# Patient Record
Sex: Female | Born: 1950 | Race: White | Hispanic: No | Smoking: Never smoker
Health system: Southern US, Community
[De-identification: ages and names within clinical notes are randomized; demographics above are authoritative.]

## PROBLEM LIST (undated history)

## (undated) DIAGNOSIS — M797 Fibromyalgia: Secondary | ICD-10-CM

## (undated) DIAGNOSIS — Z22322 Carrier or suspected carrier of Methicillin resistant Staphylococcus aureus: Secondary | ICD-10-CM

## (undated) DIAGNOSIS — R112 Nausea with vomiting, unspecified: Secondary | ICD-10-CM

## (undated) DIAGNOSIS — K529 Noninfective gastroenteritis and colitis, unspecified: Secondary | ICD-10-CM

## (undated) DIAGNOSIS — M1990 Unspecified osteoarthritis, unspecified site: Secondary | ICD-10-CM

## (undated) DIAGNOSIS — K219 Gastro-esophageal reflux disease without esophagitis: Secondary | ICD-10-CM

## (undated) DIAGNOSIS — Z9889 Other specified postprocedural states: Secondary | ICD-10-CM

## (undated) HISTORY — PX: BILATERAL OOPHORECTOMY: SHX1221

## (undated) HISTORY — PX: CARPAL TUNNEL RELEASE: SHX101

## (undated) HISTORY — PX: TONSILLECTOMY: SUR1361

## (undated) HISTORY — PX: APPENDECTOMY: SHX54

## (undated) HISTORY — DX: Unspecified osteoarthritis, unspecified site: M19.90

## (undated) HISTORY — DX: Carrier or suspected carrier of methicillin resistant Staphylococcus aureus: Z22.322

## (undated) HISTORY — PX: BACK SURGERY: SHX140

## (undated) HISTORY — PX: ABDOMINAL HYSTERECTOMY: SHX81

## (undated) HISTORY — PX: HERNIA REPAIR: SHX51

## (undated) HISTORY — PX: OTHER SURGICAL HISTORY: SHX169

## (undated) HISTORY — PX: ROTATOR CUFF REPAIR: SHX139

## (undated) HISTORY — PX: CERVICAL FUSION: SHX112

---

## 1998-12-08 ENCOUNTER — Ambulatory Visit (HOSPITAL_COMMUNITY): Admission: RE | Admit: 1998-12-08 | Discharge: 1998-12-08 | Payer: Self-pay | Admitting: Specialist

## 1998-12-08 ENCOUNTER — Encounter: Payer: Self-pay | Admitting: Specialist

## 2001-03-05 ENCOUNTER — Encounter: Payer: Self-pay | Admitting: Family Medicine

## 2001-03-05 ENCOUNTER — Ambulatory Visit (HOSPITAL_COMMUNITY): Admission: RE | Admit: 2001-03-05 | Discharge: 2001-03-05 | Payer: Self-pay | Admitting: Family Medicine

## 2001-05-20 ENCOUNTER — Emergency Department (HOSPITAL_COMMUNITY): Admission: EM | Admit: 2001-05-20 | Discharge: 2001-05-20 | Payer: Self-pay | Admitting: Internal Medicine

## 2001-08-19 ENCOUNTER — Encounter: Payer: Self-pay | Admitting: Family Medicine

## 2001-08-19 ENCOUNTER — Ambulatory Visit (HOSPITAL_COMMUNITY): Admission: RE | Admit: 2001-08-19 | Discharge: 2001-08-19 | Payer: Self-pay | Admitting: Family Medicine

## 2001-08-21 ENCOUNTER — Ambulatory Visit (HOSPITAL_COMMUNITY): Admission: RE | Admit: 2001-08-21 | Discharge: 2001-08-21 | Payer: Self-pay | Admitting: Family Medicine

## 2001-08-24 ENCOUNTER — Encounter: Payer: Self-pay | Admitting: Family Medicine

## 2002-12-24 ENCOUNTER — Ambulatory Visit (HOSPITAL_COMMUNITY): Admission: RE | Admit: 2002-12-24 | Discharge: 2002-12-24 | Payer: Self-pay | Admitting: Family Medicine

## 2002-12-24 ENCOUNTER — Encounter: Payer: Self-pay | Admitting: Family Medicine

## 2003-05-12 ENCOUNTER — Inpatient Hospital Stay (HOSPITAL_COMMUNITY): Admission: RE | Admit: 2003-05-12 | Discharge: 2003-05-16 | Payer: Self-pay | Admitting: Specialist

## 2003-07-18 ENCOUNTER — Encounter: Admission: RE | Admit: 2003-07-18 | Discharge: 2003-07-18 | Payer: Self-pay | Admitting: Specialist

## 2003-07-26 ENCOUNTER — Inpatient Hospital Stay (HOSPITAL_COMMUNITY): Admission: RE | Admit: 2003-07-26 | Discharge: 2003-07-27 | Payer: Self-pay | Admitting: Specialist

## 2003-08-29 ENCOUNTER — Inpatient Hospital Stay (HOSPITAL_COMMUNITY): Admission: AD | Admit: 2003-08-29 | Discharge: 2003-09-06 | Payer: Self-pay | Admitting: Specialist

## 2003-09-27 ENCOUNTER — Encounter: Admission: RE | Admit: 2003-09-27 | Discharge: 2003-09-27 | Payer: Self-pay | Admitting: Internal Medicine

## 2003-09-28 ENCOUNTER — Ambulatory Visit (HOSPITAL_COMMUNITY): Admission: RE | Admit: 2003-09-28 | Discharge: 2003-09-28 | Payer: Self-pay | Admitting: Internal Medicine

## 2003-10-12 ENCOUNTER — Ambulatory Visit (HOSPITAL_COMMUNITY): Admission: RE | Admit: 2003-10-12 | Discharge: 2003-10-12 | Payer: Self-pay | Admitting: Specialist

## 2003-11-08 ENCOUNTER — Ambulatory Visit: Payer: Self-pay | Admitting: Internal Medicine

## 2003-12-28 ENCOUNTER — Ambulatory Visit (HOSPITAL_COMMUNITY): Admission: RE | Admit: 2003-12-28 | Discharge: 2003-12-28 | Payer: Self-pay | Admitting: Family Medicine

## 2004-01-17 ENCOUNTER — Ambulatory Visit: Payer: Self-pay | Admitting: Internal Medicine

## 2004-05-22 ENCOUNTER — Encounter: Admission: RE | Admit: 2004-05-22 | Discharge: 2004-05-22 | Payer: Self-pay | Admitting: Neurosurgery

## 2004-06-20 ENCOUNTER — Encounter: Admission: RE | Admit: 2004-06-20 | Discharge: 2004-06-20 | Payer: Self-pay | Admitting: Specialist

## 2004-07-11 ENCOUNTER — Ambulatory Visit: Payer: Self-pay | Admitting: Infectious Diseases

## 2004-07-11 ENCOUNTER — Inpatient Hospital Stay (HOSPITAL_COMMUNITY): Admission: EM | Admit: 2004-07-11 | Discharge: 2004-07-16 | Payer: Self-pay | Admitting: Specialist

## 2004-07-11 ENCOUNTER — Ambulatory Visit (HOSPITAL_COMMUNITY): Admission: RE | Admit: 2004-07-11 | Discharge: 2004-07-11 | Payer: Self-pay | Admitting: Specialist

## 2004-09-06 ENCOUNTER — Ambulatory Visit: Payer: Self-pay | Admitting: Internal Medicine

## 2004-09-25 ENCOUNTER — Ambulatory Visit: Payer: Self-pay | Admitting: Internal Medicine

## 2005-03-10 IMAGING — CT CT GUIDANCE NEEDLE PLACEMENT
2 of 6 series · 6 of 14 positions shown, 7 images · non-contrast
Comparison: Prior MRI of the lumbar spine performed at [REDACTED] dated 09/26/03.

CLINICAL DATA: The patient is status post lumbar fusion surgery complicated by Staph infection and has had subsequent incision and drainage and long-term antibiotic therapy.  Follow-up MRI has demonstrated persistent small fluid collection immediately posterior to the right iliac bone at the site of bone graft harvesting.  She now presents for aspiration of this region. 
 CT-GUIDED NEEDLE ASPIRATION OF SOFT TISSUE ADJACENT TO RIGHT ILIAC BONE 10/12/03

[Series 3: — · axial · 0.90mm/px · z∈[-452,-362]mm · 3 of 38 slices shown, 4 images (1 of 2)]
[im 10/38  soft-tissue]
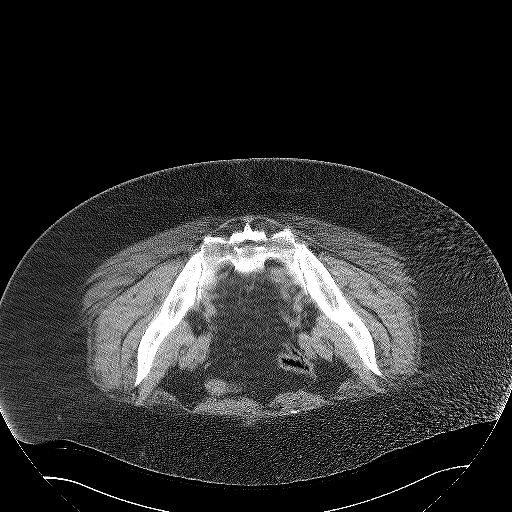
[im 10/38  bone]
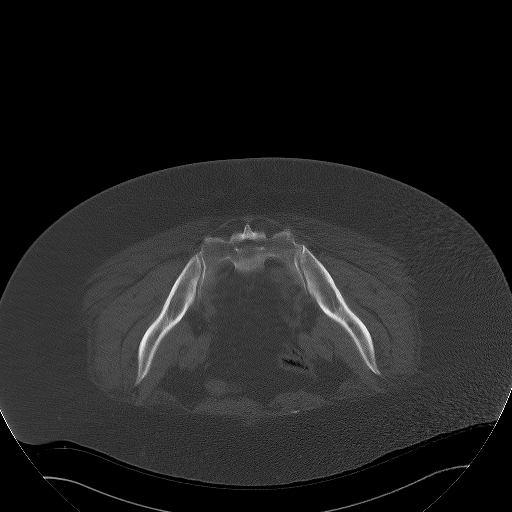
[im 19/38  bone]
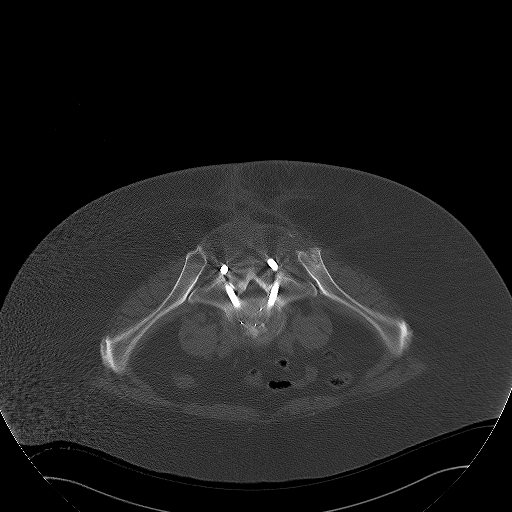
[im 28/38  bone]
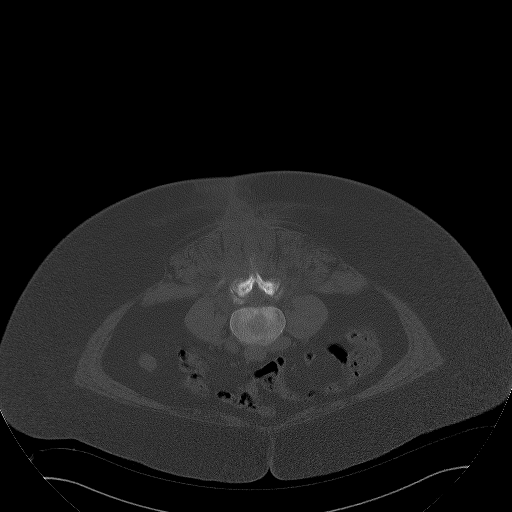

[Series 4: — · axial · 0.90mm/px · z∈[-452,-362]mm · 3 of 37 slices shown (2 of 2)]
[im 10/37  bone]
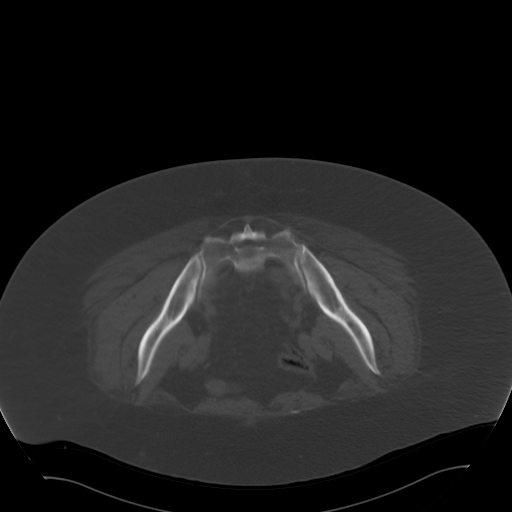
[im 19/37  bone]
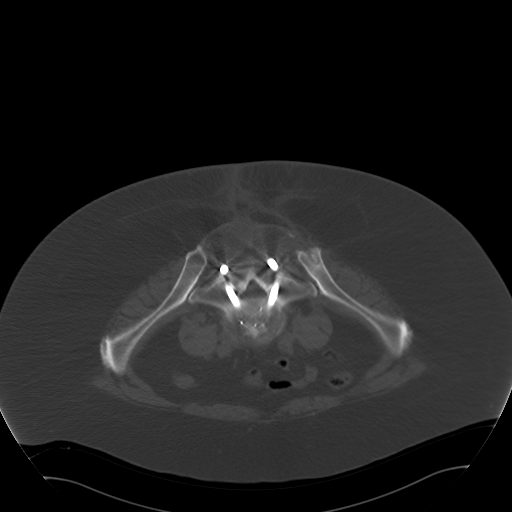
[im 28/37  bone]
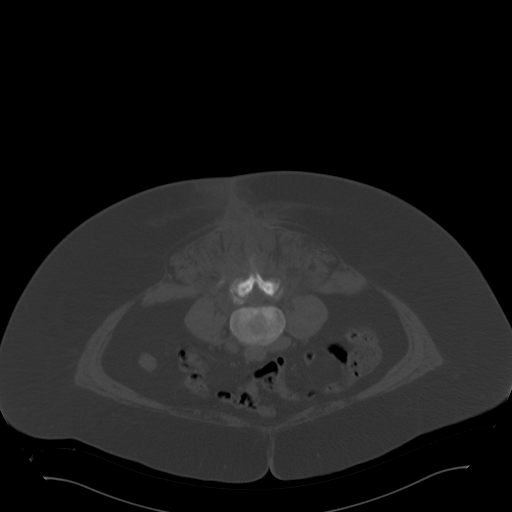

[6 of 14 positions shown; findings below may reference images not displayed]

The patient was placed in a prone position and preliminary unenhanced CT performed from the L3 level to the lower pelvis.  After choosing a site for the procedure, skin overlying the right posterior paraspinous region was sterilely prepped and draped.  Local anesthesia was provided with 1% lidocaine.  
 An 18 gauge trocar needle was advanced under CT guidance to the level of soft tissue abnormality just posterior to the right iliac bone.  After confirming needle tip position, aspiration was performed, and the needle was moved several times in this region during aspiration.  Saline lavage was then performed with 4 cc of sterile saline.  This yielded lavage of approximately 2 cc which was sent for culture and gram stain. 
 Complications:  None.
FINDINGS: Initial imaging shows a large amount of soft tissue scarring in the midline posterior to previous lumbar fusion at L5-S1 secondary to multiple surgeries.  Within this area of scarring, there is suggestion of a potentially tiny amount of residual fluid most likely representing postoperative seroma.  The largest dimensions of any visible potential fluid are only approximately 11 mm by CT and does appear smaller in size when compared to the MRI of 09/26/03. 
 At the level of the right iliac bone harvesting site, there is some soft tissue thickening and scarring present posteriorly.  On initial CT, there did not appear to be any significant fluid in this region, but given some residual asymmetric soft tissue thickening as well as prior MRI findings, aspiration was pursued.  With advancement of an 18 gauge needle into this area, there was no return of free fluid with aspiration.  Saline lavage was therefore performed. 
 IMPRESSION
 18 gauge needle aspiration at the level of asymmetric residual soft tissue inflammation posterior to the right iliac bone harvesting site was performed yielding no free fluid.  Saline lavage was performed and sample sent for culture.  The midline subcutaneous postoperative fluid has decreased in size since the 09/26/03 MRI with only tiny areas of residual fluid remaining measuring just over 1 cm in maximum diameter.

## 2008-06-20 ENCOUNTER — Ambulatory Visit (HOSPITAL_COMMUNITY): Admission: RE | Admit: 2008-06-20 | Discharge: 2008-06-20 | Payer: Self-pay | Admitting: Surgery

## 2008-06-30 ENCOUNTER — Ambulatory Visit (HOSPITAL_COMMUNITY): Admission: RE | Admit: 2008-06-30 | Discharge: 2008-06-30 | Payer: Self-pay | Admitting: Surgery

## 2008-07-13 ENCOUNTER — Encounter: Admission: RE | Admit: 2008-07-13 | Discharge: 2008-07-13 | Payer: Self-pay | Admitting: Surgery

## 2008-09-26 ENCOUNTER — Encounter: Admission: RE | Admit: 2008-09-26 | Discharge: 2008-12-25 | Payer: Self-pay | Admitting: Surgery

## 2008-12-12 ENCOUNTER — Ambulatory Visit (HOSPITAL_COMMUNITY): Admission: RE | Admit: 2008-12-12 | Discharge: 2008-12-12 | Payer: Self-pay | Admitting: Surgery

## 2008-12-12 HISTORY — PX: LAPAROSCOPIC GASTRIC BANDING: SHX1100

## 2008-12-26 ENCOUNTER — Encounter: Admission: RE | Admit: 2008-12-26 | Discharge: 2009-03-01 | Payer: Self-pay | Admitting: Surgery

## 2009-02-14 ENCOUNTER — Ambulatory Visit (HOSPITAL_COMMUNITY): Admission: RE | Admit: 2009-02-14 | Discharge: 2009-02-14 | Payer: Self-pay | Admitting: Surgery

## 2009-11-21 ENCOUNTER — Inpatient Hospital Stay (HOSPITAL_COMMUNITY): Admission: EM | Admit: 2009-11-21 | Discharge: 2009-11-22 | Payer: Self-pay | Admitting: Emergency Medicine

## 2009-11-27 ENCOUNTER — Emergency Department (HOSPITAL_COMMUNITY): Admission: EM | Admit: 2009-11-27 | Discharge: 2009-11-27 | Payer: Self-pay | Admitting: Emergency Medicine

## 2009-12-04 ENCOUNTER — Encounter: Admission: RE | Admit: 2009-12-04 | Discharge: 2009-12-04 | Payer: Self-pay | Admitting: Surgery

## 2009-12-19 ENCOUNTER — Ambulatory Visit (HOSPITAL_COMMUNITY): Admission: RE | Admit: 2009-12-19 | Discharge: 2009-12-19 | Payer: Self-pay | Admitting: Surgery

## 2010-01-02 ENCOUNTER — Inpatient Hospital Stay (HOSPITAL_COMMUNITY): Admission: AD | Admit: 2010-01-02 | Discharge: 2010-01-06 | Payer: Self-pay | Admitting: Surgery

## 2010-01-02 HISTORY — PX: OTHER SURGICAL HISTORY: SHX169

## 2010-01-04 ENCOUNTER — Encounter: Admission: RE | Admit: 2010-01-04 | Payer: Self-pay | Admitting: Surgery

## 2010-03-24 ENCOUNTER — Encounter: Payer: Self-pay | Admitting: Surgery

## 2010-05-15 LAB — COMPREHENSIVE METABOLIC PANEL
AST: 22 U/L (ref 0–37)
Albumin: 3.6 g/dL (ref 3.5–5.2)
Alkaline Phosphatase: 69 U/L (ref 39–117)
Chloride: 108 mEq/L (ref 96–112)
GFR calc Af Amer: >60 mL/min (ref >60)
Potassium: 3.9 mEq/L (ref 3.5–5.1)
Total Bilirubin: 1.1 mg/dL (ref 0.3–1.2)

## 2010-05-15 LAB — CBC
HCT: 39.5 % (ref 36.0–46.0)
Hemoglobin: 13 g/dL (ref 12.0–15.0)
MCH: 30.7 pg (ref 26.0–34.0)
MCH: 30.8 pg (ref 26.0–34.0)
MCHC: 33.9 g/dL (ref 30.0–36.0)
MCHC: 34.3 g/dL (ref 30.0–36.0)
Platelets: 176 10*3/uL (ref 150–400)
Platelets: 216 10*3/uL (ref 150–400)
RBC: 4.48 MIL/uL (ref 3.87–5.11)
RDW: 13.7 % (ref 11.5–15.5)
RDW: 13.7 % (ref 11.5–15.5)
WBC: 8.1 10*3/uL (ref 4.0–10.5)

## 2010-05-15 LAB — DIFFERENTIAL
Basophils Absolute: 0 10*3/uL (ref 0.0–0.1)
Basophils Absolute: 0 10*3/uL (ref 0.0–0.1)
Basophils Absolute: 0 10*3/uL (ref 0.0–0.1)
Basophils Relative: 0 % (ref 0–1)
Basophils Relative: 0 % (ref 0–1)
Basophils Relative: 0 % (ref 0–1)
Eosinophils Absolute: 0 10*3/uL (ref 0.0–0.7)
Eosinophils Absolute: 0.2 10*3/uL (ref 0.0–0.7)
Eosinophils Relative: 1 % (ref 0–5)
Lymphocytes Relative: 34 % (ref 12–46)
Monocytes Absolute: 0.5 10*3/uL (ref 0.1–1.0)
Monocytes Absolute: 0.5 10*3/uL (ref 0.1–1.0)
Monocytes Relative: 6 % (ref 3–12)
Monocytes Relative: 7 % (ref 3–12)
Neutro Abs: 5.6 10*3/uL (ref 1.7–7.7)
Neutro Abs: 9.2 10*3/uL — ABNORMAL HIGH (ref 1.7–7.7)
Neutrophils Relative %: 68 % (ref 43–77)

## 2010-05-16 LAB — SURGICAL PCR SCREEN
MRSA, PCR: NEGATIVE
Staphylococcus aureus: POSITIVE — AB

## 2010-05-17 LAB — DIFFERENTIAL
Basophils Absolute: 0 10*3/uL (ref 0.0–0.1)
Basophils Absolute: 0 10*3/uL (ref 0.0–0.1)
Basophils Absolute: 0 10*3/uL (ref 0.0–0.1)
Basophils Relative: 0 % (ref 0–1)
Basophils Relative: 0 % (ref 0–1)
Eosinophils Relative: 7 % — ABNORMAL HIGH (ref 0–5)
Lymphocytes Relative: 13 % (ref 12–46)
Lymphocytes Relative: 22 % (ref 12–46)
Lymphs Abs: 1.6 10*3/uL (ref 0.7–4.0)
Monocytes Absolute: 0.8 10*3/uL (ref 0.1–1.0)
Monocytes Relative: 6 % (ref 3–12)
Neutro Abs: 4.4 10*3/uL (ref 1.7–7.7)
Neutro Abs: 5.4 10*3/uL (ref 1.7–7.7)
Neutro Abs: 8.9 10*3/uL — ABNORMAL HIGH (ref 1.7–7.7)
Neutrophils Relative %: 48 % (ref 43–77)
Neutrophils Relative %: 55 % (ref 43–77)

## 2010-05-17 LAB — URINALYSIS, ROUTINE W REFLEX MICROSCOPIC
Bilirubin Urine: NEGATIVE
Bilirubin Urine: NEGATIVE
Ketones, ur: 15 mg/dL — AB
Nitrite: NEGATIVE
Nitrite: NEGATIVE
Protein, ur: NEGATIVE mg/dL
Protein, ur: NEGATIVE mg/dL
Urobilinogen, UA: 0.2 mg/dL (ref 0.0–1.0)

## 2010-05-17 LAB — CBC
HCT: 33.1 % — ABNORMAL LOW (ref 36.0–46.0)
HCT: 34.6 % — ABNORMAL LOW (ref 36.0–46.0)
HCT: 40 % (ref 36.0–46.0)
Hemoglobin: 11.4 g/dL — ABNORMAL LOW (ref 12.0–15.0)
MCHC: 33.8 g/dL (ref 30.0–36.0)
MCHC: 34.5 g/dL (ref 30.0–36.0)
MCV: 89 fL (ref 78.0–100.0)
Platelets: 200 10*3/uL (ref 150–400)
Platelets: 222 10*3/uL (ref 150–400)
RBC: 4.5 MIL/uL (ref 3.87–5.11)
RDW: 13.3 % (ref 11.5–15.5)
RDW: 13.4 % (ref 11.5–15.5)
WBC: 11.9 10*3/uL — ABNORMAL HIGH (ref 4.0–10.5)
WBC: 8.1 10*3/uL (ref 4.0–10.5)

## 2010-05-17 LAB — BASIC METABOLIC PANEL
BUN: 17 mg/dL (ref 6–23)
BUN: 7 mg/dL (ref 6–23)
CO2: 25 mEq/L (ref 19–32)
Calcium: 8.5 mg/dL (ref 8.4–10.5)
Calcium: 8.5 mg/dL (ref 8.4–10.5)
Chloride: 104 mEq/L (ref 96–112)
Chloride: 112 mEq/L (ref 96–112)
Creatinine, Ser: 0.71 mg/dL (ref 0.4–1.2)
Creatinine, Ser: 0.95 mg/dL (ref 0.4–1.2)
GFR calc Af Amer: >60 mL/min (ref >60)
GFR calc Af Amer: >60 mL/min (ref >60)
GFR calc non Af Amer: >60 mL/min (ref >60)
GFR calc non Af Amer: >60 mL/min (ref >60)
Glucose, Bld: 117 mg/dL — ABNORMAL HIGH (ref 70–99)
Glucose, Bld: 94 mg/dL (ref 70–99)
Potassium: 3.8 mEq/L (ref 3.5–5.1)
Potassium: 3.8 mEq/L (ref 3.5–5.1)
Sodium: 142 mEq/L (ref 135–145)

## 2010-05-17 LAB — PHOSPHORUS: Phosphorus: 3 mg/dL (ref 2.3–4.6)

## 2010-05-17 LAB — LIPID PANEL
Cholesterol: 180 mg/dL (ref 0–200)
LDL Cholesterol: 114 mg/dL — ABNORMAL HIGH (ref 0–99)
Triglycerides: 72 mg/dL (ref <150)

## 2010-05-17 LAB — STOOL CULTURE

## 2010-05-17 LAB — CLOSTRIDIUM DIFFICILE EIA

## 2010-05-17 LAB — OVA AND PARASITE EXAMINATION

## 2010-05-17 LAB — MAGNESIUM
Magnesium: 1.6 mg/dL (ref 1.5–2.5)
Magnesium: 1.8 mg/dL (ref 1.5–2.5)

## 2010-05-17 LAB — HEPATIC FUNCTION PANEL
ALT: 14 U/L (ref 0–35)
AST: 19 U/L (ref 0–37)
Alkaline Phosphatase: 102 U/L (ref 39–117)
Total Protein: 6.6 g/dL (ref 6.0–8.3)

## 2010-05-17 LAB — URINE CULTURE

## 2010-05-17 LAB — TSH: TSH: 1.546 u[IU]/mL (ref 0.350–4.500)

## 2010-06-05 LAB — ANAEROBIC CULTURE

## 2010-06-05 LAB — WOUND CULTURE

## 2010-06-07 LAB — COMPREHENSIVE METABOLIC PANEL
ALT: 29 U/L (ref 0–35)
Albumin: 4.2 g/dL (ref 3.5–5.2)
Alkaline Phosphatase: 62 U/L (ref 39–117)
Potassium: 3.9 mEq/L (ref 3.5–5.1)
Sodium: 140 mEq/L (ref 135–145)
Total Protein: 7.4 g/dL (ref 6.0–8.3)

## 2010-06-07 LAB — DIFFERENTIAL
Basophils Relative: 1 % (ref 0–1)
Eosinophils Absolute: 0 10*3/uL (ref 0.0–0.7)
Monocytes Absolute: 0.4 10*3/uL (ref 0.1–1.0)
Monocytes Relative: 5 % (ref 3–12)

## 2010-06-07 LAB — CBC
Hemoglobin: 14.4 g/dL (ref 12.0–15.0)
Platelets: 216 10*3/uL (ref 150–400)
RDW: 13.9 % (ref 11.5–15.5)

## 2010-07-20 NOTE — Discharge Summary (Signed)
NAMETAGEN, BRETHAUER              ACCOUNT NO.:  [id number]   MEDICAL RECORD NO.:  [id number]          PATIENT TYPE:  INP   LOCATION:  1513                         FACILITY:  [facility]   PHYSICIAN:  Kerrin Champagne, M.D.   DATE OF BIRTH:  [birth year]   DATE OF ADMISSION:  07/11/2004  DATE OF DISCHARGE:  07/16/2004                                 DISCHARGE SUMMARY   ADMISSION DIAGNOSES:  1.  Abscess left L5-S1 level, possible osteomyelitis, right L4-5 was sent  2.  Pyarthrosis, left shoulder..  3.  Diet-controlled diabetes mellitus.  4.  Remote history anterior cervical diskectomy and fusion C7-T1.  5.  Bilateral epidural condyle release of bilateral elbows.  6.  Right rotator cuff repair in 2002.  7.  Left shoulder arthroscopy in 2006.   DISCHARGE DIAGNOSES:  1.  Methicillin-resistant Staphylococcus aureus infection at the L5-S1      level.  2.  Pyarthrosis, left shoulder, with methicillin-resistant Staphylococcus      aureus.  3.  Diet-controlled diabetes mellitus.  4.  Remote history of anterior cervical diskectomy and fusion C7-T1.  5.  Bilateral epidural condyle release of bilateral elbows.  6.  Right rotator cuff repair in 2002.  7.  Left shoulder arthroscopy in 2006.   PROCEDURES:  1.  On Jul 12, 2004, the patient underwent incision irrigation and      excisional debridement of L5-S1 abscess with removal of loosened      hardware pedicle screws and rods at the L5-S1 level.  This was performed      by Dr. Otelia Sergeant, assisted by Maud Deed, PA-C under general anesthesia.  2.  On Jul 12, 2004, by Dr. Rennis Chris, assisted by Shelbie Proctor, PA-C,      includes left shoulder glenohumeral joint diagnostic arthroscopy as well      as arthroscopic lavage and synovectomy of left shoulder glenohumeral      joint.  Arthroscopic subacromial bursectomy with debridement of rotator      cuff tear under general anesthesia as well.   HISTORY OF PRESENT ILLNESS:  Ms. 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  Her postoperative course was complicated with  wound dehiscence and fat necrosis treated with initial frequent dressing  changes and eventually excision and closure.  The patient developed an  epidural abscess which was drained and did grow methicillin-resistant  Staphylococcus aureus.  The patient was treated again with incision,  irrigation and debridement with closure of her drains and use of IV  antibiotics.  Unfortunately, she developed a reaction to IV vancomycin and  was unable to use this antibiotic.  She was treated with doxycycline and  rifampin.  Abscess was felt to be eradicated.  In the interval, she also  underwent a left shoulder procedure for debridement of distal clavicle and  rotator cuff repair arthroscopically by Dr. Rennis Chris in February 2006.  Over  the past 6 weeks, the patient has developed increasing low back pain as well  as elevated sedimentation rate rising as high  as 90.  MRI studies have  demonstrated facet changes at the right L4-L5 level possibly consistent with  osteomyelitis and myelitis were facet pyarthrosis. Attempts at aspiration  were unsuccessful and the cultures were negative.  Over the past 7-10 days,  the patient has had increasing left shoulder pain.  In the office, on the  day of admission, she was noted to have increasing pain of the left shoulder  with flexion once. She underwent aspiration of the left shoulder and  purulent material was aspirated.  She was admitted immediately to the  hospital for a IV antibiotic treatment and irrigation and debridement of the  shoulder as well as the lumbar spine.   HOSPITAL COURSE:  Upon admission, the patient was taken to the operating  room that was the procedure as stated above.  The patient did have MRI scan  of the left shoulder prior to her surgical intervention.  Both procedures  left drains  sewn into the affected areas.  On postop day #1, the patient was  noted to have a decreased hemoglobin to 8.6 and did require 2 units of  packed red blood cells.  She responded nicely to the transfusion with no  complications.  Chemistry studies were intact postoperatively.  Infectious  disease consult was obtained and the patient was seen by Dr. Roxan Hockey.  Doxycycline with rifampin x1 year was recommended.  Through the next couple  of days, her drain was monitored closely.  The drain from the left shoulder  was discontinued on May 13, as there was no further drainage.  The drain of  the back remained intact until May 15, at which time it was removed as well.  Dressing changes were done daily both of the shoulder and of the lumbar  spine incision.  The patient's antibiotics were monitored by the infectious  disease team and the patient tolerated the combination of doxycycline and  rifampin without complications during the hospital stay.  On Jul 16, 2004,  the patient was comfortable.  She was ambulating in hallway.  The patient  did have low-grade fevers during the hospital stay, but these were felt to  be stable.  Her antibiotics were changed to oral doxycycline 1 mg b.i.d. and  rifampin 300 mg b.i.d.  She was felt to be stable for discharge to her home  on Jul 16, 2004.   LABORATORY DATA AND X-RAY FINDINGS:  Admission CBC with hemoglobin 9.4,  hematocrit 28.1.  Admission white blood count 12.5.  On May 15, at  discharge, WBC 11.4, hemoglobin 9.8, hematocrit 29.2.  Sedimentation rate on  May 10, was 120.  Coagulation study on admission normal with exception of  mildly elevated PTT of 39.  Chemistry studies throughout the hospital stay  were found to be normal with the exception of admission, albumin of 2.4.  Blood sugars ranged from 105-163 during the hospital stay.  Urinalysis on  admission showed moderate leukocyte esterase, few epithelial cells, 7-10 wbc's and few bacteria.  Yeast also  noted in the urinalysis.  Cultures from  the lumbar spine grew methicillin-resistant Staphylococcus aureus.  This  particular culture came from the pedicle at L5.  Remaining cultures showed  no growth.  No anaerobes were isolated.  The patient received a total of 2  units of packed red blood cells during the hospital stay.   Admission EKG was within normal limits.  MRI scan of left shoulder with  recurrent full-thickness tear of the supraspinatus tendon with no signs of  osteomyelitis.  Infected fluid in the left glenohumeral joint freely  communicating with fluid in the subacromial subdeltoid bursa noted.  Fluid  was noted to extend and through the defect in the distal clavicle and small  soft tissue abscess superior to the [facility] joint.  MRI of lumbar spine with end-  plate edema and irregularity at the L5-S1 have progressed from the patient's  prior MRI of the August 29, 2003, but did not appear obviously changed from  the CT scan of 6 weeks prior to this study.  This was felt to be related to  ongoing infection.  Chest x-ray on admission showed mild cardiomegaly, small  left pleural effusion.  X-ray of lumbar spine on the day of discharge showed  ill definition of the vertebral end-plates of W1-X9 level, associated mild  anterolisthesis L5-S1.   DISPOSITION:  Arrangements were made for the patient to be discharged to  home today.   DISCHARGE MEDICATIONS:  1.  Percocet 5/325 one to two every 4-6 hours as needed for pain.  2.  Robaxin 500 mg every 8 hours as needed for spasm.  3.  Cymbalta 40 mg 1 tablet q.h.s.  4.  Doxycycline 100 mg one p.o. q.12h.  5.  Rifampin 300 mg one p.o. q.12h.   WOUND CARE:  The patient will change her dressing of the lumbar spine in  approximately 2-3 days and will be allowed to shower when there is no  drainage.   DIET:  She will remain on low carbohydrate diet.   FOLLOW UP:  The patient will follow up in Dr. Barbaraann Faster office in 4-5 days and  has been  advised to call to make the appointment.  She will also follow up  with Dr. supple approximately 10-14 days for recheck.   CONDITION ON DISCHARGE:  Stable.       SMV/MEDQ  D:  09/10/2004  T:  09/10/2004  Job:  [id number]

## 2010-07-20 NOTE — Op Note (Signed)
NAME:  Natalie Dodson, Natalie Dodson                        ACCOUNT NO.:  [id number]   MEDICAL RECORD NO.:  [id number]                   PATIENT TYPE:  INP   LOCATION:  3027                                 FACILITY:  [facility]   PHYSICIAN:  Kerrin Champagne, M.D.                DATE OF BIRTH:  Jul 08, 1950   DATE OF PROCEDURE:  DATE OF DISCHARGE:                                 OPERATIVE REPORT   DATE OF OPERATION:  September 02, 2003.   PREOPERATIVE DIAGNOSIS:  Epidural abscess, L5-S1, status post incision,  irrigation, and debridement x2.   POSTOPERATIVE DIAGNOSIS:  Epidural abscess, L5-S1, status post incision,  irrigation, and debridement x2.   PROCEDURE:  Repeat irrigation and debridement of L5-S1 epidural abscess with  delayed primary closure over 15-French Hemovac drain.   SURGEON:  Vira Browns, MD   ASSISTANT:  Maud Deed, PA-C.   ANESTHESIA:  GOT, Dr. Diamantina Monks.   ESTIMATED BLOOD LOSS:  100 mL.   DRAINS:  Two 15-French drains to Hemovac reservoir.   HISTORY OF PRESENT ILLNESS:  The patient is a 60 year old female, who  developed progressive worsening night fever and chills over the past 2  weeks.  She presented to our office on Monday, 5 days ago, with fever of  103, complaining of back pain with radiation to the buttocks and thighs.  She is status post L5-S1 decompression and fusion in March of this year.  She had wound dehiscence postoperatively, was treated with dressing changes  locally, and returned to the operating room at some five weeks post-dressing  changes and had a primary wound excision and closure.  Following closure,  she went on to heal the incision and returned to work 1 week prior to her  presentation with increased fevers.  Was seen in the office, evaluated, and  felt to most likely have an infection at the site of her surgery.  She was  admitted and underwent MRI scan, which demonstrated an epidural abscess at  the L5-S1 level and some reactive changes extending all  the way up to L2.  The patient underwent immediate irrigation and debridement that evening.  Her sedimentation rate was 80.  Postoperatively, she defervesced and has  shown diminished thigh and leg discomfort.  She was returned to the  operating room 2 days later and underwent a repeat irrigation and  debridement and was found to have another small area of purulence, so the  wound was left open and packed, and returns today for a second repeat  irrigation, debridement, and possible irrigation and then closure over  drains.   INTRAOPERATIVE FINDINGS:  No significant areas of purulence.  There is a  small area potentially over the right side L5-S1, but nothing as compared to  her previous incision's irrigation and debridement.  She therefore underwent  irrigation and debridement locally and then closure over drains.   DESCRIPTION OF PROCEDURE:  After adequate general  anesthesia, the patient  rolled into a prone position on chest rolls, all pressure points well  padded.  Standard prep with Betadine scrub and prep solution and draped in  the usual manner.  Iodine Vi-Drape was used.  The patient is on Vancomycin  for methicillin-resistance staph aureus abscess.  She had opening of the  incision widely.  A McCullough retractor was inserted.  Under loop  magnification then, the areas surrounding the thecal sac at the L5-S1 level  were carefully opened and exposed using a D'Errico retractor, retracting the  nerve root, thecal sac, and examining the L5-S1 level where a small amount  of purulence noted on the right side, perhaps a cubic centimeter at the most  noted.  This was quickly irrigated.  The left side thecal sac evaluated and  showed no sign of purulence, and this was exposed all the way down to the L5-  S1 disk space on the left.  Irrigation and debridement was carried out, with  small areas of loose debris removed from the incision, which amounted to  just a small amount of debris.   Some taken from the region of the right  sacral pedicle just inferior to it where there was some small amount of  discolored, soft tissue, which was debrided using a bare rongeur.  Irrigation was performed.  Overall, the soft tissue showed granulation that  was quite nice and almost 99 or 97% granulated throughout the wound.  Irrigation was performed to the tune of 3 liters of lactated Ringer's  solution.  Additionally, some 250 mL of double antibiotic solution was used  to irrigate the incision.  #10-French Hemovac drains were then placed over  the right iliac crest entering into the lumbar area, and these were passed  on the right and left side of the spinous process of L3-L4 and over the  laminotomy defect at L5-S1.  The paralumbar muscles were then approximated  loosely over the L5-S1 level using interrupted #1 Vicryl sutures.  The  lumbodorsal fascia approximated loosely over the midline from L3 to S1.  Then the deep fatty layers approximated loosely with interrupted #1 Vicryl  sutures,  the more superficial layers with interrupted #0 Vicryl sutures,  and then the skin was closed with interrupted vertical mattress sutures of 2-  0 Prolene.  Adaptic, 4x4s, ABD pads affixed to the skin with paper tape.  The 2 drains on the right side were then sewn in place using 2-0 nylon  suture.  The patient was then returned to a supine position, reactivated,  extubated, and returned to the recovery room in satisfactory condition.  All  instrument and sponge counts were correct.                                               Kerrin Champagne, M.D.    Myra Rude  D:  09/02/2003  T:  09/04/2003  Job:  [id number]

## 2010-07-20 NOTE — Op Note (Signed)
NAME:  Natalie Dodson, Natalie Dodson                        ACCOUNT NO.:  [id number]   MEDICAL RECORD NO.:  [id number]                   PATIENT TYPE:  INP   LOCATION:  5004                                 FACILITY:  [facility]   PHYSICIAN:  Kerrin Champagne, M.D.                DATE OF BIRTH:  [birth year]   DATE OF PROCEDURE:  05/12/2003  DATE OF DISCHARGE:                                 OPERATIVE REPORT   PREOPERATIVE DIAGNOSES:  1. Painful spondylolisthesis at L5-S1.  2. 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  3. Right carpal tunnel syndrome.   POSTOPERATIVE DIAGNOSES:  1. 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  2. Bilateral lateral recess stenosis, moderately severe, at the L4-5 level     affecting the L5 nerve roots.  3. Right carpal tunnel syndrome with median nerve entrapment secondary to     hypertrophic changes involving the patient's transverse carpal ligament.   SURGEON:  Kerrin Champagne, M.D.   ANESTHESIA:  General anesthesia.   HISTORY OF PRESENT ILLNESS:  The patient is a 60 year old female who has had  previous cervical spine surgery for a herniated nucleus pulposus at the C5-  C6 level.  She has had increasing pain into her hands.  She underwent  evaluation in early fall with studies indicating multilevel foraminal  narrowing involving the spinal canal most prominent at the C7-T1 level.  Her  pain distribution, however, is significant at the median nerve distribution  or C6 level.  The level of previous C5-6 fusion, however, demonstrated some  very mild left sided uncovertebral findings.  Her pain was primarily right  sided.  The patient underwent EMG's and nerve conduction studies which  returned demonstrating mild carpal tunnel syndrome on the right side.  This  was treated conservatively with splinting and anti-inflammatory  agents with  persistent night pain typical to her recurring carpal tunnel complaints in  the right Bink.  While being treated for her neck and her carpal tunnel, the  patient developed severe back pain, claudication and difficulty with  standing and walking any distance.  She underwent evaluation including an  MRI study which demonstrated a grade 1 spondylolisthesis at L5-S1 and  degenerative disk change above this segment involving the L2-3, L1-2,  L3-4  and L4-5 levels. She was found to have significant lateral recess stenosis  at L4-5 and was also found to have L5 nerve root entrapment secondary to an  isthmic spondylolisthesis and entrapment within the foramen here.  Because  of persistent pain, the inability to relieve her leg pain and the  persistence of night pain, the necessity of narcotics to help sleep and  failure of conservative management including selective nerve root block to  relieve her pain for any significant  length of time, she is brought to the  operating room to undergo surgical treatment for the isthmic  spondylolisthesis at L5-S1 with decompression of the lateral recess stenosis  at the L4-5 level.  The patient also is to undergo carpal tunnel release as  she is undergoing a general anesthetic and prefers to undergo this surgery  at this time.   INTRAOPERATIVE FINDINGS:  The patient is found to have significant  hypertrophic changes involving the transverse carpal ligament of the right  wrist affecting the right median nerve causing an hourglass deformity and  decompression of the nerve at the level of the carpal canal.  The patient  within the lumbar spine was found to have significant grade 1  spondylolisthesis at L5-S1 with bilateral L5 nerve root entrapment, right  side greater than left, secondary to hypertrophic changes occurring at the  level of the pars defect and bilateral lateral recess stenosis affecting the  L5 nerve root at the L4 level, right side  greater than left, as well.   DESCRIPTION OF PROCEDURE:  After adequate general anesthesia the patient in  a prone position, chest rolls on the [place] table, all pressure points well  padded, the legs wrapped and monitors placed for intraoperative monitoring  of the EMG's as well as for tissue resistance monitoring with insertion of  pedicle screws.  The patient was found not to have her MRI study available  so that surgery was first proceeded upon with the right upper extremity  while attempts were made to locate the patient's MRI studies which were at  [facility].  These were eventually found, located and brought to  the operating room shortly after performance of carpal tunnel release.  The  right upper extremity was prepped with Duraprep solution and the right Kosik  and fingertips to the right elbow, a tourniquet about the right upper arm  and draped in the usual manner.  Duraprep was used for prep here.  Standard  preoperative antibiotics with Ancef. An incision approximately an inch and a  half in length, 2 mm ulnar to the thenar imminence crease, crossing at the  distal wrist creases obliquely.  Through the skin and subcutaneous layer it  was carried down to the level of the transverse carpal ligament distally and  the distal portion fo the forearm fascia.  The distal foreman fascia was  incised using the sharp scissors and the median nerve identified using  retraction of the skin, double ended retractors and Ragnell retractors.  A  Freer elevator was placed beneath the transverse carpal ligament and the  distal volar forearm fascia was then incised and carried to the level fo the  transverse carpal ligament palpating the carpal canal.  The extent of the  canal was identified and an incision made directly across the transverse  carpal ligament overlying the Therapist, nutritional.  This was done releasing the transverse carpal ligament and noting the impression on the underlying   median nerve.  Scissors were then used to continue the dissection incising  the transverse carpal ligament out to the level of the palmar fascia and  this was carried out to the level of the superficial arch of the Mcmichael.  Elevating the skin proximally, then the distal portion of the forearm fascia  was incised underneath the skin edges upwards to an area where no further  compression was noted on the median nerve.  The tourniquet was then  released.  Bleeders were controlled using bipolar electrocautery.  The  patient was noted to have a small amount of oozing from soft tissue present  so that a 7 [place] TLS drain was placed.  The skin edges were then closed  with interrupted horizontal mattress sutures of 4-0 nylon.  Adaptic and 4 x  4's were fixed to the skin with sterile Webril and the ace wrap was then  applied.  The TLS drain charged and the patient placed into a forearm  splint.  Carefully, the elbow and the patient's shoulder were evaluated  following the surgery as the surgery was performed with the patient in full  internal rotation of the right arm as she was in a prone position to allow  for carpal tunnel release.  Examination of the shoulder and elbow post  carpal tunnel release demonstrated no abnormality.  Note that a tourniquet  was used during the procedure.  The arm was elevated and exsanguinated with  an Esmarch bandage and the tourniquet inflated to 250 mmHg.  This remained  so for 17 minutes during the case.  The tourniquet was removed in its  entirety at the end of the case.   Attention was then turned to the patient's lumbar spine.  Duraprep was used  to prep from the lower dorsal spine to the S3 level, draped in the usual  manner with an iodine Vi-Drape used.  The MRI scans were received from [facility] and these were transmitted digitally and studies were used to  evaluate the pedicles appropriately intraoperatively.  An incision was made  extending from  about L3 to S2 in the midline through the deep and  subcutaneous layers, a deep incision, quite a bit of adipose present down to  the lumbodorsal fascia.  This was then incised on both sides of the spinous  process of S1, L5, L4 and L3.  Two Cobbs were used to elevate the paralumbar  muscle bilaterally off the posterior aspect of the spinous process and  lamina of L5, S1, S2, L4 and partly at L3.  The incision was carried out  with exposure out over the facets at the L5-S1 level performing  capsulotomies at both sides.  Using electrocautery this was carried out to  the sacral ala on both sides.  It was then carried over the lateral aspects  of the facet at the L4-5 level preserving the facet capsule of this segment  bilaterally exposing the transverse process of L5 bilaterally.  Carefully  the soft tissue was released bilaterally to allow for enough lateral exposure to allow for convergence of screws with screw placement for pedicle  screws at L5 and S1 bilaterally.  McCullough retractors were used initially.  Following the application fo the McCullough retractors then the Viper  retractors were placed. Bone graft was harvested from the right iliac crest  through a subcutaneous approach to the right iliac crest. A separate fascial  incision was made over the crest and bone graft then harvested from the  right posterior superior iliac spine using curved and straight osteotomes  and gouges.  When enough bone graft had been harvested that was felt to be  enough, Gelfoam was placed over the iliac crest bone graft site and the  central incision reopened.  Viper retractors were replaced.  A Leksell  rongeur was used to divide the interspinous ligaments between L5 and L4  above and L5 and S1 below.  A large curette was used to help loosen the  attachments to the ligamentum flavum to  the undersurface of the L5 lamina  inferiorly.  With this then the entire neural arch of L5 was able to be  lifted  off free as it was quite loose secondary to the bilateral isthmic  defects.  At this point attention was turned to placement of the pedicle  screws as bone graft was available for bone grafting of the posterolateral  regions with entry of screws.  First on the left side the pedicle screws  were placed at S1 using a Leksell rongeur to remove a portion of the  posterior aspect of the superior articular process of S1 and an awl was then  used to perform an opening into the cortex just lateral to the superior  articular process of S1 over its inferior extent.  A gear shift pedicle  finder was then used and the appropriate degree of convergence oriented  parallel to the end plate of S1 observed on C-arm fluoroscopy to be in the  correct position and alignment.  Note the C-arm fluoroscopy was also used at  the beginning of the case for localization fo the L5 level with a marker in  the L5-S1 facet.  The pedicle screw at the S1 level on the left side was  placed first following pedicle finding verifying on radiographs and then  palpating the pedicle hole with a ball tipped probe, tapping with a 6.25 tap  and then placing a 40 mm, 7.0 screw on the left side at S1.  This was done  without difficulty.  Radiographs and fluoroscopy with the C-arm demonstrated  the screw to be in good position and alignment.  Attention was then turned  to the left L5 level and the junction of the transverse process, pedicle and  superior articular process of L5 all were able to be easily evaluated and  were quite well visualized. An awl was used to perform entry at the  intersection at the base of the transverse process with the superior  articular process of L5.  Then a Falor held pedicle finder was then used to  carefully probe the pedicle chamber.  This was observed on C-arm fluoroscopy  to be in good position and alignment.  A ball tipped probe was used to probe the channel on this left side demonstrating patency of  the pedicle walls.  The depth measuring 45 mm tap then performed.  Note, the decortication was  carried out over the ala and the transverse process of L5 and bone graft  applied to these areas taken from the right iliac crest.  Following this  then a 45 mm, 7.0 screw was then placed into the L5 pedicle on the left  obtaining excellent purchase.  The screw heads were then broken using the  breaker provided.  A small 55 mm rod in place extending from L5 to S1 and  initial caps then placed at this level. Attention was then turned to the  right side where similarly pedicle screws were placed in the S1 level on the  right and then on the right side at L5.  Continuing on the right side then,  osteotomes were then used to perform osteotomy of the medial aspect of the  S1 superior articular process and ligamentum flavum was resected at the L5-  S1 level off the superior aspect of the S1 lamina performing a foraminotomy  over the bilateral S1 nerve roots and performing lateral recess  decompression.  The lateral recess decompression was carried up and a  central portion  of ithe lamina was resected at the L5 level and bilateral  lateral recess was decompressed.  This allowed identification of the L5  nerve roots where continued decompression was carried into the lateral  recess and out the neural foramen on both sides decompressing the L5 nerve  roots until they were quite free.  Severe compression was noted on the right  side, not as severe on the left.  A hockey stick neural probe could easily  be passed out the neural foramen at both the L5 and S1 nerve roots following  this decompression.  The thecal sac and right L5 nerve root were then  carefully retracted and epidural veins cauterized.  An incision was made in  the disk with a #15 blade scalpel and a pituitary used to excise the disk  material.  Note that following placement of the screws on the right side at  L5 and S1 similar to the left  side, intraoperative C-arm fluoroscopy was  used to ascertain the correct position and alignment of the screws.  Note  that all screws were tested using the pedicle and soft tissue resistance  screw measurement.  The left L5 measuring greater than 40 and the left S1  measuring approximately 25.  The right S1 measured 16 and the right L5  measured 35.   It was indicated that there was adequate placement of the screws and under C-  arm fluoroscopy position and alignment of the screws appeared to be well  within the pedicles.  With this then, the disk space at the L5-S1 level was  prepared for a TLIF.  Decortication was carried down to the transverse  process of L5 and the sacral ala on the right side.  Bone graft was placed  out over these areas prior to insertion of the pedicle screws as on the left  side.  With this completed then the disk space was prepared using pituitary rongeurs and curettes and then ring curettes, curetting the cartilaginous  end plates as well as disk material from the intervertebral disk space.  The  disk space was dilated from an 8 to a 9 and then to 11 mm.  A 10 mm cage was  chosen and a 10 mm trial performed which showed excellent elevation fo the  L5-S1 disk space.  Because of the amount of lordosis here, a lordotic cage  was chosen.  A 10 mm cage was impacted with the cancellous bone graft.  After preparing the disk space the laminas were carefully spread using a  laminar spreader to allow for easier insertion of the TLIF from the right  side.  The thecal sac and the L5 nerve root were retracted.  The 10 mm cage  was then impacted into place and observed on C-arm fluoroscopy to be in good  position and alignment.  It was further kicked across the midline and  rotated centrally.  The graft was then in good position and alignment.  Irrigation was performed.  Bleeders were controlled using bipolar  electrocautery.  Gelfoam was placed.  Attention was turned to the  patient's  pedicle screw, rod and appliances.  Care was taken to ensure the appropriate  amount of rod extended superior to the L5 fastener.  The fastener on the  left was then carefully tightened to 80 foot pounds using the tightener at  the L5 level along with the counter torque device.  Compression was obtained  on the left side first and then the lower fastener  attached to the rod and  similarly torqued to 100 foot pounds using a torque device as well.  The  right side was similarly done torquing first to the L5 fastener of the rod  and then the S1 level.  Note that compression was obtained on the right side  as well.  Following this then irrigation was performed.  A Hemovac drain was  placed in the depth of the incision.  The soft tissues were carefully  debrided of any devitalized tissue.  Additional morselized Symphony bone  graft was then placed over the posterolateral region extending from L5 to  S1.  This was local bone graft as well as right iliac crest bone graft.  The  entire wound was then sprayed with the platelet rich plasma solution.  The  right iliac crest bone graft harvest site was irrigated and closed with  interrupted figure-of-eight sutures of #1 Vicryl. The subcuticular layer  closed over the dead space using a #1 Vicryl stitch.  The midline incision  was then closed with interrupted figure-of-eight simple sutures of #1  Vicryl.  The deep subcutaneous layer was approximated with interrupted #1  Vicryl sutures.  Three #1 Prolene stay sutures were placed and the  subcutaneous layer was approximated with interrupted 2-0 Vicryl sutures and  the skin closed with a running subcutaneous stitch.  Steri-Strips were  applied.  Adaptic and 4 x 4's were fixed to the skin with Hypofix tape along  with an ABD.  The patient was then returned to a supine position.  A volar  plaster splint was applied to the right wrist, a cock-up in dorsiflexion of about 20 to 30 degrees.  The  patient was then reactivated, extubated and  returned to the recovery room in satisfactory condition.  All instrument and  sponge counts were correct.                                               Kerrin Champagne, M.D.    Myra Rude  D:  05/12/2003  T:  05/13/2003  Job:  [id number]

## 2010-07-20 NOTE — Op Note (Signed)
NAME:  Natalie Dodson, Natalie Dodson                        ACCOUNT NO.:  [id number]   MEDICAL RECORD NO.:  [id number]                   PATIENT TYPE:  INP   LOCATION:  3027                                 FACILITY:  [facility]   PHYSICIAN:  Kerrin Champagne, M.D.                DATE OF BIRTH:  [birth year]   DATE OF PROCEDURE:  08/31/2003  DATE OF DISCHARGE:                                 OPERATIVE REPORT   PREOPERATIVE DIAGNOSIS:  Epidural abscess, L5-S1, status post irrigation and  debridement on August 29, 2003.   POSTOPERATIVE DIAGNOSIS:  Epidural abscess, L5-S1, status post irrigation  and debridement on August 29, 2003.   PROCEDURE:  Repeat irrigation and debridement of L5-S1 epidural abscess.   SURGEON:  Kerrin Champagne, M.D.   ASSISTANT:  Wende Neighbors, P.A.   ANESTHESIA:  GOT, __________ [facility].   ESTIMATED BLOOD LOSS:  100 cc.   DRAINS:  None.   BRIEF CLINICAL HISTORY:  The patient is a 60 year old female, three months  out from a decompression and fusion of the L5-S1 level for grade 2  spondylolisthesis.  She had stay sutures placed because of her size and  abundance of adipose and the stays eroded the skin within the first week and  half requiring removal.  The patient then developed wound dehiscence, was  placed on dressing changes.  After a period of five weeks, decision made to  go ahead and excise the incision as it was clean and granulating, and she  was then taken to the OR for excision of the wound and closure of her  drains.  This did well.  She apparently returned to work last week, began  having problems with severe fevers, increasing back pain with radiation to  her legs.  Her return to the office demonstrated temperature of 103 two days  ago, and she was admitted.  An MRI scan demonstrating an epidural abscess at  the L5-S1 level.  She was taken to the OR, underwent irrigation and  debridement, found to have an area of purulence over the right  posterolateral fusion site and also over the left L5-S1 level.  She was  returned to the OR today after two days of open wound to evaluate the  incision and consider closure over drains.   INTRAOPERATIVE FINDINGS:  The patient was found to have a pocket of abscess  still present over the right side of the L5-S1 level, approximately 20-30 cc  of purulent drainage obtained.   DESCRIPTION OF PROCEDURE:  After adequate general anesthesia, the patient in  a prone position, chest rolls, all pressure points well padded, knee-high  TED hose.  Standard prep with Betadine scrub and prep solution.  Iodine  ViDrape was used.  The patient's wound opened widely.  Irrigation performed  using pulsatile irrigation system and normal saline.  This completed, a  McCullough retractor was inserted allowing for exposure of the  L5-S1 level  and laminotomy defect.  Microcurette then used to enter along the medial  aspect of the S1 facet on the right side, and an area of purulent material  entered and drained of some 20-30 cc of purulent material.  This area then  widely opened using a D'Errico as well as hockey stick neuroprobe.  Small  amount of devitalized fat tissue excised from this area, irrigation  performed using copious pulsatile irrigant solution. Because of the presence  of purulence at the time of the repeat incision and drainage, it was felt  that closure was not possible.  The patient had packing of the wound open  using Kerlix soaked in saline, 4 x 4's and ABD pad fixed to the skin with a  sterile ViDrape.  The patient was then reactivated following return to the  supine position, extubated, and returned to the recovery room in  satisfactory condition.  All instrument and sponge counts were correct.                                               Kerrin Champagne, M.D.    Myra Rude  D:  08/31/2003  T:  08/31/2003  Job:  [id number]

## 2010-07-20 NOTE — Op Note (Signed)
NAME:  Natalie Dodson, Natalie Dodson                        ACCOUNT NO.:  [id number]   MEDICAL RECORD NO.:  [id number]                   PATIENT TYPE:  OIB   LOCATION:  5027                                 FACILITY:  [facility]   PHYSICIAN:  Kerrin Champagne, M.D.                DATE OF BIRTH:  October 13, 1950   DATE OF PROCEDURE:  07/25/2003  DATE OF DISCHARGE:                                 OPERATIVE REPORT   PREOPERATIVE DIAGNOSIS:  Lumbar incision dehiscence now almost 2 1/2 months  following lumbar fusion and decompression.  The patient, postoperatively  without necrosis and went onto open wound almost two weeks following initial  surgery.   POSTOPERATIVE DIAGNOSIS:  Lumbar incision dehiscence now almost 2 1/2 months  following lumbar fusion and decompression.  The patient, postoperatively  without necrosis and went onto open wound almost two weeks following initial  surgery.   PROCEDURE:  Excision of lumbar open wound and primary closure over a drain.   SURGEON:  Kerrin Champagne, M.D.   ASSISTANT:  Wende Neighbors, P.A.   ANESTHESIA:  GOT, Dr. Krista Blue.   ESTIMATED BLOOD LOSS:  150 mL.   DRAINS:  10 mm flat Jackson-Pratt drain x 1.   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  Postoperatively, she was  seen back in the office.  She had removal of retaining sutures at  approximately two weeks postop as the patient was having quite a bit of  erythema and skin irritation.  The patient, at postop day 12, was seen to  have eschar over the entire length of the incision.  The patient then  developed drainage from her incision approximately three weeks postop.  The  area of wound was opened, some sutures removed, and the patient begun on  daily dressing changes.  The patient,  despite serial dressings, has kept the  incision rather large and open for the last 2 1/2 months.  She has done well  following her lumbar decompression and fusion and returned to most of her  activities.  Indeed, she is ready to return to work but the incisions open  status prevents her from returning to work.  She has undergone preoperative  studies including sedimentation rate which is normal and white cell count  that is normal.  Her temperature has remained afebrile.  She underwent a  preoperative sonogram which demonstrated the open area of the wound over her  lumbar area in the midline tracking from about L3 downwards to the subcu  area over the L5-S1 level but does not appear to go deep to the fascia.  The  patient, because of persistent difficulties with dressing changes and  difficulty with healing, she  is brought back to the operating room to  undergo a primary wound excision with primary closure over a drain.   SPECIMENS:  Cultures were obtained of the sinus tract prior to its fixation.   DESCRIPTION OF PROCEDURE:  After adequate general anesthesia, the patient in  a prone position and chest rolls placed, all pressures points were well  padded.  Preoperative antibiotics were withheld until cultures were  obtained.  Standard prep with Betadine scrub and prep solution over the  lumbar area extending from the lower lumbodorsal junction to the S3 level.  She had initial towel drapes placed.  Cultures were then obtained using  anaerobic and aerobic culture median.  She then underwent further draping  and Ioban Vi-Drape was placed.  An incision ellipsing the old incision and  the old incision opening at the L3-L4 level was made.  The incision was  approximately from L1 to S2 in the midline, through the skin and  subcutaneous layers, care taken to try and prevent injury into the sinus  tract, excising all remnants of the sinus down to the lumbodorsal fascia and  excising beneath the  open  wound.  This was done bilaterally.  Bleeders were  controlled using electrocautery.  Some amount of sinus was noted to extend  towards the right side and right iliac crest area of previous iliac crest  bone graft harvest and this was further excised.  The lumbodorsal fascia was  carefully freed up bilaterally at the L4 level and raised along with the  adipost tissue superficial to this area bilaterally.  This layer then was  closed with interrupted #1 Vicryl sutures.  A flat JP drain was placed deep  to the fascial layer exiting over a stab wound over the right iliac crest.  This was sewn in place with a 4-0 Prolene stitch.  The deep subcu layers  were then carefully approximated with interrupted #1 Vicryl sutures.  Four  #2 Prolene stay sutures were placed into the most superficial layers  extending deep about 3-4 cm.  These stay sutures pulled the subcu areas  together quite nicely.  Additional more superficial subcu sutures were  placed using 2-0 Vicryl.  The skin was closed with stainless steel staples.  Dressing of Adaptic, 4 by 4s, ABD pad was then affixed to the skin with  paper tape.  The drain was charged using the Jackson-Pratt drain.  The  patient was then reactivated, extubated, and returned to the recovery room  in satisfactory condition.  All instrument and sponge counts were correct.                                               Kerrin Champagne, M.D.    Myra Rude  D:  07/25/2003  T:  07/25/2003  Job:  [id number]

## 2010-07-20 NOTE — Discharge Summary (Signed)
Natalie Dodson, SEBRING              ACCOUNT NO.:  [id number]   MEDICAL RECORD NO.:  [id number]           PATIENT TYPE:   LOCATION:                                 FACILITY:   PHYSICIAN:  Kerrin Champagne, M.D.   DATE OF BIRTH:  January 15, 1951   DATE OF ADMISSION:  08/29/2003  DATE OF DISCHARGE:  09/06/2003                                 DISCHARGE SUMMARY   ADMISSION DIAGNOSIS:  1.  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  2.  Three months status post lumbar decompression and fusion at L5-S1 with      translaminar interbody fusion for spondylolisthesis with bilateral      foraminal entrapment and spinal stenosis.  3.  Postoperative wound dehiscence.  4.  Status post cervical fusion C7-T1.   DISCHARGE DIAGNOSIS:  1.  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  2.  Three months status post lumbar decompression and fusion at L5-S1 with      translaminar interbody fusion for spondylolisthesis with bilateral      foraminal entrapment and spinal stenosis.  3.  Postoperative wound dehiscence.  4.  Status post cervical fusion C7-T1.  5.  Normocytic anemia.  6.  Hyperglycemia.  7.  Adrenal crisis treated with hydrocortisone.  Outpatient endocrine      consult pending.   PROCEDURES.:  1.  On August 29, 2003 patient underwent incision, irrigation, and debridement      of epidural abscess L5-S1 with the wound packed open.  This was      performed by Dr. Otelia Sergeant under general anesthesia.  2.  On August 31, 2003 patient underwent repeat irrigation and debridement of      L5-S1 epidural abscess by Dr. Otelia Sergeant, assisted by Maud Deed P.A.-C      under general anesthesia.  3.  On September 02, 2003 patient underwent repeat irrigation and debridement of      L5-S1 epidural abscess with delayed primary closure over 15-French      Hemovac drain performed by Dr. Otelia Sergeant, assisted by Maud Deed P.A.      under general  anesthesia.   CONSULTATIONS:  [facility] and infectious disease physicians   BRIEF HISTORY:  The patient is a 60 year old female three months status post  decompression and fusion of the L5-S1 level for grade 2 spondylolisthesis.  Patient developed progressive fever and chills over the past two weeks.  She  presented to Dr. Barbaraann Faster office with a temperature of 103 and was admitted  to the hospital for an MRI scan.  MRI scan demonstrated an epidural abscess  at the L5-S1 level.  Patient was admitted to the hospital and taken directly  to the operating room to undergo irrigation and debridement.  There she was  found to have an area of purulence over the right posterolateral fusion site  as well as over the left L5-S1 level.   BRIEF HOSPITAL COURSE:  During the first procedure her wound was packed  open.  She was sent to the orthopedic floor for continued care.  Medical  consult was obtained as patient was noted to have hyponatremia,  hyperglycemia, and anemia on admission.  The patient was seen by the  hospitalists for [facility].  They helped to manage the  patient's medical issues during the hospital stay.  She was felt to have a  possible adrenal insufficiency versus crisis and was treated with IV  steroids initially and then weaned on to oral steroids.  These were  discontinued prior to her discharge.  She did have episodes of hypotension  which was secondary to her anemia.  She eventually did require a blood  transfusion as her hemoglobin dropped to 7.3 with hematocrit 22.2.  Patient  tolerated transfusion with 2 units of packed red blood cells.  She did  continue to have a decreased cortisol level which was felt to be possibly  secondary to the adrenal insufficiency.  Patient was noted to have  hyperglycemia with a normal hemoglobin A1C.  She was felt to be  hyperglycemic secondary to her infectious process.  She was stable from all  of these issues at  time of discharge with recommendations and medications  made as well as patient being referred to an endocrinologist on an  outpatient basis.   Patient was seen by the infectious disease team at [facility].  Eventually her cultures did grow methicillin-resistant Staph aureus.  She  was initially treated with Ancef, vancomycin, and Rifampin.  The Ancef was  discontinued when the final cultures were obtained.  Vancomycin was  continued 1250 mg IV q.12h. until August 11.  Rifampin was also continued at  300 mg b.i.d.  Patient received a PICC line for continued IV antibiotic  therapy for a total of six weeks.  Recommendation for transition to oral  doxycycline for an additional two to three weeks was recommended.  Rifampin  was continued throughout the course of treatment.  Patient was placed on  Lovenox for DVT prophylaxis through her hospital stay.  Following each of  her surgical interventions dressing changes were done to the wound on a  daily basis.  Patient's neurovascular motor function of the lower  extremities remained intact throughout the hospital stay.  Patient was  treated with IV narcotics and weaned to p.o. narcotics as her comfort was  better controlled.  After her last irrigation and debridement drains were  monitored closely and as the drainage decreased these were eventually  pulled.  Once the drains were removed patient was able to be discharged to  her home.   PERTINENT LABS:  EKG on admission with normal sinus rhythm.  No significant  change since last tracing confirmed by Dr. Jens Som.  MRI of lumbar spine on  admission showing epidural abscess with phlegmonous changes extending from  the L4-S1 levels.  Chest x-ray on admission showed suboptimal inspiration,  cardiomegaly, but no definite acute process.  CBC on admission with WBC 10.1  and hemoglobin and hematocrit 9.6 and 28.6, respectively.  Platelet count was elevated on admission at 418, but remained  normal throughout the  hospital stay.  Hemoglobin and hematocrit dropped to their lowest value of  7.3 and 22.2.  Patient received 2 units of packed red blood cells and prior  to discharge hemoglobin and 10.0, hematocrit 30.0.  Sedimentation rate on  admission was 80.  Patient had negative occult blood checked for her stools.  Chemistry studies on admission with sodium 131, glucose 129, albumin 128,  remaining values normal.  BMET was monitored throughout the hospital stay.  Values all remained within normal limits with exception of glucose which  fluctuated from 242-112.  Calcium was noted to be low with a value of 7.9 on  July 2.  Hemoglobin A1C on June 29 was 5.8.  Anemia studies with iron 18,  TIBC 180, percent saturation 10.  Cortisol levels initially with a.m.  reading of 1.9.  This was on August 30, 2003.  Repeat on September 01, 2003 showed  values once again within normal limits.  Urinalysis on admission was  negative for urinary tract infection.  Final cultures from her wound and  epidural abscess as stated above.   PLAN:  Patient was discharged to her home.  Arrangements made through  [facility] to receive IV vancomycin.  Patient was also  continued on Rifampin.  She was given a prescription for Mepergan fortis to  utilize for her discomfort.  Patient continued on hydrocortisone 20 mg  daily.  She was instructed to do this until she was seen by Dr. Everardo All on  an outpatient basis.  She was also given Protonix 40 mg daily, Zoloft 50 mg  daily, ferrous gluconate 300 mg twice daily.  Patient was advised to  continue on a low carbohydrate diet.  She was instructed to be at bedrest at  home with out of bed to ambulate within the home only.  Lovenox was  continued at discharge.  Patient was advised to follow up with Dr. Everardo All  on July 14.  She will be seen by Dr. Otelia Sergeant two weeks from the date of her  surgery.  Dressing changes will be done daily at home.  She will monitor  her  temperatures at home and should she continue to have elevations she will  give [place] a call.   CONDITION ON DISCHARGE:  Stable.      SMV/MEDQ  D:  05/17/2004  T:  05/17/2004  Job:  [id number]

## 2010-07-20 NOTE — Op Note (Signed)
NAME:  Natalie Dodson, Natalie Dodson                        ACCOUNT NO.:  [id number]   MEDICAL RECORD NO.:  [id number]                   PATIENT TYPE:  INP   LOCATION:  5004                                 FACILITY:  [facility]   PHYSICIAN:  Kerrin Champagne, M.D.                DATE OF BIRTH:  August 28, 1950   DATE OF PROCEDURE:  DATE OF DISCHARGE:                                 OPERATIVE REPORT   ADDENDUM   The procedure was a right open carpal tunnel release followed by central  laminectomy at the L4-5 and the L5-S1 levels with bilateral  L5 and S1 nerve  root decompression.  A TLIF via the right foraminal approach with insertion  of a 10 mm Lepird Depuy lordotic cage with right iliac crest bone graft  harvest through a separate fascial incision.  Then posterolateral fusion, L5-  S1 with Monarch pedicle screw and rod system.  TLIF was performed with the  L5-S1 level as well.  L4-5 was not fused.  The patient tolerated the  procedure well.                                               Kerrin Champagne, M.D.    Myra Rude  D:  05/14/2003  T:  05/16/2003  Job:  [id number]

## 2010-07-20 NOTE — Procedures (Signed)
   NAME:  Natalie Dodson, Natalie Dodson                        ACCOUNT NO.:  [id number]   MEDICAL RECORD NO.:  [id number]                   PATIENT TYPE:  UT   LOCATION:  RAD                                  FACILITY:  [facility]   PHYSICIAN:  Donna Bernard, M.D.             DATE OF BIRTH:  1950-04-09   DATE OF PROCEDURE:  DATE OF DISCHARGE:  08/21/2001                                    STRESS TEST   TYPE OF TEST:  Cardiolite augmentation.   INDICATIONS FOR PROCEDURE:  The patient has been experiencing atypical chest  discomfort.   Stress test was performed at standard Bruce protocol.  Resting EKG showed  normal sinus rhythm with no significant ST-T changes.  The patient tolerated  the first stage quite well.  During the second stage the patient developed  advancing heart rate as expected . Cardiolite was injected appropriately 1  minute prior to cessation of exertion.  She reached a maximum heart rate of  155.  At this point at 0.08 seconds past the J-point, there were no  significant ST segment.  There were no significant ST segment changes.   IMPRESSION:  Negative adequate stress test.   PLAN:  Await Cardiolite documentation.                                               Donna Bernard, M.D.    Karie Chimera  D:  09/30/2001  T:  10/06/2001  Job:  [id number]

## 2010-07-20 NOTE — Op Note (Signed)
NAMEDENETRA, FORMOSO              ACCOUNT NO.:  [id number]   MEDICAL RECORD NO.:  [id number]          PATIENT TYPE:  INP   LOCATION:  0471                         [facility]ACILITY:  [facility]   PHYSICIAN:  Kerrin Champagne, M.D.   DATE OF BIRTH:  1951/03/02   DATE OF PROCEDURE:  07/12/2004  DATE OF DISCHARGE:                                 OPERATIVE REPORT   PREOPERATIVE DIAGNOSIS:  1.  Pyarthrosis left shoulder treated by Dr. Francena Hanly this evening with      an arthroscopic irrigation debridement.  2.  Abscess left L5-S1 level. Possible osteomyelitis right L4-5 facette.   POSTOPERATIVE DIAGNOSIS:  1.  Pyarthrosis left shoulder.  2.  Abscess left L5-S1 hardware. No sign of osteomyelitis involving the      right L4-5 facette.   DESCRIPTION OF PROCEDURE:  This procedure followed Dr. Caryn Bee Supple's  procedure on this patient's left shoulder and arthroscopic irrigation and  debridement of a left shoulder pyarthrosis. The procedure performed by  myself was an incision irrigation and excisional debridement of a L5-S1  abscess with removal of loosened hardware pedicle screws and rods L5-S1.   SURGEON:  Dr. Vira Browns.   ASSISTANT:  Maud Deed, P.A.-C.   ANESTHESIA:  GOT, Dr. Okey Dupre.   ESTIMATED BLOOD LOSS:  250 mL.   DRAINS:  Medium Hemovac drains x1 a and Foley to straight drain. The patient  had previous drain placed in the left shoulder by Dr. Vernelle Emerald earlier in  this case.   HISTORY OF PRESENT ILLNESS:  A 60 year old female who one year ago underwent  a 360 degree fusion for spondylolisthesis at the L5-S1 level. This was  complicated by wound dehiscence and fat necrosis treated with dressing  change in the office, eventually underwent excision and closure. She went on  to develop an epidural abscess and was drained. This grew out a MRSA type  bacteria. She underwent an incision, irrigation and debridement with closure  over drains of this abscess treated with IV antibiotics and  p.o. medications  until the infection was eradicated. She in the interval has undergone a left  shoulder procedure involving a debridement of the distal clavicle as well as  rotator cuff repair, arthroscopically in February of this year. Over the  past six weeks, she has been developing increasing low back pain, sed rate  has demonstrated elevation of sed rate to 40, follow-up sed rate indicating  further elevation to 90. MRI study demonstrating facette changes in the  right L4-5 level could be consistent with an osteomyelitis changes or  facette pyarthrosis. The patient underwent attempts at aspiration that were  unsuccessful and cultures grew negative. Over the past week and a half has  developed increasing left shoulder pain and discomfort yesterday and was  aspirated in the office for purulent material. She was brought into the  hospital, underwent IV antibiotic treatment and today is brought to the  operating room following MRI of the left shoulder demonstrating pyarthrosis  and MRI of the lumbar spine demonstrating an area of fluid collection over  the right side at L5-S1.   INTRAOPERATIVE  FINDINGS:  The findings regarding this patient's  shoulder  are reviewed by Dr. Francena Hanly in his operative note today. Findings  regarding her lumbar spine indicate she had an area of purulence noted over  the left side at the S1 screw level, the screw noted to be loosened. The  screw also on the right side of the S1 was loosened as well as right L5. The  screw at L5 on the left was solid. She underwent removal of hardware.  Evaluation of the fusion appeared to show that there was no motion present  with stressing the fusion site.   DESCRIPTION OF PROCEDURE:  This patient had been placed into a  prone  position with Wilson frame, all pressure points well-padded, previous Foley  catheter. She had undergone the left shoulder procedure, arthroscopic  irrigation and  debridement with placement of  a drain by Dr. supple.  In  this position,  she underwent a standard prep with DuraPrep solution from  the lower dorsal spine to the mid sacral level, she was draped in the usual  manner, iodine vidrape was used. The incision was made ellipsing the old  incision scar in the midline. Through the skin and subcutaneous layers  directly down to the lumbodorsal fascia, this was incise at the midline from  the S2 level to the L3 level. A Cobb was used to carefully elevate the  paralumbar muscles at the L4 level bilaterally down to the level of the  lamina on both sides. Carefully soft tissue removed down to this level and  carried out laterally to the patient's hardware while exposing the hardware  on the left side at the S1 and L5 level. An area of purulent drainage was  encountered and this was sent for culture and sensitivity. Also in the subcu  areas over the left mid sacral area, there was some brownish fluid and this  was sent for culture and sensitivity. After this irrigation was performed,  careful inspection then carried out over the hardware bilaterally. The  hardware was then removed by removing the caps from the fasteners at each  segment to L5 bilaterally and S1 bilaterally and then the rods were removed  without difficulty. Each of the pedicle screws were then carefully loosened  and removed, the two screws at the S1 level were able to be removed purely  grasping with a Kocher clamp and pulling them forward. L5 on the left was  solid and required its removal using the  insertion screwdriver backing it  out appropriately without difficulty on the right side at L5. This required  the use of the tonsil clamp or a clamp underneath the screw to apply  pressure and allow for it to be removed. Each of the screw holes were then  carefully curettaged. Then irrigated with copious amounts of irrigant  solution including saline and then double antibiotic solution. Other than the pocket of area  of purulence over the left S1 level, no other site of  purulence was noted and no other further tracking drainage was noted. On the  preoperative MRI, there was some question of fluid collection along the  right side just medial to the patient's hardware, this was not found to be  present intraoperatively. Further irrigation was then carried out with  copious amounts of irrigant solution and then two medium Hemovac drains were  placed in the depth of the incision after careful excision of debris that  was felt to be present that did  not have good vascular supply. Each of the  drains were then placed over the lateral aspects of the posterior lateral  fusion site, exiting over the right iliac crest of these were sewn in place  with 4-0 nylon suture. Following this then the lumbodorsal fascia was  approximated in the midline with interrupted #1 Vicryl sutures. The subcu  layers of skin are approximated with #2 Prolene sutures with skin bridges.  Stainless steel staples were then used to close the skin. No subcu sutures  were used. A dressing of Xeroform gauze, 4x4s, ABD pads fixed to the skin  with Hypafix tape. The patient then returned to a supine position,  reactivated, extubated, and returned to the recovery room in satisfactory  condition. Cultures obtained intraoperatively, left sacral level and left S1  screw.      JEN/MEDQ  D:  07/12/2004  T:  07/12/2004  Job:  [id number]

## 2010-07-20 NOTE — Op Note (Signed)
NAME:  CAYDANCE, KUEHNLE                        ACCOUNT NO.:  [id number]   MEDICAL RECORD NO.:  [id number]                   PATIENT TYPE:  INP   LOCATION:  3027                                 FACILITY:  [facility]   PHYSICIAN:  Kerrin Champagne, M.D.                DATE OF BIRTH:  [birth year]   DATE OF PROCEDURE:  DATE OF DISCHARGE:                                 OPERATIVE REPORT   DATE OF OPERATION:  August 29, 2003.   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   POSTOPERATIVE DIAGNOSES:  Epidural abscess, L5-S1, areas of abscess over the  right posterolateral fusion area as well as over the left S1 sacral area and  lamina posteriorly.   PROCEDURE:  Incision, irrigation, and debridement of epidural abscess, L5-  S1, wound packed open.   SURGEON:  Kerrin Champagne, MD.   ANESTHESIACamie Patience, Bedelia Person, MD.   ESTIMATED BLOOD LOSS:  150 mL.   DRAINS:  None.   BRIEF CLINICAL HISTORY:  The patient is a 60 year old female, three months  out from a lumbar decompression and fusion at the L5-S1 level with T-lift  for a spondylolisthesis with bilateral foraminal entrapment and spinal  stenosis.  The surgery proceeded uneventfully.  She, however, had stay  sutures placed for an extremely large amount of subcutaneous adipose, and  postoperatively she developed erosion of her stay sutures into her skin and  localized signs of infection requiring excision of these stay sutures early  at about 2 weeks postop.  She then went on to dehisce the back incision  site.  Underwent a dressing change program daily for about 5 weeks postop.  As the wound appeared to be quite good with some granulation present and no  significant drainage, the patient was returned to the operating room and  underwent excision of a lumbar sinus and a closure over drains.  She did  well up until about a week ago.  She returned to work and began experiencing  pain in  her pain with radiation to her thighs.  She developed night chills  and fevers.  Ultimately, she presented to the office today with a  temperature of 103.  She was admitted acutely and underwent an MRI study,  which demonstrated an epidural abscess at the L5-S1 level.  She is taken to  the operating room today to undergo incision, irrigation, and debridement of  the epidural abscess.   INTRAOPERATIVE FINDINGS:  The patient was found to have an epidural abscess  extending to the right side with involvement in the right posterolateral  fusion mass as well as over the left sacral lamina at S1.   DESCRIPTION OF PROCEDURE:  After adequate general anesthesia with the  patient in a prone position, all pressure points well-padded, chest rolls  were used, knee-high TED hose, standard prep with Duraprep solution over  the  posterior aspect of the dorsal lumbar spine to the upper sacral level.  Draped in the usual manner.  Iodine Vi-Drape was used.  The incision was  made through the old previous incision scar and carried sharply down to the  lumbodorsal fascia, and this was incised in the midline.  Retained suture  was resected using hemostats.  The incision carried along the spinous  process of L4 and L3 superiorly, cobs used to elevate the paralumbar muscles  both sides of the L4 level down to the level of the facet at the L4-5 level.  The laminotomy region centrally carefully exposed.  Cobs used to elevate the  paralumbar muscles off the central laminotomy defect.  This carried down to  the level of the S1 sacrum lamina and again cobs used to elevate soft  tissues, both left and right side.  On elevating on the left side, the area  of purulence was noted to be present.  This appeared to be associated with  the areas of graft and Symphony graft material.  This was irrigated with  copious amounts of irrigant solution and then suctioned dry.  Over the right  posterolateral fusion mass, again areas  of purulence noted and this was  irrigated and debrided.  Soft tissue was debrided from the fusion mass along  the right side posterolaterally using Leksell rongeurs, and the scar tissue  over the laminotomy defect posteriorly was carefully excised, freeing it up  with the curette and then excising it with Leksell rongeurs as well as  curettes.  The interval between the thecal sac and the right S1 pedicle was  identified and a curette used to carefully perform a laminectomy along the  right side entering the spinal canal along the medial aspect of the S1  pedicle.  With this, there was only a small amount of purulence encountered.   Irrigation was performed using pulsatile irrigant solution of saline.  The  whole incision was irrigated with copious amounts of irrigant to about the  tune of 3 liters.  Following this then, additional double antibiotic  solution was applied to the incision.  Once this was completed then further  irrigation was carried out.  With further inspection, no other areas of  purulence were noted.  Please note cultures, anaerobic and aerobic, were  obtained with entry into the areas of purulence along the right side of the  posterolateral as well as the left S1 sacral area.  Both anaerobic and  aerobic cultures were obtained and sent.  Following further irrigation and  removal of irrigant, then the wound was packed open using Kerlix saline-  soaked, 4x4s, ABD pads affixed to the skin with a paper tape.  The patient  returned to a supine position, reactivated, extubated, and returned to the  recovery room in satisfactory condition.  All instrument and sponge counts  were correct.                                               Kerrin Champagne, M.D.    Myra Rude  D:  08/31/2003  T:  08/31/2003  Job:  [id number]

## 2010-07-20 NOTE — Op Note (Signed)
NAMENAKEITHA, MILLIGAN              ACCOUNT NO.:  [id number]   MEDICAL RECORD NO.:  [id number]          PATIENT TYPE:  INP   LOCATION:  0471                         FACILITY:  [facility]   PHYSICIAN:  Vania Rea. Supple, M.D.  DATE OF BIRTH:  12/08/1950   DATE OF PROCEDURE:  07/12/2004  DATE OF DISCHARGE:                                 OPERATIVE REPORT   PREOPERATIVE DIAGNOSES:  1.  Left shoulder septic arthritis.  2.  Left shoulder recurrent rotator cuff tear.   PROCEDURE:  1.  Left shoulder glenohumeral joint diagnostic arthroscopy.  2.  Arthroscopic lavage and synovectomy, left shoulder glenohumeral joint.  3.  Arthroscopic subacromial bursectomy.  4.  Debridement of rotator cuff tear.   SURGEON:  Vania Rea. Supple, M.D.   Threasa HeadsFrench Ana A. Shuford, P.A.-C.   ANESTHESIA:  General endotracheal.   ESTIMATED BLOOD LOSS:  Minimal.   DRAINS:  Hemovac x1, which is sutured in.   HISTORY:  Ms. [name] is a 60 year old female who is status post a left  shoulder arthroscopic rotator cuff repair back in February of this year,  which I had performed.  She had initially done well during her postoperative  recovery period.  She reports that last Thursday, approximately six days  prior to admission, she awoke with severe left shoulder pain.  I had seen  her in our office this past Monday, and at that time, I had suspected that  she had developed an acute bursitis; however, her symptoms progressively  deteriorated, and on evaluation in the office yesterday by Dr. Otelia Sergeant, she  was noted to have significantly increased pain and some generalized swelling  about the left shoulder.  Dr. Otelia Sergeant performed an aspiration, which yielded  grossly purulent material.  She was subsequently admitted and is now brought  to the operating room for planned left shoulder arthroscopic lavage.   Of note, Ms. 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  It is felt that the chronic lumbar infection has caused a  secondary seating of the left shoulder.  She is now planned for removal of  the retained lumbar hardware by Dr. Otelia Sergeant with debridement of the apparently  infected lumbar fusion.   Preoperatively discussed with Ms. Wilson treatment options as well as the  risks versus benefits thereof.  Possible surgical complications of bleeding,  persistent infection, loss of motion, progressive of rotator cuff tear, and  possible need for additional surgery was reviewed.  She understands,  accepts, and agrees with our planned procedure.   PROCEDURE IN DETAIL:  After undergoing routine preoperative evaluation, the  patient was brought to the operating room and placed supine on the operating  table and underwent induction of general endotracheal anesthesia.  She was  turned to the right lateral decubitus position on the bean bag and  appropriately padded and protected.  The left arm was then suspended in the  70/30 position with 10 pounds of traction.  The left shoulder upper region  was sterilely prepped and draped in a standard fashion.  A posterior portal  was established at the glenohumeral joint.  An anterior portal was  established under direct visualization.  There was abundant, proliferative  synovial tissue as well as proteinaceous exudate and some grossly purulent  material which was evacuated and extensive lavage was performed.  We  performed an extensive synovectomy.  The articular surfaces actually looked  to be in good condition with no significant fraying or fibrillation.  The  rotator cuff showed some attenuation of the fibers at the repair site.  Upon  debridement at the level of the repair site, there appeared to be some  grossly purulent material.  As this was evacuated, it became clear there was  a defect in the rotator cuff with an apparent recurrent rotator cuff tear.  It is unclear  whether there had been an intra-articular infection that then  progressed through the cuff defect or whether the infection had helped in  the bursa and moved through the cuff defect into the glenohumeral joint.  Regardless, we aggressively debrided the rotator cuff back to healthy-  appearing tissue.  Once the glenohumeral joint was copiously irrigated and  all tissue surfaces appeared clean and healthy.  Fluid and instruments were  then removed.  The arthroscope was introduced into the subacromial space  through the posterior portal and direct lateral portal established in the  subacromial space.  Abundant proliferative bursal tissue was identified as  well as apparently infected proteinaceous exudate, and this was all  extensively and completely removed, completing a subacromial bursectomy.  The rotator cuff margin was once again debrided from the bursal side.  The  sutures that had been used for the repair were removed.  The anchor was left  in place, since it is bioabsorbable.  We identified the distal clavicle, and  the acromioclavicular interval, and this area was completely opened up and  debrided, and there was no evidence for retained loculations in either the  acromioclavicular interval or anywhere else in the subacromial/subdeltoid  bursa.  At this point, final irrigation was then completed.  We introduced a  Hemovac drain through the posterior portal, and this was then sutured into  place.  The portals were closed with a 4-0 nylon simple suture.  Bulky dry  dressing was then taped over the left shoulder.  At this point, the patient  was then carefully positioned and rolled to a prone position on a bolster  and was then appropriately padded and protected in preparation for the  lumbar procedure, which was to be done under the direction of Dr. Erline Levine.      KMS/MEDQ  D:  07/12/2004  T:  07/12/2004  Job:  [id number]

## 2010-07-20 NOTE — Discharge Summary (Signed)
NAMEMarland Dodson  KAORI, JUMPER                        ACCOUNT NO.:  [id number]   MEDICAL RECORD NO.:  [id number]                   PATIENT TYPE:  INP   LOCATION:  5004                                 FACILITY:  [facility]   PHYSICIAN:  Kerrin Champagne, M.D.                DATE OF BIRTH:  1950-04-17   DATE OF ADMISSION:  05/12/2003  DATE OF DISCHARGE:  05/16/2003                                 DISCHARGE SUMMARY   ADMISSION DIAGNOSES:  1. Painful spondylolisthesis at L5-S1.  2. 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  3. Right carpal tunnel syndrome.  4. Status post anterior cervical diskectomy and effusion, C7-T1.  5. Status post right rotator cuff repair.  6. Status post total abdominal hysterectomy.   DISCHARGE DIAGNOSIS:  1. Painful spondylolisthesis at L5-S1.  2. 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  3. Right carpal tunnel syndrome.  4. Status post anterior cervical diskectomy and effusion, C7-T1.  5. Status post right rotator cuff repair.  6. Status post total abdominal hysterectomy.  7. Posthemorrhagic anemia requiring blood transfusion.   PROCEDURES:  Procedure on May 12, 2003, the patient underwent central  laminectomy, L4-5 and L5-S1, with 360 degree effusion utilizing translaminar  antibody effusion, L5-S1, with Monarch pedicle screws and rods, and right  iliac crest bone graft.  Also a right carpal tunnel release.   CONSULTATIONS:  None.   BRIEF HISTORY:  The patient is a 60 year old white female with previous  cervical effusion. She has had chronic and progressive low back pain.  Evaluation including an MRI demonstrated grade 1 spondylolisthesis at L5-S1,  and degenerative disk change above the segment involving the L2-3, L1-2, L3-  4, and L4-5 levels. Also significant lateral recessed stenosis at L4-5 was  found as well as L5 nerve root entrapment secondary to an isthmic  spondylolisthesis and entrapment within the foramen. She has had persistent  pain with  inability to relieve her leg pain with conservative measures. It  was felt that she would require surgical intervention for her spinal  problems. She was also noted to have carpal tunnel syndrome. The patient  underwent EMG and nerve conduction studies confirming right carpal tunnel  syndrome, and the patient was treated conservatively with splinting and anti-  inflammatory medication. She had persistent pain in the right Woodin. It was  felt that she would require surgical intervention for this diagnosis as  well. She was admitted to undergo the procedure as stated above.   BRIEF HOSPITAL COURSE:  The patient tolerated the procedure under general  anesthesia without complications. On the first postoperative day, the  patient was treated with IV analgesics. She was gradually weaned to p.o.  analgesics. Unfortunately, she developed symptoms of an ileus on the second  postoperative day. She required clear liquids throughout this timeframe  until her nausea and vomiting had subsided. Once the nausea and vomiting did  subside, she was able to take oral  analgesics and tolerated these well. On  the first postoperative day, Hemovac drain was discontinued from her wound  site of her back and a TLS drain was removed from the right Birman. The  patient remained in a splint to the right Herberg throughout the hospital stay.  She was not allowed weightbearing on the right wrist, and, therefore, used a  triceps support on the walker for ambulation. Dressing changes were done of  her back daily. The patient developed blistering at the medial aspect of the  incision. She did have some clear drainage from this area. It did not appear  to be cellulitic in nature and no purulence was noted. Dressing changes were  continued, and the patient was taught dressing change technique to be  utilized at home.   Physical therapy was initiated in the hospital. The patient utilized an  Aspen brace for ambulation. She was  encouraged in range of motion of the  right fingers and edema control as well. The patient was ambulating fairly  well prior to discharge from the hospital and was felt safe by the physical  therapist.   During the hospital stay, she did develop hyponatremia which was treated and  resolved at discharge. She was also noted to be anemic with a hemoglobin and  hematocrit of 8.9 and 29.9. She received two units of packed red blood cells  on the third postoperative day and was stable following the transfusion. On  her fourth postoperative day, the patient was afebrile with vital signs  stable. She was taking a regular diet. The patient was having flatus as well  as a bowel movement. The patient was voiding without difficulty and  ambulating in the hallway with her walker. She was felt stable for discharge  to her home with arrangements for home health physical therapy.   PERTINENT LABORATORY VALUES:  EKG on admission showed normal sinus rhythm.  CBC on admission was within normal limits. Hemoglobin dropped to 8.9 and the  patient received two units of packed red blood cells. Coagulation studies on  admission were normal. Routine chemistry studies showed hyponatremia  throughout the hospital stay, resolved on the day of discharge. Urinalysis  on admission was noted to have too numerous to count bacteria and positive  nitrite preoperatively. The patient received the usual intra- and  postoperative antibiotics. She did have a Foley catheter through the  hospital stay and once this was discontinued, she had no difficulties with  voiding.   PLAN:  The patient was discharged to her home. Arrangements made for home  health physical therapy evaluation. Durable medical equipment was made  available including a rolling walker with a triceps support. She was advised  to utilize this at all times when ambulating. She will also her brace at all times when ambulating. She was given restrictions for no  lifting greater  than five pounds, no twisting, bending, sitting or standing for long periods  of time and no driving. Dressing change will be done in the office on Friday  following her discharge. She is not allowed to shower until that time and  was given supplies to do a dry dressing change daily at home. The patient  was advised to elevate the right wrist as needed and to use ice packs as  needed. She will keep this dressing dry and clean at all times. The patient  was given prescriptions for [facility] for pain, Robaxin for spasm and  encouraged to use a daily stool softener.  She will use laxative of choice as  needed. The patient was advised to call the office if there are questions or  concerns prior to return office visit.      Wende Neighbors, P.A.                    Kerrin Champagne, M.D.    SMV/MEDQ  D:  06/30/2003  T:  07/01/2003  Job:  [id number]

## 2010-10-26 ENCOUNTER — Encounter (INDEPENDENT_AMBULATORY_CARE_PROVIDER_SITE_OTHER): Payer: Self-pay | Admitting: Surgery

## 2010-10-31 ENCOUNTER — Ambulatory Visit (INDEPENDENT_AMBULATORY_CARE_PROVIDER_SITE_OTHER): Payer: BC Managed Care – PPO | Admitting: Surgery

## 2010-10-31 ENCOUNTER — Encounter (INDEPENDENT_AMBULATORY_CARE_PROVIDER_SITE_OTHER): Payer: Self-pay | Admitting: Surgery

## 2010-10-31 NOTE — Progress Notes (Addendum)
Chief Complaint  Patient presents with  . Bariatric Follow Up    Discuss lap band replacement    Natalie Dodson is a 60 y.o. (DOB: Nov 19, 1950)  white female who is a patient of LUKING,W S, MD, MD and comes to me today for evaluation of replacing lap band.  She had her original Lap Band placed 12/12/2008.  She did well with weight loss.  She saw her back problems improve with the weight loss and her borderline diabetes improved.  Unfortunately, she developed a band erosion and I removed the Lap Band 01/02/2010.  The patient started with a weight of 220 pounds and BMI 39.1 before her first surgery. She successfully lost weight down to 164 pounds and a BMI of 28.8 in November of 2011  (the time of her lap band removal). Since that time she's gained 30 pounds back despite trying to diet.  She's had 6 back operations and has chronic back Dodson. Her weight loss significantly improved her back Dodson. She also had borderline diabetes mellitus improve with her weight loss.  She is also having some vague gastrointestinal issues - diarrhea and vague discomfort, sometimes in the RUQ.  She had a neg [place] of her gall bladder in 06/30/2008.  She is interested in having repeat bariatric surgery. I talked to her about the options and the 3 operations we do: Lap band, sleeve gastrectomy, and Roux-en-Y gastric bypass. I discussed with her the indications and risk of each operation. Because of her prior lap band surgery, any of the operation she chooses will carry some increased risk.  Particularly with leak or injury to the stomach in the area of the prior erosion.  She is interested in a possible repeat Lap Band as a first choice. She thinks a sleeve gastrectomy would be her second choice. I talked to her about the current state of sleeve gastrectomies in our practice.  Past Medical History  Diagnosis Date  . MRSA (methicillin resistant staph aureus) culture positive     Past Surgical History  Procedure Date  .  Laparoscopic gastric banding 12/12/08  . Appendectomy   . Hernia repair   . Tonsillectomy   . Rotator cuff repair   . Elbow surgery   . Abdominal hysterectomy   . Back surgery     X6  . Cervical fusion   . Removal of laparoscopic gastric band 01/02/10    No current outpatient prescriptions on file.    Allergies  Allergen Reactions  . Vancomycin     SOCIAL and FAMILY HISTORY:  PHYSICAL EXAM: BP 138/82  Pulse 60  Ht 5' 3.5" (1.613 m)  Wt 206 lb 4 oz (93.554 kg)  BMI 35.96 kg/m2  HEENT: Normal. Pupils equal. Normal dentition. Neck: Supple. No thyroid mass. Lymph Nodes:  No supraclavicular or cervical nodes. Lungs: Clear and symmetric. Heart:  RRR. No murmur. Abdomen: No mass. No tenderness. No hernia. Normal bowel sounds. Her incisions are all well healed. Rectal: Not done. Extremities:  Good strength in upper and lower extremities. Neurologic:  Grossly intact to motor and sensory function.  DATA REVIEWED: Scheduled UGI to evaluate gastric anatomy and [place] of GB because of her recent GI symptoms.  ASSESSMENT and PLAN: 1.  Morbid obesity (s/p band erosion)  Will write letter to insurance company.  Repeat UGI and [place] of gall bladder.   [UGI and Abd. [place] on 11/07/2010 - okay.  DN 11/08/2010]  Plan lap band repeat.  If lap band can not be done,  patient would like to consider sleeve gastrectomy as second alternative.  I talked to her about the state of sleeve gastrectomy in our office and the risk of any bariatric surgery.  [Patient called back and wants to consider sleeve resection.  I explained to her that I would consider her "complicated" and not a candidate for [place] to do early in our experience.  I would want Dr. Biagio Quint (who will start first with sleeves since he has experience) or myself to have several cases under our belt before trying to do her.  She's obviously frustrated by the delay, but I think it is best.  I also offered that she could go to a place already doing sleeves  and I would try to help her as best I could.  She wants to think about it and will call us back in a week or two.  I she chooses lap band, I would want to wait until November, 2012, anyway.  DN 10/12/10]  To see nutritionist prior to surgery (but hold off for now).  This will be important for the pre op diet and again reinforce post op diet.  2.  Some diarrhea and vague GI symptoms.  Patient wonders whether she has gall bladder disease.  Will check this preop. 3.  Chronic back issues.  Has had 6 prior back operations. 4.  History of borderline DM.

## 2010-10-31 NOTE — Patient Instructions (Signed)
Will get letter to insurance company.  Will plan to replace lap band.  Plan test to check gall bladder and stomach.  Will see nutritionist when surgery scheduled.

## 2010-11-01 ENCOUNTER — Other Ambulatory Visit (INDEPENDENT_AMBULATORY_CARE_PROVIDER_SITE_OTHER): Payer: Self-pay | Admitting: Surgery

## 2010-11-07 ENCOUNTER — Ambulatory Visit (INDEPENDENT_AMBULATORY_CARE_PROVIDER_SITE_OTHER): Payer: BC Managed Care – PPO | Admitting: Gastroenterology

## 2010-11-07 ENCOUNTER — Ambulatory Visit (HOSPITAL_COMMUNITY)
Admission: RE | Admit: 2010-11-07 | Discharge: 2010-11-07 | Disposition: A | Discharge status: Home / Self Care | Payer: BC Managed Care – PPO | Source: Ambulatory Visit | Attending: Surgery | Admitting: Surgery

## 2010-11-07 ENCOUNTER — Encounter: Payer: Self-pay | Admitting: Gastroenterology

## 2010-11-07 VITALS — BP 125/67 | HR 58 | Temp 97.2°F | Ht 63.0 in | Wt 208.8 lb

## 2010-11-07 DIAGNOSIS — R197 Diarrhea, unspecified: Secondary | ICD-10-CM

## 2010-11-07 DIAGNOSIS — Z6839 Body mass index (BMI) 39.0-39.9, adult: Secondary | ICD-10-CM | POA: Insufficient documentation

## 2010-11-07 NOTE — Patient Instructions (Signed)
Start taking a probiotic daily. Samples have been provided. You may obtain these over-the-counter.  Please complete stool studies and labs. We will be calling you with the results.  Review the low-fat and lactose-free diet. This should help with some of your symptoms.  We have set you up for a colonoscopy with Dr. Jena Gauss in the very near future.

## 2010-11-07 NOTE — Progress Notes (Signed)
Referring Provider: Ardyth Gal, MD Primary Care Physician:  Harlow Asa, MD, MD Primary Gastroenterologist:  Dr. Jena Gauss   Chief Complaint  Patient presents with  . Diarrhea    HPI:   [name] is a pleasant 60 year old female who presents today for a screening colonoscopy, as well as chronic diarrhea for the past few years. She reports weeks of diarrhea on end with multiple stools and urgency. This is unrelated to eating or drinking. She denies melena or brbpr. She also experiences some lower abdominal cramping at the time of loose stools. She states she "lives on imodium".  Denies any loss of appetite. Dairy, lettuce, chocolate worsens symptoms. Complains of bloating and gas; she only experiences belching with diarrhea. Otherwise, no upper GI symptoms. She denies any NSAIDs or aspirin powders. No fever, chills.    Past Medical History  Diagnosis Date  . MRSA (methicillin resistant staph aureus) culture positive     Past Surgical History  Procedure Date  . Laparoscopic gastric banding 12/12/08  . Appendectomy   . Hernia repair     umbilical  . Tonsillectomy   . Rotator cuff repair     bilateral  . Elbow surgery   . Abdominal hysterectomy     then ovarian removal  . Back surgery     X6  . Cervical fusion   . Removal of laparoscopic gastric band 01/02/10  . Carpal tunnel release     right [place]  . 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    Allergies as of 11/07/2010 - Review Complete 11/07/2010  Allergen Reaction Noted  . Vancomycin Hives 10/26/2010    Family History  Problem Relation Age of Onset  . Colon cancer Neg Hx     History   Social History  . Marital Status: Widowed    Spouse Name: N/A    Number of Children: N/A  . Years of Education: N/A   Occupational History  . 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 Smoking status: Never Smoker   . Smokeless tobacco: Not on file  .  Alcohol Use: Yes     once every 6 months  . Drug Use: No  . Sexually Active: Not on file    Review of Systems: Gen: Denies any fever, chills, loss of appetite, fatigue, weight loss. CV: Denies chest pain, heart palpitations, syncope, peripheral edema. Resp: Denies shortness of breath with rest, cough, wheezing GI: Denies dysphagia or odynophagia. Denies hematemesis, fecal incontinence, or jaundice.  GU : Denies urinary burning, urinary frequency, urinary incontinence.  MS: Denies joint pain, muscle weakness, cramps, limited movement Derm: Denies rash, itching, dry skin Psych: Denies depression, anxiety, confusion or memory loss  Heme: Denies bruising, bleeding, and enlarged lymph nodes.  Physical Exam: BP 125/67  Pulse 58  Temp(Src) 97.2 F (36.2 C) (Temporal)  Ht 5\' 3"  (1.6 m)  Wt 208 lb 12.8 oz (94.711 kg)  BMI 36.99 kg/m2 General:   Alert and oriented. Well-developed, well-nourished, pleasant and cooperative. Head:  Normocephalic and atraumatic. Eyes:  Conjunctiva pink, sclera clear, no icterus.   Conjunctiva pink. Ears:  Normal auditory acuity. Nose:  No deformity, discharge,  or lesions. Mouth:  No deformity or lesions, mucosa pink and moist.  Neck:  Supple, without mass or thyromegaly. Lungs:  Clear to auscultation bilaterally, without wheezing, rales, or rhonchi.  Heart:  S1, S2 present without murmurs noted.  Abdomen:  +  BS, soft, non-tender and non-distended. Without mass or HSM. No rebound or guarding. No hernias noted. Rectal:  Deferred  Msk:  Symmetrical without gross deformities. Normal posture. Pulses:  Normal pulses noted. Extremities:  Without clubbing or edema. Neurologic:  Alert and  oriented x4;  grossly normal neurologically. Skin:  Intact, warm and dry without significant lesions or rashes Cervical Nodes:  No significant cervical adenopathy. Psych:  Alert and cooperative. Normal mood and affect.

## 2010-11-08 ENCOUNTER — Ambulatory Visit (HOSPITAL_COMMUNITY)
Admission: RE | Admit: 2010-11-08 | Discharge: 2010-11-08 | Disposition: A | Discharge status: Home / Self Care | Payer: BC Managed Care – PPO | Source: Ambulatory Visit | Attending: Surgery | Admitting: Surgery

## 2010-11-08 DIAGNOSIS — Z6835 Body mass index (BMI) 35.0-35.9, adult: Secondary | ICD-10-CM | POA: Insufficient documentation

## 2010-11-08 DIAGNOSIS — Z01818 Encounter for other preprocedural examination: Secondary | ICD-10-CM | POA: Insufficient documentation

## 2010-11-08 NOTE — Progress Notes (Signed)
Quick Note:  Celiac negative. Awaiting stool studies. Is she set up for colonoscopy? I had requested at time of office visit, but I don't see this in epic. ______

## 2010-11-11 DIAGNOSIS — R197 Diarrhea, unspecified: Secondary | ICD-10-CM | POA: Insufficient documentation

## 2010-11-11 NOTE — Assessment & Plan Note (Signed)
60 year old Caucasian female with chronic diarrhea, no melena or brbpr. Reports sometimes weeks of diarrhea with multiple stools per day, with urgency. Worsened by chocolate, dairy, and lettuce. Lower abdominal cramping associated with loose stools. Will proceed with stool studies, celiac panel, and proceed with colonoscopy.  Low-fat, lactose free diet Probiotic daily, samples provided Stool culture, lactoferrin, Giardia, Cdiff PCR TTG, IgA and serum IgA Proceed with TCS with Dr. Jena Gauss in near future: the risks, benefits, and alternatives have been discussed with the patient in detail. The patient states understanding and desires to proceed.

## 2010-11-12 NOTE — Progress Notes (Signed)
Cc to PCP 

## 2010-11-16 ENCOUNTER — Ambulatory Visit (INDEPENDENT_AMBULATORY_CARE_PROVIDER_SITE_OTHER): Payer: Self-pay | Admitting: Surgery

## 2010-12-24 ENCOUNTER — Encounter: Payer: BC Managed Care – PPO | Attending: Surgery | Admitting: *Deleted

## 2010-12-24 ENCOUNTER — Ambulatory Visit: Payer: BC Managed Care – PPO | Admitting: *Deleted

## 2010-12-24 ENCOUNTER — Encounter: Payer: Self-pay | Admitting: *Deleted

## 2010-12-24 DIAGNOSIS — Z713 Dietary counseling and surveillance: Secondary | ICD-10-CM | POA: Insufficient documentation

## 2010-12-24 DIAGNOSIS — Z01818 Encounter for other preprocedural examination: Secondary | ICD-10-CM | POA: Insufficient documentation

## 2010-12-24 NOTE — Patient Instructions (Signed)
   Follow Pre-Op Nutrition Goals to prepare for LAGB Surgery.   Call the [facility] at [phone] once you have been given your surgery date to enrolled in the Pre-Op Nutrition Class. You will need to attend this nutrition class 3-4 weeks prior to your surgery. 

## 2010-12-24 NOTE — Progress Notes (Signed)
  Pre-Op Assessment Visit: Pre-Operative LAGB Surgery  Medical Nutrition Therapy:  Appt start time: 1655 end time:  1742.  Patient was seen on 12/24/2010 for Pre-Operative LAGB Nutrition Assessment. Assessment and letter of approval faxed to [facility] Bariatric Surgery Program coordinator on 12/24/2010.  Pt is a LAGB revision patient; band removed in 2011.  Handouts given during visit include:  Pre-Op Goals Handout  Patient to call for Pre-Op and Post-Op Nutrition Education at the [facility] when surgery is scheduled.

## 2011-06-25 ENCOUNTER — Telehealth: Payer: Self-pay | Admitting: Gastroenterology

## 2011-06-25 NOTE — Telephone Encounter (Signed)
Seen Sept 2012, was to be set up for TCS but wanted to hold off and call us later. Let's offer OV to see if she has reconsidered.

## 2011-07-10 ENCOUNTER — Encounter: Payer: Self-pay | Admitting: Internal Medicine

## 2011-07-10 NOTE — Telephone Encounter (Signed)
Letter mailed to patient to call office to set up OV °

## 2011-08-01 ENCOUNTER — Ambulatory Visit (INDEPENDENT_AMBULATORY_CARE_PROVIDER_SITE_OTHER): Payer: BC Managed Care – PPO | Admitting: General Surgery

## 2011-08-01 NOTE — Progress Notes (Signed)
Patient ID: Natalie Dodson, female   DOB: 12/26/1950, 61 y.o.   MRN: [id number]  Chief Complaint  Patient presents with  . Weight Loss Surgery    Gastric Sleeve initial    HPI Natalie Dodson is a 61 y.o. female.   HPI This patient is referred for evaluation of morbid obesity with a BMI of 37 with comorbidities of glucose intolerance and borderline diabetes, sleep apnea, arthritis, hyperlipidemia. She had a lab and placed by my partner in 2010 and she had excellent results and was down to a weight of 160 pounds approximately one year later she presented with problems with her bed in a port infection and she was found to have a band erosion subsequently her band was removed. Since then she suffered through the death of her husband and with all of her other issues she has regained the weight. She has arthritis which limits her physical activity but she has tried phentermine without significant improvement. She has only mild reflux symptoms when she eats too much in the evening time. She is interested in a sleeve gastrectomy. Past Medical History  Diagnosis Date  . MRSA (methicillin resistant staph aureus) culture positive     Past Surgical History  Procedure Date  . Laparoscopic gastric banding 12/12/08  . Appendectomy   . Hernia repair     umbilical  . Tonsillectomy   . Rotator cuff repair     bilateral  . Elbow surgery   . Abdominal hysterectomy     then ovarian removal  . Back surgery     X6  . Cervical fusion   . Removal of laparoscopic gastric band 01/02/10  . Carpal tunnel release     right [place]  . 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 Colon cancer Neg Hx     Social History History  Substance Use Topics  . Smoking status: Never Smoker   . Smokeless tobacco: Not on file  . Alcohol Use: Yes     once every 6 months    Allergies  Allergen Reactions  . Vancomycin Hives    Current Outpatient Prescriptions    Medication Sig Dispense Refill  . BIOGAIA PROBIOTIC (BIOGAIA PROBIOTIC) LIQD Take by mouth daily at 8 pm.          Review of Systems Review of Systems .anblros  Blood pressure 132/82, pulse 71, temperature 97 F (36.1 C), temperature source Temporal, resp. rate 16, height 5\' 3"  (1.6 m), weight 212 lb 12.8 oz (96.525 kg).  Physical Exam Physical Exam Physical Exam  Nursing note and vitals reviewed. Constitutional: She is oriented to person, place, and time. She appears well-developed and well-nourished. No distress.  HENT:  Head: Normocephalic and atraumatic.  Mouth/Throat: No oropharyngeal exudate.  Eyes: Conjunctivae and EOM are normal. Pupils are equal, round, and reactive to light. Right eye exhibits no discharge. Left eye exhibits no discharge. No scleral icterus.  Neck: Normal range of motion. Neck supple. No tracheal deviation present.  Cardiovascular: Normal rate, regular rhythm, normal heart sounds and intact distal pulses.   Pulmonary/Chest: Effort normal and breath sounds normal. No stridor. No respiratory distress. She has no wheezes.  Abdominal: Soft. Bowel sounds are normal. She exhibits no distension and no mass. There is no tenderness. There is no rebound and no guarding.  Musculoskeletal: Normal range of motion. She exhibits no edema and no tenderness.  Neurological: She is alert and oriented to  person, place, and time.  Skin: Skin is warm and dry. No rash noted. She is not diaphoretic. No erythema. No pallor.  Psychiatric: She has a normal mood and affect. Her behavior is normal. Judgment and thought content normal.    Data Reviewed UGI, endoscopy reports  Assessment    Morbid obesity with comorbidities of sleep apnea, borderline diabetes, arthritis, and hyperlipidemia. I think that she certainly would benefit from the weight loss. However her situation is more complicated given her prior surgeries. She is interested in a sleeve gastrectomy. I think that this  would be fine however I had a long discussion with her regarding the risks of the procedure and potential complications with certainly be higher in her case. She has a fundoplication which is still present as well as omental patch in the area of her previous band of and take this down laparoscopic we then performed the gastrectomy may be very difficult for her. There would be a high chance of conversion to open and significantly higher leak rate as well. She expressed understanding of this and would like to proceed with the workup and evaluation for possible sleeve gastrectomy. We discussed the other is the procedure and she is interested in pursuing with the gastrectomy. She understands that she may require conversion to Roux-en-Y gastric bypass in the future for complications such as persistent reflux or stricture. Again, and I did explain that she would be high risk for complications primarily gastric leak.    Plan    I will review her case with my partner Dr. Ezzard Standing who would likely want to be present for her procedure. We will review her labs and her images and continue with the preoperative workup.       Lodema Pilot DAVID 08/01/2011, 11:44 AM

## 2011-08-02 ENCOUNTER — Encounter (INDEPENDENT_AMBULATORY_CARE_PROVIDER_SITE_OTHER): Payer: Self-pay | Admitting: General Surgery

## 2011-08-16 ENCOUNTER — Other Ambulatory Visit (INDEPENDENT_AMBULATORY_CARE_PROVIDER_SITE_OTHER): Payer: Self-pay | Admitting: General Surgery

## 2011-09-27 ENCOUNTER — Other Ambulatory Visit (INDEPENDENT_AMBULATORY_CARE_PROVIDER_SITE_OTHER): Payer: Self-pay | Admitting: General Surgery

## 2011-10-29 ENCOUNTER — Encounter (HOSPITAL_COMMUNITY): Payer: Self-pay | Admitting: Pharmacy Technician

## 2011-11-01 ENCOUNTER — Ambulatory Visit (INDEPENDENT_AMBULATORY_CARE_PROVIDER_SITE_OTHER): Payer: BC Managed Care – PPO | Admitting: General Surgery

## 2011-11-01 ENCOUNTER — Encounter (INDEPENDENT_AMBULATORY_CARE_PROVIDER_SITE_OTHER): Payer: Self-pay | Admitting: General Surgery

## 2011-11-01 NOTE — Progress Notes (Signed)
Patient ID: Natalie Dodson, female   DOB: 11/30/1950, 61 y.o.   MRN: [id number]  Chief Complaint  Patient presents with  . Bariatric Pre-op    HPI Natalie Dodson is a 61 y.o. female.  This patient presents for preoperative visit for takedown of fundoplication and vertical sleeve gastrectomy for morbid obesity. She had a lap band placed in 2000 his she subsequently developed a band erosion which required band removal in 2011. She had done well in the weight loss that point but since her band has been removed, she has regained some weight and she is interested in surgical weight loss options. And she is still interested in a sleeve gastrectomy. HPI  Past Medical History  Diagnosis Date  . MRSA (methicillin resistant staph aureus) culture positive     Past Surgical History  Procedure Date  . Laparoscopic gastric banding 12/12/08  . Appendectomy   . Hernia repair     umbilical  . Tonsillectomy   . Rotator cuff repair     bilateral  . Elbow surgery   . Abdominal hysterectomy     then ovarian removal  . Back surgery     X6  . Cervical fusion   . Removal of laparoscopic gastric band 01/02/10  . Carpal tunnel release     right [place]  . 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 Colon cancer Neg Hx     Social History History  Substance Use Topics  . Smoking status: Never Smoker   . Smokeless tobacco: Not on file  . Alcohol Use: Yes     once every 6 months    Allergies  Allergen Reactions  . Vancomycin Hives    Can spread all over the body.    Current Outpatient Prescriptions  Medication Sig Dispense Refill  . BIOGAIA PROBIOTIC (BIOGAIA PROBIOTIC) LIQD Take by mouth daily at 8 pm.        . naproxen sodium (ANAPROX) 220 MG tablet Take 220 mg by mouth 2 (two) times daily with a meal.        Review of Systems Review of Systems All other review of systems negative except for diarrhea or noncontributory except as stated in  the HPI  Blood pressure 138/70, pulse 70, temperature 97.6 F (36.4 C), temperature source Temporal, resp. rate 16, height 5' 3.5" (1.613 m), weight 210 lb 2 oz (95.312 kg).  Physical Exam Physical Exam Physical Exam  Nursing note and vitals reviewed. Constitutional: She is oriented to person, place, and time. She appears well-developed and well-nourished. No distress.  HENT:  Head: Normocephalic and atraumatic.  Mouth/Throat: No oropharyngeal exudate.  Eyes: Conjunctivae and EOM are normal. Pupils are equal, round, and reactive to light. Right eye exhibits no discharge. Left eye exhibits no discharge. No scleral icterus.  Neck: Normal range of motion. Neck supple. No tracheal deviation present.  Cardiovascular: Normal rate, regular rhythm, normal heart sounds and intact distal pulses.   Pulmonary/Chest: Effort normal and breath sounds normal. No stridor. No respiratory distress. She has no wheezes.  Abdominal: Soft. Bowel sounds are normal. She exhibits no distension and no mass. There is no tenderness. There is no rebound and no guarding.  Musculoskeletal: Normal range of motion. She exhibits no edema and no tenderness.  Neurological: She is alert and oriented to person, place, and time.  Skin: Skin is warm and dry. No rash noted. She is not diaphoretic. No erythema. No   pallor.  Psychiatric: She has a normal mood and affect. Her behavior is normal. Judgment and thought content normal.    Data Reviewed   Assessment    morbid obesity We are planning for takedown of her fundoplication and vertical sleeve gastrectomy next week.  I again discussed with her the high risk nature of revisional surgery especially since had erosion prior.  I discussed with her the risk for stricture and leak.  The risks of infection, bleeding, pain, scarring, weight regain, too little or too much weight loss, vitamin deficiencies and need for lifelong vitamin supplementation, hair loss, need for protein  supplementation, leaks, stricture, reflux, food intolerance, need for reoperation and conversion to roux Y gastric bypass, need for open surgery, injury to spleen or surrounding structures, DVT's, PE, and death again discussed with the patient and the patient expressed understanding and desires to proceed with laparoscopic vertical sleeve gastrectomy, possible open, intraoperative endoscopy.      Plan    We will plan on vertical sleeve gastrectomy one week from Tuesday.       Yared Susan DAVID 11/01/2011, 10:11 AM    

## 2011-11-05 NOTE — Patient Instructions (Addendum)
20 Natalie Dodson  11/05/2011   Your procedure is scheduled on: 9-10  -2013  Report to [facility] at      0515  AM.  Call this number if you have problems the morning of surgery: [phone]  Or prior to : Presurgical testing [phone]   Remember:   Do not eat food:After Midnight.   Take these medicines the morning of surgery with A SIP OF WATER: none   Do not wear jewelry, make-up or nail polish.  Do not wear lotions, powders, or perfumes. You may wear deodorant.  Do not shave 48 hours prior to surgery.(face and neck okay, no shaving of legs)  Do not bring valuables to the hospital.  Contacts, dentures or bridgework may not be worn into surgery.  Leave suitcase in the car. After surgery it may be brought to your room.  For patients admitted to the hospital, checkout time is 11:00 AM the day of discharge.   Patients discharged the day of surgery will not be allowed to drive home.  Name and phone number of your driver: Makayah Pauli, boyfriend- [phone]  Special Instructions: CHG Shower Use Special Wash: 1/2 bottle night before surgery and 1/2 bottle morning of surgery.(avoid face and genitals)   Please read over the following fact sheets that you were given: MRSA Information.

## 2011-11-06 ENCOUNTER — Encounter (HOSPITAL_COMMUNITY): Payer: Self-pay

## 2011-11-06 ENCOUNTER — Encounter (HOSPITAL_COMMUNITY)
Admission: RE | Admit: 2011-11-06 | Discharge: 2011-11-06 | Disposition: A | Discharge status: Home / Self Care | Payer: BC Managed Care – PPO | Source: Ambulatory Visit | Attending: General Surgery | Admitting: General Surgery

## 2011-11-06 DIAGNOSIS — R112 Nausea with vomiting, unspecified: Secondary | ICD-10-CM

## 2011-11-06 DIAGNOSIS — Z22322 Carrier or suspected carrier of Methicillin resistant Staphylococcus aureus: Secondary | ICD-10-CM

## 2011-11-06 DIAGNOSIS — Z9889 Other specified postprocedural states: Secondary | ICD-10-CM

## 2011-11-06 HISTORY — DX: Other specified postprocedural states: Z98.890

## 2011-11-06 HISTORY — DX: Nausea with vomiting, unspecified: R11.2

## 2011-11-06 HISTORY — DX: Carrier or suspected carrier of methicillin resistant Staphylococcus aureus: Z22.322

## 2011-11-06 HISTORY — PX: ELBOW SURGERY: SHX618

## 2011-11-06 LAB — COMPREHENSIVE METABOLIC PANEL
ALT: 22 U/L (ref 0–35)
Alkaline Phosphatase: 86 U/L (ref 39–117)
CO2: 27 mEq/L (ref 19–32)
Chloride: 107 mEq/L (ref 96–112)
GFR calc Af Amer: >90 mL/min (ref >90)
GFR calc non Af Amer: 78 mL/min — ABNORMAL LOW (ref >90)
Glucose, Bld: 96 mg/dL (ref 70–99)
Potassium: 4.8 mEq/L (ref 3.5–5.1)
Sodium: 142 mEq/L (ref 135–145)

## 2011-11-06 LAB — DIFFERENTIAL
Basophils Absolute: 0 10*3/uL (ref 0.0–0.1)
Eosinophils Absolute: 0.1 10*3/uL (ref 0.0–0.7)
Eosinophils Relative: 2 % (ref 0–5)
Lymphs Abs: 2.4 10*3/uL (ref 0.7–4.0)

## 2011-11-06 LAB — CBC
Hemoglobin: 13.5 g/dL (ref 12.0–15.0)
RBC: 4.64 MIL/uL (ref 3.87–5.11)

## 2011-11-06 NOTE — Pre-Procedure Instructions (Signed)
11-06-11 1220 Pt. Notified of positive Staph aureus PCR screen result-will fill RX for Mupirocin Ointment and use as directed. W. Kennon Portela

## 2011-11-07 ENCOUNTER — Encounter: Payer: BC Managed Care – PPO | Attending: Surgery | Admitting: *Deleted

## 2011-11-07 ENCOUNTER — Encounter: Payer: Self-pay | Admitting: *Deleted

## 2011-11-07 DIAGNOSIS — Z01818 Encounter for other preprocedural examination: Secondary | ICD-10-CM | POA: Insufficient documentation

## 2011-11-07 DIAGNOSIS — Z713 Dietary counseling and surveillance: Secondary | ICD-10-CM | POA: Insufficient documentation

## 2011-11-07 NOTE — Progress Notes (Signed)
Bariatric Class:  Appt start time: 1730 end time:  1830.  Pre-Operative Nutrition Class  Patient was seen on 11/07/2011 for Pre-Operative Bariatric Surgery Education at [facility].   Surgery date: 11/12/11 Surgery type: Sleeve Start weight at [facility]: 203.4 lbs  Weight today: 207.8 lbs  Samples given per MNT protocol: Bariatric Advantage Multivitamin  Lot # [id number]  Exp: 12/13   Opurity Calcium Citrate  Lot # [id number]  Exp: 11/14   Opurity Band-Optimized MVI  Lot # [id number]  Exp: 02/14   Celebrate Vitamins Multivitamin (2)  Lot # [id number]; 2130Q6  Exp: 09/14; 01/15   Celebrate Vitamins Multivitamin-Complete  Lot # [id number]  Exp: 06/14   Celebrate Vitamins Iron + C (18mg )  Lot # [id number]  Exp: 03/15   Celebrate Vitamins Sublingual B12  Lot # [id number]  Exp: 05/15   Corliss Marcus Protein Powder  Lot # [id number]  Exp: 09/14  The following the learning objective met by the patient during this course:  Identifies Pre-Op Dietary Goals and will begin 2 weeks pre-operatively  Identifies appropriate sources of fluids and proteins   States protein recommendations and appropriate sources pre and post-operatively  Identifies Post-Operative Dietary Goals and will follow for 2 weeks post-operatively  Identifies appropriate multivitamin and calcium sources  Describes the need for physical activity post-operatively and will follow MD recommendations  States when to call healthcare provider regarding medication questions or post-operative complications  Handouts given during class include:  Pre-Op Bariatric Surgery Diet Handout  Protein Shake Handout  Post-Op Bariatric Surgery Nutrition Handout  BELT Program Information Flyer  Support Group Information Flyer  Follow-Up Plan: Patient will follow-up at [facility] 2 weeks post operatively for diet advancement per MD.

## 2011-11-07 NOTE — Patient Instructions (Signed)
Follow:   Pre-Op Diet per MD 2 weeks prior to surgery  Phase 2- Liquids (clear/full) 2 weeks after surgery  Vitamin/Mineral/Calcium guidelines for purchasing bariatric supplements  Exercise guidelines pre and post-op per MD  Follow-up at [facility] in 2 weeks post-op for diet advancement. Contact Aina Rossbach as needed with questions/concerns. 

## 2011-11-12 ENCOUNTER — Encounter (HOSPITAL_COMMUNITY): Payer: Self-pay | Admitting: *Deleted

## 2011-11-12 ENCOUNTER — Inpatient Hospital Stay (HOSPITAL_COMMUNITY)
Admission: RE | Admit: 2011-11-12 | Discharge: 2011-11-14 | DRG: 288 | Disposition: A | Discharge status: Home / Self Care | Payer: BC Managed Care – PPO | Source: Ambulatory Visit | Attending: General Surgery | Admitting: General Surgery

## 2011-11-12 ENCOUNTER — Encounter (HOSPITAL_COMMUNITY): Payer: Self-pay | Admitting: Certified Registered Nurse Anesthetist

## 2011-11-12 ENCOUNTER — Encounter (HOSPITAL_COMMUNITY)
Admission: RE | Disposition: A | Discharge status: Home / Self Care | Payer: Self-pay | Source: Ambulatory Visit | Attending: General Surgery

## 2011-11-12 ENCOUNTER — Inpatient Hospital Stay (HOSPITAL_COMMUNITY): Payer: BC Managed Care – PPO | Admitting: Certified Registered Nurse Anesthetist

## 2011-11-12 DIAGNOSIS — Z23 Encounter for immunization: Secondary | ICD-10-CM

## 2011-11-12 DIAGNOSIS — Z6836 Body mass index (BMI) 36.0-36.9, adult: Secondary | ICD-10-CM

## 2011-11-12 DIAGNOSIS — Z881 Allergy status to other antibiotic agents status: Secondary | ICD-10-CM

## 2011-11-12 DIAGNOSIS — Z9089 Acquired absence of other organs: Secondary | ICD-10-CM

## 2011-11-12 DIAGNOSIS — Z90710 Acquired absence of both cervix and uterus: Secondary | ICD-10-CM

## 2011-11-12 SURGERY — GASTRECTOMY, SLEEVE, LAPAROSCOPIC
Anesthesia: General | Wound class: Clean Contaminated

## 2011-11-12 MED ORDER — BUPIVACAINE HCL 0.25 % IJ SOLN
INTRAMUSCULAR | Status: DC | PRN
  Administered 2011-11-12: 20 mL

## 2011-11-12 MED ORDER — MEPERIDINE HCL 50 MG/ML IJ SOLN: 6.2500 mg | INTRAMUSCULAR | Status: DC | PRN

## 2011-11-12 MED ORDER — PROMETHAZINE HCL 25 MG/ML IJ SOLN: 6.2500 mg | INTRAMUSCULAR | Status: DC | PRN

## 2011-11-12 MED ORDER — ONDANSETRON HCL 4 MG/2ML IJ SOLN
4.0000 mg | INTRAMUSCULAR | Status: DC | PRN
  Administered 2011-11-12: 4 mg via INTRAVENOUS
  Filled 2011-11-12: qty 2

## 2011-11-12 MED ORDER — SCOPOLAMINE 1 MG/3DAYS TD PT72
MEDICATED_PATCH | TRANSDERMAL | Status: DC | PRN
  Administered 2011-11-12: 1 via TRANSDERMAL

## 2011-11-12 MED ORDER — HYDROMORPHONE HCL PF 1 MG/ML IJ SOLN
INTRAMUSCULAR | Status: DC | PRN
  Administered 2011-11-12: 0.5 mg via INTRAVENOUS

## 2011-11-12 MED ORDER — PROPOFOL 10 MG/ML IV BOLUS
INTRAVENOUS | Status: DC | PRN
  Administered 2011-11-12: 150 mg via INTRAVENOUS

## 2011-11-12 MED ORDER — DEXAMETHASONE SODIUM PHOSPHATE 10 MG/ML IJ SOLN
INTRAMUSCULAR | Status: DC | PRN
  Administered 2011-11-12: 10 mg via INTRAVENOUS

## 2011-11-12 MED ORDER — LACTATED RINGERS IV SOLN: INTRAVENOUS | Status: DC

## 2011-11-12 MED ORDER — GLYCOPYRROLATE 0.2 MG/ML IJ SOLN
INTRAMUSCULAR | Status: DC | PRN
  Administered 2011-11-12: .6 mg via INTRAVENOUS

## 2011-11-12 MED ORDER — TISSEEL VH 10 ML EX KIT
PACK | CUTANEOUS | Status: DC | PRN
  Administered 2011-11-12: 10 mL

## 2011-11-12 MED ORDER — 0.9 % SODIUM CHLORIDE (POUR BTL) OPTIME
TOPICAL | Status: DC | PRN
  Administered 2011-11-12: 1000 mL

## 2011-11-12 MED ORDER — SODIUM CHLORIDE 0.9 % IV SOLN
1.0000 g | INTRAVENOUS | Status: AC
  Administered 2011-11-12: 1 g via INTRAVENOUS

## 2011-11-12 MED ORDER — ONDANSETRON HCL 4 MG/2ML IJ SOLN
INTRAMUSCULAR | Status: DC | PRN
  Administered 2011-11-12: 4 mg via INTRAVENOUS

## 2011-11-12 MED ORDER — ESMOLOL HCL 10 MG/ML IV SOLN
INTRAVENOUS | Status: DC | PRN
  Administered 2011-11-12: 20 mg via INTRAVENOUS

## 2011-11-12 MED ORDER — NEOSTIGMINE METHYLSULFATE 1 MG/ML IJ SOLN
INTRAMUSCULAR | Status: DC | PRN
  Administered 2011-11-12: 5 mg via INTRAVENOUS

## 2011-11-12 MED ORDER — ROCURONIUM BROMIDE 100 MG/10ML IV SOLN
INTRAVENOUS | Status: DC | PRN
  Administered 2011-11-12 (×2): 10 mg via INTRAVENOUS
  Administered 2011-11-12: 40 mg via INTRAVENOUS
  Administered 2011-11-12 (×2): 10 mg via INTRAVENOUS

## 2011-11-12 MED ORDER — KCL IN DEXTROSE-NACL 20-5-0.45 MEQ/L-%-% IV SOLN
INTRAVENOUS | Status: DC
  Administered 2011-11-12 – 2011-11-14 (×5): via INTRAVENOUS
  Filled 2011-11-12 (×6): qty 1000

## 2011-11-12 MED ORDER — LACTATED RINGERS IV SOLN
INTRAVENOUS | Status: DC | PRN
  Administered 2011-11-12: 07:00:00 via INTRAVENOUS

## 2011-11-12 MED ORDER — ACETAMINOPHEN 10 MG/ML IV SOLN
INTRAVENOUS | Status: DC | PRN
  Administered 2011-11-12: 1000 mg via INTRAVENOUS

## 2011-11-12 MED ORDER — EPHEDRINE SULFATE 50 MG/ML IJ SOLN
INTRAMUSCULAR | Status: DC | PRN
  Administered 2011-11-12: 10 mg via INTRAVENOUS

## 2011-11-12 MED ORDER — LIDOCAINE-EPINEPHRINE 1 %-1:100000 IJ SOLN
INTRAMUSCULAR | Status: DC | PRN
  Administered 2011-11-12: 20 mL

## 2011-11-12 MED ORDER — MORPHINE SULFATE 2 MG/ML IJ SOLN
2.0000 mg | INTRAMUSCULAR | Status: DC | PRN
  Administered 2011-11-12: 2 mg via INTRAVENOUS
  Administered 2011-11-12 (×3): 4 mg via INTRAVENOUS
  Administered 2011-11-13 (×2): 2 mg via INTRAVENOUS
  Filled 2011-11-12: qty 2
  Filled 2011-11-12: qty 1
  Filled 2011-11-12 (×2): qty 2
  Filled 2011-11-12: qty 1
  Filled 2011-11-12: qty 2

## 2011-11-12 MED ORDER — OXYCODONE-ACETAMINOPHEN 5-325 MG/5ML PO SOLN
5.0000 mL | ORAL | Status: DC | PRN
  Administered 2011-11-13: 5 mL via ORAL
  Filled 2011-11-12 (×2): qty 5

## 2011-11-12 MED ORDER — SUCCINYLCHOLINE CHLORIDE 20 MG/ML IJ SOLN
INTRAMUSCULAR | Status: DC | PRN
  Administered 2011-11-12: 100 mg via INTRAVENOUS

## 2011-11-12 MED ORDER — ACETAMINOPHEN 160 MG/5ML PO SOLN: 650.0000 mg | ORAL | Status: DC | PRN

## 2011-11-12 MED ORDER — LACTATED RINGERS IR SOLN
Status: DC | PRN
  Administered 2011-11-12: 1

## 2011-11-12 MED ORDER — LIDOCAINE HCL (CARDIAC) 20 MG/ML IV SOLN
INTRAVENOUS | Status: DC | PRN
  Administered 2011-11-12: 100 mg via INTRAVENOUS

## 2011-11-12 MED ORDER — PROPOFOL 10 MG/ML IV EMUL
INTRAVENOUS | Status: DC | PRN
  Administered 2011-11-12: 150 mg via INTRAVENOUS

## 2011-11-12 MED ORDER — HYDROMORPHONE HCL PF 1 MG/ML IJ SOLN
0.2500 mg | INTRAMUSCULAR | Status: DC | PRN
  Administered 2011-11-12 (×2): 0.5 mg via INTRAVENOUS

## 2011-11-12 MED ORDER — HEPARIN SODIUM (PORCINE) 5000 UNIT/ML IJ SOLN
5000.0000 [IU] | Freq: Once | INTRAMUSCULAR | Status: AC
  Administered 2011-11-12: 5000 [IU] via SUBCUTANEOUS
  Filled 2011-11-12: qty 1

## 2011-11-12 MED ORDER — MIDAZOLAM HCL 5 MG/5ML IJ SOLN
INTRAMUSCULAR | Status: DC | PRN
  Administered 2011-11-12: 2 mg via INTRAVENOUS

## 2011-11-12 MED ORDER — FENTANYL CITRATE 0.05 MG/ML IJ SOLN
INTRAMUSCULAR | Status: DC | PRN
  Administered 2011-11-12 (×7): 50 ug via INTRAVENOUS

## 2011-11-12 MED ORDER — ENOXAPARIN SODIUM 40 MG/0.4ML ~~LOC~~ SOLN
40.0000 mg | SUBCUTANEOUS | Status: DC
  Administered 2011-11-13: 40 mg via SUBCUTANEOUS
  Filled 2011-11-12 (×3): qty 0.4

## 2011-11-12 SURGICAL SUPPLY — 61 items
ADH SKN CLS APL DERMABOND .7 (GAUZE/BANDAGES/DRESSINGS) ×2
APL SRG 32X5 SNPLK LF DISP (MISCELLANEOUS) ×1
APPLICATOR COTTON TIP 6IN STRL (MISCELLANEOUS) IMPLANT
APPLIER CLIP ROT 10 11.4 M/L (STAPLE)
APR CLP MED LRG 11.4X10 (STAPLE)
BAG SPEC RTRVL LRG 6X4 10 (ENDOMECHANICALS)
CABLE HIGH FREQUENCY MONO STRZ (ELECTRODE) ×3 IMPLANT
CANISTER SUCTION 2500CC (MISCELLANEOUS) ×3 IMPLANT
CHLORAPREP W/TINT 26ML (MISCELLANEOUS) ×6 IMPLANT
CLIP APPLIE ROT 10 11.4 M/L (STAPLE) IMPLANT
CLOTH BEACON ORANGE TIMEOUT ST (SAFETY) ×3 IMPLANT
COVER SURGICAL LIGHT HANDLE (MISCELLANEOUS) ×3 IMPLANT
DERMABOND ADVANCED (GAUZE/BANDAGES/DRESSINGS) ×1
DERMABOND ADVANCED .7 DNX12 (GAUZE/BANDAGES/DRESSINGS) ×2 IMPLANT
DEVICE SUTURE ENDOST 10MM (ENDOMECHANICALS) IMPLANT
DRAIN CHANNEL 19F RND (DRAIN) ×3 IMPLANT
DRAPE LAPAROSCOPIC ABDOMINAL (DRAPES) ×3 IMPLANT
ELECT REM PT RETURN 9FT ADLT (ELECTROSURGICAL) ×3
ELECTRODE REM PT RTRN 9FT ADLT (ELECTROSURGICAL) ×2 IMPLANT
EVACUATOR DRAINAGE 10X20 100CC (DRAIN) ×2 IMPLANT
EVACUATOR SILICONE 100CC (DRAIN) ×2
GLOVE SURG SS PI 7.5 STRL IVOR (GLOVE) ×6 IMPLANT
GOWN STRL NON-REIN LRG LVL3 (GOWN DISPOSABLE) ×3 IMPLANT
GOWN STRL REIN XL XLG (GOWN DISPOSABLE) ×9 IMPLANT
HANDLE STAPLE EGIA 4 XL (STAPLE) ×3 IMPLANT
HOVERMATT SINGLE USE (MISCELLANEOUS) ×3 IMPLANT
KIT BASIN OR (CUSTOM PROCEDURE TRAY) ×3 IMPLANT
NEEDLE SPNL 22GX3.5 QUINCKE BK (NEEDLE) IMPLANT
NS IRRIG 1000ML POUR BTL (IV SOLUTION) ×3 IMPLANT
PEN SKIN MARKING BROAD (MISCELLANEOUS) ×3 IMPLANT
PENCIL BUTTON HOLSTER BLD 10FT (ELECTRODE) ×3 IMPLANT
POUCH SPECIMEN RETRIEVAL 10MM (ENDOMECHANICALS) IMPLANT
RELOAD BLACK 60MM ECHELON (STAPLE) IMPLANT
RELOAD EGIA 60 MED/THCK PURPLE (STAPLE) ×3 IMPLANT
RELOAD GREEN (STAPLE) IMPLANT
RELOAD TRI 2.0 60 XTHK VAS SUL (STAPLE) ×15 IMPLANT
SCISSORS LAP 5X35 DISP (ENDOMECHANICALS) ×3 IMPLANT
SEALANT SURGICAL APPL DUAL CAN (MISCELLANEOUS) ×3 IMPLANT
SET IRRIG TUBING LAPAROSCOPIC (IRRIGATION / IRRIGATOR) ×3 IMPLANT
SHEARS CURVED HARMONIC AC 45CM (MISCELLANEOUS) ×3 IMPLANT
SLEEVE XCEL OPT CAN 5 100 (ENDOMECHANICALS) ×9 IMPLANT
SOLUTION ANTI FOG 6CC (MISCELLANEOUS) ×3 IMPLANT
SPONGE GAUZE 4X4 12PLY (GAUZE/BANDAGES/DRESSINGS) ×3 IMPLANT
SPONGE LAP 18X18 X RAY DECT (DISPOSABLE) IMPLANT
STAPLE ECHEON FLEX 60 POW ENDO (STAPLE) IMPLANT
STRIP PERI DRY VERITAS 60 (STAPLE) IMPLANT
SUT ETHILON 2 0 PS N (SUTURE) ×3 IMPLANT
SUT MNCRL AB 4-0 PS2 18 (SUTURE) ×3 IMPLANT
SUT VICRYL 0 UR6 27IN ABS (SUTURE) ×3 IMPLANT
SYR 50ML LL SCALE MARK (SYRINGE) IMPLANT
TAPE CLOTH SURG 4X10 WHT LF (GAUZE/BANDAGES/DRESSINGS) ×3 IMPLANT
TIP INNERVISION DETACH 40FR (MISCELLANEOUS) ×2 IMPLANT
TIPS INNERVISION DETACH 40FR (MISCELLANEOUS) ×1
TRAY FOLEY CATH 14FRSI W/METER (CATHETERS) ×3 IMPLANT
TRAY LAP CHOLE (CUSTOM PROCEDURE TRAY) ×3 IMPLANT
TROCAR BLADELESS 15MM (ENDOMECHANICALS) ×3 IMPLANT
TROCAR BLADELESS OPT 5 100 (ENDOMECHANICALS) ×3 IMPLANT
TROCAR HASSON GELL 12X100 (TROCAR) ×3 IMPLANT
TUBING CONNECTING 10 (TUBING) IMPLANT
TUBING ENDO SMARTCAP (MISCELLANEOUS) IMPLANT
TUBING FILTER THERMOFLATOR (ELECTROSURGICAL) ×3 IMPLANT

## 2011-11-12 NOTE — H&P (View-Only) (Signed)
Patient ID: Natalie Dodson, female   DOB: 1950/07/11, 61 y.o.   MRN: [id number]  Chief Complaint  Patient presents with  . Bariatric Pre-op    HPI Natalie Dodson is a 61 y.o. female.  This patient presents for preoperative visit for takedown of fundoplication and vertical sleeve gastrectomy for morbid obesity. She had a lap band placed in 2000 his she subsequently developed a band erosion which required band removal in 2011. She had done well in the weight loss that point but since her band has been removed, she has regained some weight and she is interested in surgical weight loss options. And she is still interested in a sleeve gastrectomy. HPI  Past Medical History  Diagnosis Date  . MRSA (methicillin resistant staph aureus) culture positive     Past Surgical History  Procedure Date  . Laparoscopic gastric banding 12/12/08  . Appendectomy   . Hernia repair     umbilical  . Tonsillectomy   . Rotator cuff repair     bilateral  . Elbow surgery   . Abdominal hysterectomy     then ovarian removal  . Back surgery     X6  . Cervical fusion   . Removal of laparoscopic gastric band 01/02/10  . Carpal tunnel release     right [place]  . 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 Colon cancer Neg Hx     Social History History  Substance Use Topics  . Smoking status: Never Smoker   . Smokeless tobacco: Not on file  . Alcohol Use: Yes     once every 6 months    Allergies  Allergen Reactions  . Vancomycin Hives    Can spread all over the body.    Current Outpatient Prescriptions  Medication Sig Dispense Refill  . BIOGAIA PROBIOTIC (BIOGAIA PROBIOTIC) LIQD Take by mouth daily at 8 pm.        . naproxen sodium (ANAPROX) 220 MG tablet Take 220 mg by mouth 2 (two) times daily with a meal.        Review of Systems Review of Systems All other review of systems negative except for diarrhea or noncontributory except as stated in  the HPI  Blood pressure 138/70, pulse 70, temperature 97.6 F (36.4 C), temperature source Temporal, resp. rate 16, height 5' 3.5" (1.613 m), weight 210 lb 2 oz (95.312 kg).  Physical Exam Physical Exam Physical Exam  Nursing note and vitals reviewed. Constitutional: She is oriented to person, place, and time. She appears well-developed and well-nourished. No distress.  HENT:  Head: Normocephalic and atraumatic.  Mouth/Throat: No oropharyngeal exudate.  Eyes: Conjunctivae and EOM are normal. Pupils are equal, round, and reactive to light. Right eye exhibits no discharge. Left eye exhibits no discharge. No scleral icterus.  Neck: Normal range of motion. Neck supple. No tracheal deviation present.  Cardiovascular: Normal rate, regular rhythm, normal heart sounds and intact distal pulses.   Pulmonary/Chest: Effort normal and breath sounds normal. No stridor. No respiratory distress. She has no wheezes.  Abdominal: Soft. Bowel sounds are normal. She exhibits no distension and no mass. There is no tenderness. There is no rebound and no guarding.  Musculoskeletal: Normal range of motion. She exhibits no edema and no tenderness.  Neurological: She is alert and oriented to person, place, and time.  Skin: Skin is warm and dry. No rash noted. She is not diaphoretic. No erythema. No  pallor.  Psychiatric: She has a normal mood and affect. Her behavior is normal. Judgment and thought content normal.    Data Reviewed   Assessment    morbid obesity We are planning for takedown of her fundoplication and vertical sleeve gastrectomy next week.  I again discussed with her the high risk nature of revisional surgery especially since had erosion prior.  I discussed with her the risk for stricture and leak.  The risks of infection, bleeding, pain, scarring, weight regain, too little or too much weight loss, vitamin deficiencies and need for lifelong vitamin supplementation, hair loss, need for protein  supplementation, leaks, stricture, reflux, food intolerance, need for reoperation and conversion to roux Y gastric bypass, need for open surgery, injury to spleen or surrounding structures, DVT's, PE, and death again discussed with the patient and the patient expressed understanding and desires to proceed with laparoscopic vertical sleeve gastrectomy, possible open, intraoperative endoscopy.      Plan    We will plan on vertical sleeve gastrectomy one week from Tuesday.       Lodema Pilot DAVID 11/01/2011, 10:11 AM

## 2011-11-12 NOTE — Brief Op Note (Signed)
11/12/2011  11:09 AM  PATIENT:  Natalie Dodson  61 y.o. 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   POST-OPERATIVE DIAGNOSIS:  revision of lap band to lap sleeve-non functioning lap band  PROCEDURE:  Procedure(s) (LRB) with comments: LAPAROSCOPIC GASTRIC SLEEVE RESECTION (N/A) UPPER GI ENDOSCOPY ()  SURGEON:  Surgeon(s) and Role:    * Lodema Pilot, DO - Primary    * Kandis Cocking, MD - Assisting  PHYSICIAN ASSISTANT:   ASSISTANTS: Newman   ANESTHESIA:   general  EBL:  Total I/O In: 2600 [I.V.:2600] Out: 275 [Urine:250; Blood:25]  BLOOD ADMINISTERED:none  DRAINS: (43F) Jackson-Pratt drain(s) with closed bulb suction in the along the gastric staple line   LOCAL MEDICATIONS USED:  MARCAINE    and LIDOCAINE   SPECIMEN:  Source of Specimen:  greater curve of stomach  DISPOSITION OF SPECIMEN:  PATHOLOGY  COUNTS:  YES  TOURNIQUET:  * No tourniquets in log *  DICTATION: .Other Dictation: Dictation Number   PLAN OF CARE: Admit to inpatient   PATIENT DISPOSITION:  PACU - hemodynamically stable.   Delay start of Pharmacological VTE agent (>24hrs) due to surgical blood loss or risk of bleeding: no

## 2011-11-12 NOTE — Anesthesia Postprocedure Evaluation (Signed)
  Anesthesia Post-op Note  Patient: Natalie Dodson  Procedure(s) Performed: Procedure(s) (LRB): LAPAROSCOPIC GASTRIC SLEEVE RESECTION (N/A) UPPER GI ENDOSCOPY ()  Patient Location: PACU  Anesthesia Type: General  Level of Consciousness: awake and alert   Airway and Oxygen Therapy: Patient Spontanous Breathing  Post-op Pain: mild  Post-op Assessment: Post-op Vital signs reviewed, Patient's Cardiovascular Status Stable, Respiratory Function Stable, Patent Airway and No signs of Nausea or vomiting  Post-op Vital Signs: stable  Complications: No apparent anesthesia complications

## 2011-11-12 NOTE — Interval H&P Note (Signed)
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 After consideration of risks, benefits and other options for treatment, the patient has consented to  Procedure(s) (LRB) with comments: LAPAROSCOPIC GASTRIC SLEEVE RESECTION (N/A) - Laproscopic Ventral Sleeve Gastrectomy with Upper Endoscopy Possible Open  as a surgical intervention .  The patient's history has been reviewed, patient examined, no change in status, stable for surgery.  I have reviewed the patient's chart and labs.  Questions were answered to the patient's satisfaction.  Pt. Seen in preop area.  I again explained the high risk nature of the surgery and the higher risk for stomach injury or leaks, and a higher risk for open surgery.  The risks of infection, bleeding, pain, scarring, weight regain, too little or too much weight loss, vitamin deficiencies and need for lifelong vitamin supplementation, hair loss, need for protein supplementation, leaks, stricture, reflux, food intolerance, need for reoperation and conversion to roux Y gastric bypass, need for open surgery, injury to spleen or surrounding structures, DVT's, PE, and death again discussed with the patient and the patient expressed understanding and desires to proceed with laparoscopic vertical sleeve gastrectomy, possible open, intraoperative endoscopy. Will plan for lap takedown of fundoplication and sleeve gastrectomy.    Lodema Pilot DAVID

## 2011-11-12 NOTE — Preoperative (Signed)
Beta Blockers   Reason not to administer Beta Blockers:Not Applicable 

## 2011-11-12 NOTE — Anesthesia Preprocedure Evaluation (Addendum)
Anesthesia Evaluation  Patient identified by MRN, date of birth, ID band Patient awake    Reviewed: Allergy & Precautions, H&P , NPO status , Patient's Chart, lab work & pertinent test results  History of Anesthesia Complications (+) PONV  Airway Mallampati: II TM Distance: >3 FB Neck ROM: Full    Dental No notable dental hx.    Pulmonary neg pulmonary ROS,  breath sounds clear to auscultation  Pulmonary exam normal       Cardiovascular negative cardio ROS  Rhythm:Regular Rate:Normal     Neuro/Psych negative neurological ROS  negative psych ROS   GI/Hepatic negative GI ROS, Neg liver ROS,   Endo/Other  negative endocrine ROSMorbid obesity  Renal/GU negative Renal ROS  negative genitourinary   Musculoskeletal negative musculoskeletal ROS (+)   Abdominal   Peds negative pediatric ROS (+)  Hematology negative hematology ROS (+)   Anesthesia Other Findings Upper front caps   Reproductive/Obstetrics negative OB ROS                          Anesthesia Physical Anesthesia Plan  ASA: II  Anesthesia Plan: General   Post-op Pain Management:    Induction: Intravenous  Airway Management Planned: Oral ETT  Additional Equipment:   Intra-op Plan:   Post-operative Plan: Extubation in OR  Informed Consent: I have reviewed the patients History and Physical, chart, labs and discussed the procedure including the risks, benefits and alternatives for the proposed anesthesia with the patient or authorized representative who has indicated his/her understanding and acceptance.   Dental advisory given  Plan Discussed with: CRNA  Anesthesia Plan Comments:         Anesthesia Quick Evaluation

## 2011-11-12 NOTE — Transfer of Care (Signed)
Immediate Anesthesia Transfer of Care Note  Patient: Natalie Dodson  Procedure(s) Performed: Procedure(s) (LRB): LAPAROSCOPIC GASTRIC SLEEVE RESECTION (N/A) UPPER GI ENDOSCOPY ()  Patient Location: PACU  Anesthesia Type: General  Level of Consciousness: sedated, patient cooperative and responds to stimulaton  Airway & Oxygen Therapy: Patient Spontanous Breathing and Patient connected to face mask oxgen  Post-op Assessment: Report given to PACU RN and Post -op Vital signs reviewed and stable  Post vital signs: Reviewed and stable  Complications: No apparent anesthesia complications

## 2011-11-13 ENCOUNTER — Inpatient Hospital Stay (HOSPITAL_COMMUNITY): Payer: BC Managed Care – PPO

## 2011-11-13 LAB — CBC WITH DIFFERENTIAL/PLATELET
Basophils Relative: 0 % (ref 0–1)
Eosinophils Absolute: 0 10*3/uL (ref 0.0–0.7)
Eosinophils Relative: 0 % (ref 0–5)
Hemoglobin: 12.3 g/dL (ref 12.0–15.0)
Lymphs Abs: 1.4 10*3/uL (ref 0.7–4.0)
MCH: 29.8 pg (ref 26.0–34.0)
MCHC: 34.5 g/dL (ref 30.0–36.0)
MCV: 86.4 fL (ref 78.0–100.0)
Monocytes Absolute: 0.6 10*3/uL (ref 0.1–1.0)
Monocytes Relative: 5 % (ref 3–12)
Neutrophils Relative %: 82 % — ABNORMAL HIGH (ref 43–77)
RBC: 4.13 MIL/uL (ref 3.87–5.11)

## 2011-11-13 LAB — COMPREHENSIVE METABOLIC PANEL
Albumin: 3.3 g/dL — ABNORMAL LOW (ref 3.5–5.2)
Alkaline Phosphatase: 73 U/L (ref 39–117)
BUN: 10 mg/dL (ref 6–23)
Calcium: 9.2 mg/dL (ref 8.4–10.5)
Creatinine, Ser: 0.75 mg/dL (ref 0.50–1.10)
GFR calc Af Amer: >90 mL/min (ref >90)
Glucose, Bld: 152 mg/dL — ABNORMAL HIGH (ref 70–99)
Total Protein: 6.2 g/dL (ref 6.0–8.3)

## 2011-11-13 MED ORDER — INFLUENZA VIRUS VACC SPLIT PF IM SUSP
0.5000 mL | INTRAMUSCULAR | Status: AC
  Administered 2011-11-14: 0.5 mL via INTRAMUSCULAR
  Filled 2011-11-13: qty 0.5

## 2011-11-13 MED ORDER — IOHEXOL 300 MG/ML  SOLN: 30.0000 mL | Freq: Once | INTRAMUSCULAR | Status: AC | PRN

## 2011-11-13 NOTE — Progress Notes (Signed)
Patient ID: Natalie Dodson, female   DOB: Jul 24, 1950, 61 y.o.   MRN: [id number] She continues to look and feel well.  Tolerating liquids.  HR normal.  JP and abdomen appropriate.  UGI without leak.  Will continue liquids and increase as tolerated.  Hopefully home tomorrow with drain in place.

## 2011-11-13 NOTE — Progress Notes (Signed)
1 Day Post-Op  Subjective: Episode of nausea last night but now resolved.  No complaints otherwise  Objective: Vital signs in last 24 hours: Temp:  [97.3 F (36.3 C)-98.7 F (37.1 C)] 98.7 F (37.1 C) (09/11 0602) Pulse Rate:  [63-82] 63  (09/11 0602) Resp:  [9-22] 18  (09/11 0602) BP: (112-160)/(61-79) 114/71 mmHg (09/11 0602) SpO2:  [96 %-100 %] 100 % (09/11 0602) Weight:  [210 lb 6 oz (95.425 kg)] 210 lb 6 oz (95.425 kg) (09/10 1230) Last BM Date: 11/11/11  Intake/Output from previous day: 09/10 0701 - 09/11 0700 In: 3045.8 [I.V.:3045.8] Out: 2915 [Urine:2690; Drains:200; Blood:25] Intake/Output this shift:    General appearance: alert, cooperative and no distress Resp: clear to auscultation bilaterally Cardio: regular rate and rhythm, S1, S2 normal, no murmur, click, rub or gallop GI: soft, minimal tenderness, ND, wounds without infection, jp ss output Extremities: SCd's bilat.  Lab Results:   [facility] 11/13/11 0415  WBC 11.6*  HGB 12.3  HCT 35.7*  PLT 216   BMET  Basename 11/13/11 0415  NA 136  K 4.6  CL 103  CO2 26  GLUCOSE 152*  BUN 10  CREATININE 0.75  CALCIUM 9.2   PT/INR No results found for this basename: LABPROT:2,INR:2 in the last 72 hours ABG No results found for this basename: PHART:2,PCO2:2,PO2:2,HCO3:2 in the last 72 hours  Studies/Results: No results found.  Anti-infectives: Anti-infectives     Start     Dose/Rate Route Frequency Ordered Stop   11/12/11 0620   ertapenem [facility]) 1 g in sodium chloride 0.9 % 50 mL IVPB        1 g 100 mL/hr over 30 Minutes Intravenous 60 min pre-op 11/12/11 0620 11/12/11 0733          Assessment/Plan: s/p Procedure(s) (LRB) with comments: LAPAROSCOPIC GASTRIC SLEEVE RESECTION (N/A) UPPER GI ENDOSCOPY () seems to be doing well to this point.  HR normal.  jp appropriate.  will check UGI and advance diet to clears if okay.  LOS: 1 day    Lodema Pilot DAVID 11/13/2011

## 2011-11-13 NOTE — Progress Notes (Signed)
UGI results called to Dr. Biagio Quint; orders received to begin water then if tolerates well can advanced to bariatric clear liquids; pt aware and Auriel, RN notified. Talmadge Chad, RN Bariatric Nurse Coordinator

## 2011-11-13 NOTE — Progress Notes (Signed)
Pt alert and oriented; VSS; remains NPO; awaiting UGI;denies any nausea or vomiting this am;  denies burping or flatus; denies BM; voiding without difficulty; ambulating without difficulty; c/o some pain at JP site relieved with prn meds; using incentive spirometer; pt already has follow up appts with [facility] and [facility]; discharge instructions given for pt to review. GASTRIC BYPASS/SLEEVE DISCHARGE INSTRUCTIONS  Drs. Fredrik Rigger, Hoxworth, Wilson, and [place] Call if you have any problems.   Call [phone] and ask for the surgeon on call.    If you need immediate assistance come to the ER at [facility]. Tell the ER personnel that you are a new post-op gastric bypass patient. Signs and symptoms to report:   Severe vomiting or nausea. If you cannot tolerate clear liquids for longer than 1 day, you need to call your surgeon.    Abdominal pain which does not get better after taking your pain medication   Fever greater than 101 F degree   Difficulty breathing   Chest pain    Redness, swelling, drainage, or foul odor at incision sites    If your incisions open or pull apart   Swelling or pain in calf (lower leg)   Diarrhea, frequent watery, uncontrolled bowel movements.   Constipation, (no bowel movements for 3 days) if this occurs, Take Milk of Magnesia, 2 tablespoons by mouth, 3 times a day for 2 days if needed.  Call your doctor if constipation continues. Stop taking Milk of Magnesia once you have had a bowel movement. You may also use Miralax according to the label instructions.   Anything you consider "abnormal for you".   Normal side effects after Surgery:   Unable to sleep at night or concentrate   Irritability   Being tearful (crying) or depressed   These are common complaints, possibly related to your anesthesia, stress of surgery and change in lifestyle, that usually go away a few weeks after surgery.  If these feelings continue, call your medical doctor.  Wound Care You may have  surgical glue, steri-strips, or staples over your incisions after surgery.  Surgical glue:  Looks like a clear film over your incisions and will wear off gradually. Steri-strips: Strips of tape over your incisions. You may notice a yellowish color on the skin underneath the steri-strips. This is a substance used to make the steri-strips stick better. Do not pull the steri-strips off - let them fall off.  Staples: Cherlynn Polo may be removed before you leave the hospital. If you go home with staples, call [facility] [phone]) for an appointment with your surgeon's nurse to have staples removed in 7 - 10 days. Showering: You may shower two days after your surgery unless otherwise instructed by your surgeon. Wash gently around wounds with warm soapy water, rinse well, and gently pat dry.  If you have a drain, you may need someone to hold this while you shower. Avoid tub baths until staples are removed and incisions are healed.    Medications   Medications should be liquid or crushed if larger than the size of a dime.  Extended release pills should not be crushed.   Depending on the size and number of medications you take, you may need to stagger/change the time you take your medications so that you do not over-fill your pouch.    Make sure you follow-up with your primary care physician to make medication adjustments needed during rapid weight loss and life-style adjustment.   If you are diabetic, follow  up with the doctor that prescribes your diabetes medication(s) within one week after surgery and check your blood sugar regularly.   Do not drive while taking narcotics!   Do not take acetaminophen (Tylenol) and Roxicet or Lortab Elixir at the same time since these pain medications contain acetaminophen.  Diet at home: (First 2 Weeks) You will see the nutritionist two weeks after your surgery. She will advance your diet if you are tolerating liquids well. Once at home, if you have severe  vomiting or nausea and cannot tolerate clear liquids lasting longer than 1 day, call your surgeon.  Begin high protein shake 2 ounces every 3 hours, 5 - 6 times per day.  Gradually increase the amount you drink as tolerated.  You may find it easier to slowly sip shakes throughout the day.  It is important to get your proteins in first.   Protein Shake   Drink at least 2 ounces of shake 5-6 times per day   Each serving of protein shakes should have a minimum of 15 grams of protein and no more than 5 grams of carbohydrate    Increase the amount of protein shake you drink as tolerated   Protein powder may be added to fluids such as non-fat milk or Lactaid milk (limit to 20 grams added protein powder per serving   The initial goal is to drink at least 8 ounces of protein shake/drink per day (or as directed by the nutritionist). Some examples of protein shakes are [employer], [employer], EAS Edge HP, and [employer]jury. Hydration   Gradually increase the amount of water and other liquids as tolerated (See Acceptable Fluids)   Gradually increase the amount of protein shake as tolerated     Sip fluids slowly and throughout the day   May use Sugar substitutes, use sparingly (limit to 6 - 8 packets per day). Your fluid goal is 64 ounces of fluid daily. It may take a few weeks to build up to this.         32 oz (or more) should be clear liquids and 32 oz (or more) should be full liquids.         Liquids should not contain sugar, caffeine, or carbonation! Acceptable Fluids Clear Liquids:   Water or Sugar-free flavored water, Fruit H2O   Decaffeinated coffee or tea (sugar-free)   Crystal Lite, Wyler's Lite, Minute Maid Lite   Sugar-free Jell-O   Bouillon or broth   Sugar-free Popsicle:   *Less than 20 calories each; Limit 1 per day   Full Liquids:              Protein Shakes/Drinks + 2 choices per day of other full liquids shown below.    Other full liquids must be: No more than 12 grams of Carbs  per serving,  No more than 3 grams of Fat per serving   Strained low-fat cream soup   Non-Fat milk   Fat-free Lactaid Milk   Sugar-free yogurt (Dannon Lite & Fit) Vitamins and Minerals (Start 1 day after surgery unless otherwise directed)   2 Chewable Multivitamin / Multimineral Supplement (i.e. Centrum for Adults)   Chewable Calcium Citrate with Vitamin D-3. Take 1500 mg each day.           (Example: 3 Chewable Calcium Plus 600 with Vitamin D-3 can be found at [facility])         Vitamin B-12, 350 - 500 micrograms (oral tablet) each day   Do not mix  multivitamins containing iron with calcium supplements; take 2 hours   apart   Do not substitute Tums (calcium carbonate) for your calcium   Menstruating women and those at risk for anemia may need extra iron. Talk with your doctor to see if you need additional iron.    If you need extra iron:  Total daily Iron recommendations (including Vitamins) = 50 - 100 mg Iron/day Do not stop taking or change any vitamins or minerals until you talk to your nutritionist or surgeon. Your nutritionist and / or physician must approve all vitamin and mineral supplements. Exercise For maximum success, begin exercising as soon as your doctor recommends. Make sure your physician approves any physical activity.   Depending on fitness level, begin with a simple walking program   Walk 5-15 minutes each day, 7 days per week.    Slowly increase until you are walking 30-45 minutes per day   Consider joining our BELT program. [phone] or email belt@[employer] .edu Things to remember:    You may have sexual relations when you feel comfortable. It is VERY important for female patients to use a reliable birth control method. Fertility often increases after surgery. Do not get pregnant for at least 18 months.   It is very important to keep all follow up appointments with your surgeon, nutritionist, primary care physician, and behavioral health practitioner. After the first year,  please follow up with your bariatric surgeon at least once a year in order to maintain best weight loss results.  [facility]: [phone] [facility]: [phone]   Free counseling is available for you and your family through collaboration between [facility] and [place]. Please call [phone] and leave a message.    Consider purchasing a medical alert bracelet that says you had gastric bypass surgery.    The [facility] has a free Bariatric Surgery Support Group that meets monthly, the 3rd Thursday, 6 pm, Classroom #1, [employer]. You may register online at [email or web address], but registration is not necessary. Select Classes and Support Groups, Bariatric Surgery, or Call [phone]   Do not return to work or drive until cleared by your surgeon   Use your CPAP when sleeping if applicable   Do not lift anything greater than ten pounds for at least two weeks  Talmadge Chad, RN Bariatric Nurse Coordinator

## 2011-11-13 NOTE — Op Note (Signed)
Natalie Dodson, Natalie Dodson              ACCOUNT NO.:  [id number]  MEDICAL RECORD NO.:  [id number]  LOCATION:  1533                         FACILITY:  [facility]  PHYSICIAN:  Sandria Bales. Ezzard Standing, M.D.  DATE OF BIRTH:  1950-12-18  DATE OF PROCEDURE:  11/12/2011                              OPERATIVE REPORT  PREOPERATIVE DIAGNOSIS:  Status post sleeve gastric resection.  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  PROCEDURE:  Esophagogastroscopy.  SURGEON:  Sandria Bales. Ezzard Standing, MD  FIRST ASSISTANT:  No first assistant.  ANESTHESIA:  General endotracheal.  ESTIMATED BLOOD LOSS:  None.  INDICATION FOR PROCEDURE:  Ms. [name] is a 61 year old white female, who sees Dr. Simone Curia as her primary medical doctor.  She had a lap band placed on December 12, 2008.  She did well until she developed an erosion of her lap band and had this removed on January 02, 2010.  She decided to proceed with a laparoscopic sleeve gastrectomy performed by Dr. Lodema Pilot.  I am doing upper endoscopy to evaluate the gastric pouch for both bleeding leak and mucosal viability.  PROCEDURE NOTE:  The patient was placed in a supine position, Dr. Biagio Quint is manning the laparoscope to evaluate the stomach pouch from the outside, while I am doing the upper endoscopy.  I passed the scope down the esophagus into the gastric pouch without difficulty.  Her esophagogastric junction was brought out at about 40 cm.    I then advanced the scope down to the pylorus.  I did not go through the pylorus.  The staple line started about 5-6 cm proximal to pylorus.  The staple line was intact and there was no bleeding.  At the incisura, there was no narrowing.  Immediately beyond the gastroesophageal junction, there was a small pouch (dog ear)inthe staple line, but the mucosa was intact.  During this entire period, Dr. Biagio Quint occluded the stomach at the pylorus.  There was no evidence of air  leak or bubbling.  I then withdrew the scope without difficulty.  Dr. Biagio Quint will dictate the remainder of the operation.   Sandria Bales. Ezzard Standing, M.D., FACS   DHN/MEDQ  D:  11/12/2011  T:  11/13/2011  Job:  [id number]  cc:   Lodema Pilot, MD [address], [address] [place], [place] [address]

## 2011-11-13 NOTE — Care Management Note (Signed)
    Page 1 of 1   11/14/2011     11:43:35 AM   CARE MANAGEMENT NOTE 11/14/2011  Patient:  Natalie Dodson, Natalie Dodson   Account Number:  [id number]  Date Initiated:  11/13/2011  Documentation initiated by:  Lorenda Ishihara  Subjective/Objective Assessment:   61 yo female admitted with lap gastrectomy sleeve and revision of lap band. PTA lived at home with spouse.     Action/Plan:   Anticipated DC Date:  11/15/2011   Anticipated DC Plan:  HOME/SELF CARE      DC Planning Services  CM consult      Choice offered to / List presented to:             Status of service:  Completed, signed off Medicare Important Message given?   (If response is "NO", the following Medicare IM given date fields will be blank) Date Medicare IM given:   Date Additional Medicare IM given:    Discharge Disposition:  HOME/SELF CARE  Per UR Regulation:  Reviewed for med. necessity/level of care/duration of stay  If discussed at Long Length of Stay Meetings, dates discussed:    Comments:

## 2011-11-13 NOTE — Op Note (Signed)
NAMEDARCEL, ZICK              ACCOUNT NO.:  [id number]  MEDICAL RECORD NO.:  [id number]  LOCATION:  1533                         FACILITY:  [facility]  PHYSICIAN:  Lodema Pilot, MD       DATE OF BIRTH:  1950-07-03  DATE OF PROCEDURE:  11/12/2011 DATE OF DISCHARGE:                              OPERATIVE REPORT   PROCEDURE:  Laparoscopic revision of prior lap bands and conversion to vertical sleeve gastrectomy with intraoperative esophagogastroduodenoscopy.  PREOPERATIVE DIAGNOSIS:  Morbid obesity.  POSTOPERATIVE DIAGNOSIS:  Morbid obesity.  SURGEON:  Lodema Pilot, MD  ASSISTANT:  Dr. Ezzard Standing.  ANESTHESIA:  General endotracheal anesthesia with 40 mL of 1% lidocaine with epinephrine and 0.25% Marcaine in a 50:50 mixture.  FLUIDS:  2600 mL of crystalloid.  ESTIMATED BLOOD LOSS:  25 mL.  DRAINS:  A 19-French Blake drain placed along the gastric staple line.  COMPLICATIONS:  None apparent.  FINDINGS:  Takedown of adhesions and prior gastric fundoplication with sleeve gastrectomy formation using a 36-French bougie intraoperative leak test and endoscopy, negative for air leakage.  OPERATIVE DETAILS:  Ms. [name] is 61 year old female who had prior lap band placed by Dr. Ezzard Standing in 2010.  She did well with the weight loss, but subsequently developed band erosion and her band was removed and omental patch was performed, and she regained the weight and now desires revision to a vertical sleeve gastrectomy.  OPERATIVE DETAILS:  Ms. [name] was seen and evaluated in the preoperative area.  Risks and benefits of procedure were again discussed in lay terms.  Informed consent was obtained.  I again discussed with the patient the high risk nature and residual bariatric surgery, and the much higher risk of leakage postoperatively.  She expressed understanding and desired to proceed with conversion to sleeve gastrectomy.  She was given prophylactic antibiotics and subcu heparin. She was  taken to the operating room, placed on table in supine position. General endotracheal anesthesia was obtained and Foley catheter was placed.  Her abdomen was prepped and draped in a standard surgical fashion and then through left rectus muscle an incision was cutdown through the subcutaneous tissue using blunt dissection.  The anterior fascia was elevated and sharply incised.  The muscle fibers were split bluntly, and the posterior fascia was sharply incised.  The peritoneum entered bluntly.  A 12 mm balloon port was placed in the peritoneum and pneumoperitoneum was obtained.  Laparoscope was introduced.  There was no evidence of bowel injury upon entry.  The two 5 mm left upper quadrant ports were placed, 15-mm right rectus port was placed and a 5- mm right upper quadrant port was placed under direct visualization.  We did not need to place a liver retractor, as the liver was adhered to the abdominal wall.  Her stomach near the incisura was adhered to the liver and this was taken down, mostly with sharp dissection.  But some scalpel was used to take down the stomach and the adhesions from the liver freeing up the lesser curvature.  She did not have any much adhesions along the greater curvature.  Up near the GE junction, in the area of the prior fundoplication and she did have  some omentum which was adhered to the stomach and esophagus but this was actually fairly easily taken down mostly with blunt dissection and some Harmonic scalpel was used as well, and care was taken to avoid injury to the esophagus.  She had some the stomach which was rolled up onto the stomach near the GE junction, and this appeared to be area of fundoplication, but I did not see the prior sutures and or the titanium diagnostic and clips I continued to dissect this area and the fundoplication was taken down and restored what appeared to be pretty much normal looking anatomy and it appeared that there was a small  fold vertically along the stomach consistent with stomach to stomach scar tissue.  This was due to having the fundoplication, although this did not appear to involve the area of anticipated staple line.  After we felt that we had restored pretty much normally looking anatomy and passed a 40-French lighted bougie in the esophagus and the stomach and the GE junction was confirmed and again we did not appear to have any undone fundoplication or anything that would affect her staple lines.  It measured around 5-6 cm from the pylorus and started mobilization of the short gastric vessels.  This was carried up along the greater curvature of the stomach towards the spleen, and the stomach was mobilized from the left crus using Harmonic scalpel.  The posterior gastric attachments were divided as well using Harmonic scalpel, we mobilized the posterior stomach until we can see the lesser curve vessels.  We had adequate mobilization and performed sleeve gastrectomy and the 60 mm firings of the black tri staples Covidien stapler were used to perform sleeve gastrectomy.  Staple firing was taken towards the angle of His taking care to avoid transections navicular narrowing of the stomach at the angle of His and a second application of the stapler was applied to the patient's right of the previous staple lines, taking care to avoid the narrowing of the stomach as well and to avoid Christmas tree formation of the staple lines.  We attempted pass the 40-French live bougie once again that the blunt bougie would not pass.  So we passed a 36-French taper bougie which was easier to pass along the lesser curvature, and into the pylorus and the duodenum without un-clamping the staples.  Took a second firing of black loads and Tri-Stapler load and subsequent firings were taken using a black Tri Staple 60 mm staple load up towards the angle of His and because of that, I was using a 36-French bougie.  I stayed away  from the bougie taking care not to hug the stapler along the bougie.  Subsequent firings were taken in each staple line was inspected and there was no evidence of any dimpling of the stomach and or that we had come across the  fundoplication.  As we approached the angle of His, we completed the transection of the stomach taking care to avoid injury to the esophagus.  The stomach was completely transected.  Few points of bleed was stable and were clipped with hemoclips until the staple lines were noted to be hemostatic.  There was a small dog ear formation at the angle of His, which is typical as I had intentionally tried to avoid Cutting the stent right at the esophagus and to minimize leakage.  Again the staple staples appeared well formed and the 2 clips were placed along the staple line for hemostasis and the abdomen was irrigated  with sterile saline solution.  The staple line appeared to be hemostatic. Dr. Ezzard Standing at this point, perform intraoperative EGD had evaluated the internal lumen of the stomach and using the passing scope without difficulty into the duodenum and was no evidence of stricturing or internal bleeding.  The patient was stable and there was no evidence of staple line leakage doubled at December 22.  The fluid was suctioned in the abdomen and the resected greater curvature of the stomach was pulled up through the enlarged right rectus incision and sent to Pathology for permanent section.  Prior to sending this to Pathology.  I examined the resected portion of the stomach as well for any areas that might have been folded up and the staple line, and for possible fundoplication which was not taken down.  The staple line appeared clean without any evidence of duplicated folds of the stomach.  The specimen was sent to Pathology for permanent sectioning.  The port was placed back in the skin and dissection the abdomen was inspected for hemostasis, which was noted to be  adequate.  Tisseel fibrin glue was applied along the staple lines, and then 19-French Blake drain was passed into the abdomen and exited through the left upper quadrant trocar site and this showed a left posterior to the staple line.  The omentum was placed back over the stomach and stable line to drain and the drain was sutured in place with a drain stitch.  The 12 mm left rectus scored a 15 mm closed right rectus port were approximated laparoscopically were close endoscopically and using 0 Vicryl suture and Endo Close device.  The fascia appeared well approximated the remainder of the trocars were removed, and the abdomen was noted to be hemostatic, and the skin edges were injected with 40 mL of 1% lidocaine with epinephrine and 0.25% Marcaine in a 50:50 mixture.  The skin edges were approximated with 4-0 Monocryl subcuticular suture.  Skin was washed and dried, and Dermabond was applied.  All sponge, needle, and instrument counts correct at the end of the.  The patient tolerated procedure well without apparent complications.          ______________________________ Lodema Pilot, MD     BL/MEDQ  D:  11/12/2011  T:  11/13/2011  Job:  [id number]

## 2011-11-14 LAB — CBC WITH DIFFERENTIAL/PLATELET
Basophils Absolute: 0 10*3/uL (ref 0.0–0.1)
HCT: 32.4 % — ABNORMAL LOW (ref 36.0–46.0)
Lymphocytes Relative: 38 % (ref 12–46)
Lymphs Abs: 3.9 10*3/uL (ref 0.7–4.0)
MCV: 87.3 fL (ref 78.0–100.0)
Monocytes Absolute: 0.6 10*3/uL (ref 0.1–1.0)
Neutro Abs: 5.6 10*3/uL (ref 1.7–7.7)
RBC: 3.71 MIL/uL — ABNORMAL LOW (ref 3.87–5.11)
RDW: 13.5 % (ref 11.5–15.5)
WBC: 10.2 10*3/uL (ref 4.0–10.5)

## 2011-11-14 MED ORDER — OXYCODONE-ACETAMINOPHEN 5-325 MG/5ML PO SOLN: 5.0000 mL | ORAL | Status: DC | PRN

## 2011-11-14 MED ORDER — ONDANSETRON 4 MG PO TBDP: 4.0000 mg | ORAL_TABLET | Freq: Three times a day (TID) | ORAL | Status: AC | PRN

## 2011-11-14 NOTE — Progress Notes (Signed)
2 Days Post-Op  Subjective: Still doing well. No complaints.  Has not taken any pain medication  Objective: Vital signs in last 24 hours: Temp:  [97.6 F (36.4 C)-99.3 F (37.4 C)] 98.7 F (37.1 C) (09/12 0200) Pulse Rate:  [54-94] 57  (09/12 0200) Resp:  [18] 18  (09/12 0200) BP: (123-141)/(62-84) 141/62 mmHg (09/12 0200) SpO2:  [93 %-100 %] 99 % (09/12 0200) Last BM Date: 11/11/11  Intake/Output from previous day: 09/11 0701 - 09/12 0700 In: 3847.8 [I.V.:3847.8] Out: 1980 [Urine:1900; Drains:80] Intake/Output this shift: Total I/O In: 3847.8 [I.V.:3847.8] Out: 470 [Urine:450; Drains:20]  General appearance: alert, cooperative and no distress Resp: clear to auscultation bilaterally Cardio: regular rate and rhythm, S1, S2 normal, no murmur, click, rub or gallop GI: soft, minimal incisionl tenderness, ND, JP ss and thin  Lab Results:   Basename 11/14/11 0435 11/13/11 0415  WBC 10.2 11.6*  HGB 10.9* 12.3  HCT 32.4* 35.7*  PLT 172 216   BMET  Basename 11/13/11 0415  NA 136  K 4.6  CL 103  CO2 26  GLUCOSE 152*  BUN 10  CREATININE 0.75  CALCIUM 9.2   PT/INR No results found for this basename: LABPROT:2,INR:2 in the last 72 hours ABG No results found for this basename: PHART:2,PCO2:2,PO2:2,HCO3:2 in the last 72 hours  Studies/Results: Dg Ugi W/water Sol Cm  11/13/2011  *RADIOLOGY REPORT*  Clinical Data: day 1 status post sleeve gastrectomy  ESOPHAGUS/BARIUM SWALLOW/TABLET STUDY  Technique: Limited upper GI study using 30 ml of Omnipaque-300  Comparison:  None.  Findings: Preliminary scout view of the abdomen shows postsurgical changes in the left upper quadrant.  There is nonspecific nonobstructive bowel gas pattern.  Small sips of contrast material are given to the patient.  Contrast material  in  seen passing easily from the esophagus  in small gastric remnant.  No evidence of contrast extravasation.  No retention of contrast material in distal esophagus.  From  the gastric remnant contrast is seen entering the proximal small bowel which is unremarkable.  IMPRESSION: Status post sleeves gastrectomy with easy passage of contrast material from esophagus in the small gastric remnant.  No evidence of retention of contrast material in the esophagus.  No contrast extravasation.  Contrast material is noted passing from the gastric remnant in proximal small bowel.  Fluoroscopy time was 1.1 minutes.   Original Report Authenticated By: Natasha Mead, M.D.     Anti-infectives: Anti-infectives     Start     Dose/Rate Route Frequency Ordered Stop   11/12/11 0620   ertapenem (INVANZ) 1 g in sodium chloride 0.9 % 50 mL IVPB        1 g 100 mL/hr over 30 Minutes Intravenous 60 min pre-op 11/12/11 0620 11/12/11 0733          Assessment/Plan: s/p Procedure(s) (LRB) with comments: LAPAROSCOPIC GASTRIC SLEEVE RESECTION (N/A) UPPER GI ENDOSCOPY () she looks and feels well.  should be okay for discharge today with drain in place.  LOS: 2 days    Lodema Pilot DAVID 11/14/2011

## 2011-11-14 NOTE — Discharge Summary (Signed)
  Physician Discharge Summary  Patient ID: Natalie Dodson MRN: [id number] DOB/AGE: May 05, 1950 61 y.o.  Admit date: 11/12/2011 Discharge date: 11/14/2011  Admission Diagnoses: morbid obesity  Discharge Diagnoses: same Active Problems:  * No active hospital problems. *    Discharged Condition: good  Hospital Course: to OR for lap vertical sleeve gastrectomy 11/12/11.  Has done well postop. On POD 1 had negative UGI and diet advanced.  She had minimal discomfort and tolerating diet and stable for discharge on POD 2  Consults: None  Significant Diagnostic Studies: UGI 11/13/11   Treatments: surgery: lap vertical sleeve gastrectomy with EGD on 11/12/11  Disposition: 01-Home or Self Care  Discharge Orders    Future Appointments: Provider: Department: Dept Phone: Center:   11/26/2011 4:00 PM [facility] [phone] [facility]   11/28/2011 9:00 AM Lodema Pilot, DO [facility] [phone] None     Future Orders Please Complete By Expires   Increase activity slowly      Discharge instructions      Comments:   Call [phone] appointment for drain removal on Wed or Thurs.  May start full liquids tomorrow x1 week, then pureed diet x1 week, then soft diet x1 week, then gradually increase to high protein, low fat and low carb diet. Take liquid or crushed medicines x 1 month. Return to ER if increased abdominal pain, fevers, or chills or nausea or vomiting. Record and empty drain twice daily   Call MD for:  temperature >100.4      Call MD for:  persistant nausea and vomiting      Call MD for:  severe uncontrolled pain      Call MD for:  redness, tenderness, or signs of infection (pain, swelling, redness, odor or green/yellow discharge around incision site)      Call MD for:  difficulty breathing, headache or visual disturbances      Call MD for:  persistant dizziness or light-headedness          Medication List     As of 11/14/2011  7:01 AM    STOP  taking these medications         naproxen sodium 220 MG tablet   Commonly known as: ANAPROX      TAKE these medications         BIOGAIA PROBIOTIC Liqd   Take by mouth daily at 8 pm.      ondansetron 4 MG disintegrating tablet   Commonly known as: ZOFRAN-ODT   Take 1 tablet (4 mg total) by mouth every 8 (eight) hours as needed for nausea.      oxyCODONE-acetaminophen 5-325 MG/5ML solution   Commonly known as: ROXICET   Take 5-10 mLs by mouth every 4 (four) hours as needed.         SignedLodema Pilot DAVID 11/14/2011, 7:01 AM

## 2011-11-14 NOTE — Progress Notes (Signed)
Patient discharged ambulatory. IV removed by [employer] [facility]. Pt. Demonstrates jp drain care. Bariatric RN Olegario Messier in to speak with patient. rx given for roxicet. Assessment unchanged

## 2011-11-14 NOTE — Progress Notes (Signed)
Pt alert and oriented; VSS; husband at bedside; tolerating water and protein shakes well; denies nausea or vomiting; +flatus; +BM; voiding without difficulty; ambulating without difficulty; c/o some abdominal soreness at JP drain site; to call [facility] to make appt for drain removal on Wednesday; using incentive spirometer; pt already has follow up appts with [facility] and [facility] for 2 weeks post op- aware of support group; discharge instructions reviewed and pt and husband verbalized understanding of.  GASTRIC BYPASS/SLEEVE DISCHARGE INSTRUCTIONS  Drs. Fredrik Rigger, Hoxworth, Wilson, and [place] Call if you have any problems.   Call [phone] and ask for the surgeon on call.    If you need immediate assistance come to the ER at [facility]. Tell the ER personnel that you are a new post-op gastric bypass patient. Signs and symptoms to report:   Severe vomiting or nausea. If you cannot tolerate clear liquids for longer than 1 day, you need to call your surgeon.    Abdominal pain which does not get better after taking your pain medication   Fever greater than 101 F degree   Difficulty breathing   Chest pain    Redness, swelling, drainage, or foul odor at incision sites    If your incisions open or pull apart   Swelling or pain in calf (lower leg)   Diarrhea, frequent watery, uncontrolled bowel movements.   Constipation, (no bowel movements for 3 days) if this occurs, Take Milk of Magnesia, 2 tablespoons by mouth, 3 times a day for 2 days if needed.  Call your doctor if constipation continues. Stop taking Milk of Magnesia once you have had a bowel movement. You may also use Miralax according to the label instructions.   Anything you consider "abnormal for you".   Normal side effects after Surgery:   Unable to sleep at night or concentrate   Irritability   Being tearful (crying) or depressed   These are common complaints, possibly related to your anesthesia, stress of surgery and change in lifestyle,  that usually go away a few weeks after surgery.  If these feelings continue, call your medical doctor.  Wound Care You may have surgical glue, steri-strips, or staples over your incisions after surgery.  Surgical glue:  Looks like a clear film over your incisions and will wear off gradually. Steri-strips: Strips of tape over your incisions. You may notice a yellowish color on the skin underneath the steri-strips. This is a substance used to make the steri-strips stick better. Do not pull the steri-strips off - let them fall off.  Staples: Cherlynn Polo may be removed before you leave the hospital. If you go home with staples, call [facility] [phone]) for an appointment with your surgeon's nurse to have staples removed in 7 - 10 days. Showering: You may shower two days after your surgery unless otherwise instructed by your surgeon. Wash gently around wounds with warm soapy water, rinse well, and gently pat dry.  If you have a drain, you may need someone to hold this while you shower. Avoid tub baths until staples are removed and incisions are healed.    Medications   Medications should be liquid or crushed if larger than the size of a dime.  Extended release pills should not be crushed.   Depending on the size and number of medications you take, you may need to stagger/change the time you take your medications so that you do not over-fill your pouch.    Make sure you follow-up with your primary care  physician to make medication adjustments needed during rapid weight loss and life-style adjustment.   If you are diabetic, follow up with the doctor that prescribes your diabetes medication(s) within one week after surgery and check your blood sugar regularly.   Do not drive while taking narcotics!   Do not take acetaminophen (Tylenol) and Roxicet or Lortab Elixir at the same time since these pain medications contain acetaminophen.  Diet at home: (First 2 Weeks) You will see the  nutritionist two weeks after your surgery. She will advance your diet if you are tolerating liquids well. Once at home, if you have severe vomiting or nausea and cannot tolerate clear liquids lasting longer than 1 day, call your surgeon.  Begin high protein shake 2 ounces every 3 hours, 5 - 6 times per day.  Gradually increase the amount you drink as tolerated.  You may find it easier to slowly sip shakes throughout the day.  It is important to get your proteins in first.   Protein Shake   Drink at least 2 ounces of shake 5-6 times per day   Each serving of protein shakes should have a minimum of 15 grams of protein and no more than 5 grams of carbohydrate    Increase the amount of protein shake you drink as tolerated   Protein powder may be added to fluids such as non-fat milk or Lactaid milk (limit to 20 grams added protein powder per serving   The initial goal is to drink at least 8 ounces of protein shake/drink per day (or as directed by the nutritionist). Some examples of protein shakes are [employer], [employer], EAS Edge HP, and [employer]jury. Hydration   Gradually increase the amount of water and other liquids as tolerated (See Acceptable Fluids)   Gradually increase the amount of protein shake as tolerated     Sip fluids slowly and throughout the day   May use Sugar substitutes, use sparingly (limit to 6 - 8 packets per day). Your fluid goal is 64 ounces of fluid daily. It may take a few weeks to build up to this.         32 oz (or more) should be clear liquids and 32 oz (or more) should be full liquids.         Liquids should not contain sugar, caffeine, or carbonation! Acceptable Fluids Clear Liquids:   Water or Sugar-free flavored water, Fruit H2O   Decaffeinated coffee or tea (sugar-free)   Crystal Lite, Wyler's Lite, Minute Maid Lite   Sugar-free Jell-O   Bouillon or broth   Sugar-free Popsicle:   *Less than 20 calories each; Limit 1 per day   Full Liquids:               Protein Shakes/Drinks + 2 choices per day of other full liquids shown below.    Other full liquids must be: No more than 12 grams of Carbs per serving,  No more than 3 grams of Fat per serving   Strained low-fat cream soup   Non-Fat milk   Fat-free Lactaid Milk   Sugar-free yogurt (Dannon Lite & Fit) Vitamins and Minerals (Start 1 day after surgery unless otherwise directed)   2 Chewable Multivitamin / Multimineral Supplement (i.e. Centrum for Adults)   Chewable Calcium Citrate with Vitamin D-3. Take 1500 mg each day.           (Example: 3 Chewable Calcium Plus 600 with Vitamin D-3 can be found at [facility])  Vitamin B-12, 350 - 500 micrograms (oral tablet) each day   Do not mix multivitamins containing iron with calcium supplements; take 2 hours   apart   Do not substitute Tums (calcium carbonate) for your calcium   Menstruating women and those at risk for anemia may need extra iron. Talk with your doctor to see if you need additional iron.    If you need extra iron:  Total daily Iron recommendations (including Vitamins) = 50 - 100 mg Iron/day Do not stop taking or change any vitamins or minerals until you talk to your nutritionist or surgeon. Your nutritionist and / or physician must approve all vitamin and mineral supplements. Exercise For maximum success, begin exercising as soon as your doctor recommends. Make sure your physician approves any physical activity.   Depending on fitness level, begin with a simple walking program   Walk 5-15 minutes each day, 7 days per week.    Slowly increase until you are walking 30-45 minutes per day   Consider joining our BELT program. [phone] or email belt@[employer] .edu Things to remember:    You may have sexual relations when you feel comfortable. It is VERY important for female patients to use a reliable birth control method. Fertility often increases after surgery. Do not get pregnant for at least 18 months.   It is very important to keep all  follow up appointments with your surgeon, nutritionist, primary care physician, and behavioral health practitioner. After the first year, please follow up with your bariatric surgeon at least once a year in order to maintain best weight loss results.  [facility]: [phone] [facility]: [phone]   Free counseling is available for you and your family through collaboration between [facility] and [place]. Please call [phone] and leave a message.    Consider purchasing a medical alert bracelet that says you had gastric bypass surgery.    The [facility] has a free Bariatric Surgery Support Group that meets monthly, the 3rd Thursday, 6 pm, Classroom #1, [place]. You may register online at [email or web address], but registration is not necessary. Select Classes and Support Groups, Bariatric Surgery, or Call [phone]   Do not return to work or drive until cleared by your surgeon   Use your CPAP when sleeping if applicable   Do not lift anything greater than ten pounds for at least two weeks  Talmadge Chad, RN Bariatric Nurse Coordinator

## 2011-11-21 ENCOUNTER — Ambulatory Visit (INDEPENDENT_AMBULATORY_CARE_PROVIDER_SITE_OTHER): Payer: BC Managed Care – PPO | Admitting: General Surgery

## 2011-11-21 ENCOUNTER — Encounter (INDEPENDENT_AMBULATORY_CARE_PROVIDER_SITE_OTHER): Payer: Self-pay | Admitting: General Surgery

## 2011-11-21 VITALS — BP 118/82 | HR 72 | Temp 98.4°F | Resp 14 | Ht 63.0 in | Wt 199.8 lb

## 2011-11-21 DIAGNOSIS — Z5189 Encounter for other specified aftercare: Secondary | ICD-10-CM

## 2011-11-21 DIAGNOSIS — Z4889 Encounter for other specified surgical aftercare: Secondary | ICD-10-CM

## 2011-11-21 NOTE — Progress Notes (Signed)
Subjective:     Patient ID: ADIBA FARGNOLI, female   DOB: 08/06/1950, 61 y.o.   MRN: [id number]  HPI This patient follows up about 9 days status post laparoscopic vertical sleeve gastrectomy after LAP-BAND erosion.  She says that she is doing very well and is not taking any pain medication. She has no abdominal pain except for discomfort around the drain site. She denies any fevers or chills or sweats and has no pain with eating. She is on soft diet which she is tolerating well. She has no complaints today.  Review of Systems     Objective:   Physical Exam No acute distress and nontoxic-appearing Her abdomen is soft and nontender on exam and her incisions are healing well without sign of infection. Her JP has serous output and has been less then 20 cc for the last week.    Assessment:     Status post laparoscopic vertical sleeve gastrectomy-doing well She seems to be recovering well from her procedure and there is no evidence of any postoperative complication specifically a week. She has clear fluid coming from her drain and has no abdominal discomfort or fevers or chills and overall feels well. Her drain was removed today in clinic. I recommended that she continue with her postoperative diet and I will see her back in 3 weeks. I recommended that she maintain her hydration and continue with her protein supplementation and vitamins.    Plan:     I will see her back in 3 weeks or sooner as needed.

## 2011-11-26 ENCOUNTER — Encounter: Payer: Self-pay | Admitting: *Deleted

## 2011-11-26 ENCOUNTER — Encounter: Payer: BC Managed Care – PPO | Admitting: *Deleted

## 2011-11-28 ENCOUNTER — Encounter (INDEPENDENT_AMBULATORY_CARE_PROVIDER_SITE_OTHER): Payer: BC Managed Care – PPO | Admitting: General Surgery

## 2011-11-28 ENCOUNTER — Ambulatory Visit (INDEPENDENT_AMBULATORY_CARE_PROVIDER_SITE_OTHER): Payer: BC Managed Care – PPO | Admitting: General Surgery

## 2011-11-28 ENCOUNTER — Encounter (INDEPENDENT_AMBULATORY_CARE_PROVIDER_SITE_OTHER): Payer: Self-pay | Admitting: General Surgery

## 2011-11-28 VITALS — BP 130/84 | HR 68 | Temp 97.6°F | Resp 16 | Ht 63.5 in | Wt 198.6 lb

## 2011-11-28 DIAGNOSIS — Z5189 Encounter for other specified aftercare: Secondary | ICD-10-CM

## 2011-11-28 DIAGNOSIS — Z4889 Encounter for other specified surgical aftercare: Secondary | ICD-10-CM

## 2011-11-28 NOTE — Progress Notes (Signed)
Subjective:     Patient ID: Natalie Dodson, female   DOB: 09-14-1950, 61 y.o.   MRN: [id number]  HPI This patient follows up about 10 days status post revisional laparoscopic vertical sleeve gastrectomy for morbid obesity after a band erosion. Her drain has been removed and she has been doing well. She is tolerating diet and has not had any significant discomfort or fevers or chills. Yesterday she had acute onset of right upper quadrant tenderness which she said felt like someone was putting her fist into her right upper quadrant and this radiated around to her back. This was not associated with eating and she still has no food intolerance or nausea. She has no left-sided discomfort. No fevers  Review of Systems     Objective:   Physical Exam On exam she is in no acute distress and nontoxic-appearing syncopal in a chair Her abdomen is soft and without any significant tenderness on exam today. She is nondistended and certainly has no evidence of any peritoneal signs. In the area of her larger incision in the right rectus region she has some mild tenderness to deep palpation but no evidence of hernia.    Assessment:     Abdominal pain This may be due to gallstones and episode of symptomatic cholelithiasis or a hernia at one of her trocar sites. I do not think that she has any evidence of staple line leak since she looks so good and feels much better today. She really has no significant discomfort today as well. I discussed with her the possibilities for ultrasound versus CT scan evaluation and I think since she is improving we will hold off on imaging and she will call me back if any of her symptoms return. Otherwise I would consider ultrasound to evaluate for cholelithiasis or CT with oral contrast to evaluate for leak or hernia as well.    Plan:     She will call me back if any increase in her symptoms otherwise she will followup in 2 weeks at her regularly scheduled appointment.

## 2011-12-11 ENCOUNTER — Ambulatory Visit (INDEPENDENT_AMBULATORY_CARE_PROVIDER_SITE_OTHER): Payer: BC Managed Care – PPO | Admitting: General Surgery

## 2011-12-11 VITALS — BP 128/80 | HR 56 | Temp 97.6°F | Resp 18 | Ht 63.0 in | Wt 198.6 lb

## 2011-12-11 DIAGNOSIS — Z5189 Encounter for other specified aftercare: Secondary | ICD-10-CM

## 2011-12-11 DIAGNOSIS — Z4889 Encounter for other specified surgical aftercare: Secondary | ICD-10-CM

## 2011-12-11 NOTE — Progress Notes (Signed)
Subjective:     Patient ID: Natalie Dodson, female   DOB: 08/10/1950, 61 y.o.   MRN: [id number]  HPI This patient follows up one month status post revision laparoscopic vertical sleeve gastrectomy she has been doing well since her surgery but complains of left upper quadrant pain in the morning when she wakes. She says that throughout the day of this improves but returns again the following day. She says it feels like something is pulling in the area. She's been having reflux or any food intolerance or regurgitation. She's not had any nausea vomiting and otherwise her abdominal pain has improved except for this left upper quadrant pain in the morning. She has lost 12 pounds since her procedure and says that she is now walking on the treadmill. She is not taking according to the patient she is taking multivitamins  Review of Systems     Objective:   Physical Exam She is in no distress and nontoxic-appearing her abdomen is soft and nontender on exam her incisions are healing well without sign of infection there is no evidence of hernia    Assessment:     Status post vertical sleve gastrectomy Overall I think that she is doing fairly well and there is no evidence of any postoperative complications. I'm not certain as to what could be causing this left upper quadrant pain but again there is no evidence of any postoperative complications. I think that she would show other signs of abscess or leak and she is fairly far out from her procedure for these to manifest now. I would like to have seen her lose a little more weight from her procedure but she says that she is starting to work out now and I think that this will certainly help. I recommended that if she continues to go slow with a weight loss of that she begin to keep any food journal which might help to identify the cause of her slow weight loss.    Plan:     Increase protein supplementation and I'll see her back in one month and at that time we  will check some nutrition labs.

## 2012-01-09 ENCOUNTER — Ambulatory Visit (INDEPENDENT_AMBULATORY_CARE_PROVIDER_SITE_OTHER): Payer: BC Managed Care – PPO | Admitting: General Surgery

## 2012-07-28 ENCOUNTER — Encounter: Payer: Self-pay | Admitting: *Deleted

## 2012-07-30 ENCOUNTER — Encounter: Payer: Self-pay | Admitting: Nurse Practitioner

## 2012-07-30 ENCOUNTER — Telehealth: Payer: Self-pay | Admitting: Family Medicine

## 2012-07-30 ENCOUNTER — Other Ambulatory Visit: Payer: Self-pay | Admitting: *Deleted

## 2012-07-30 ENCOUNTER — Ambulatory Visit (INDEPENDENT_AMBULATORY_CARE_PROVIDER_SITE_OTHER): Payer: BC Managed Care – PPO | Admitting: Nurse Practitioner

## 2012-07-30 ENCOUNTER — Telehealth: Payer: Self-pay

## 2012-07-30 ENCOUNTER — Other Ambulatory Visit: Payer: Self-pay

## 2012-07-30 VITALS — BP 130/78 | HR 70 | Ht 62.25 in | Wt 184.0 lb

## 2012-07-30 DIAGNOSIS — Z1211 Encounter for screening for malignant neoplasm of colon: Secondary | ICD-10-CM

## 2012-07-30 DIAGNOSIS — Z79899 Other long term (current) drug therapy: Secondary | ICD-10-CM

## 2012-07-30 DIAGNOSIS — Z0000 Encounter for general adult medical examination without abnormal findings: Secondary | ICD-10-CM

## 2012-07-30 DIAGNOSIS — Z01419 Encounter for gynecological examination (general) (routine) without abnormal findings: Secondary | ICD-10-CM

## 2012-07-30 DIAGNOSIS — M8580 Other specified disorders of bone density and structure, unspecified site: Secondary | ICD-10-CM

## 2012-07-30 DIAGNOSIS — E785 Hyperlipidemia, unspecified: Secondary | ICD-10-CM

## 2012-07-30 DIAGNOSIS — R7301 Impaired fasting glucose: Secondary | ICD-10-CM

## 2012-07-30 MED ORDER — LORCASERIN HCL 10 MG PO TABS: 10.0000 mg | ORAL_TABLET | Freq: Two times a day (BID) | ORAL | Status: DC

## 2012-07-30 NOTE — Telephone Encounter (Signed)
Patient was seen earlier today and said she was supposed to receive paperwork on a bone density test?

## 2012-07-30 NOTE — Telephone Encounter (Signed)
Bone Density scheduled [facility] August 19, 2012 at 8:45 am. Pt notified of appointment

## 2012-07-31 NOTE — Progress Notes (Signed)
  Subjective:    Patient ID: Natalie Dodson, female    DOB: [birth year], 62 y.o.   MRN: [id number]  HPI presents for her wellness checkup. Same sexual partner. No vaginal discharge. No pelvic pain. No fever. Recently had a sleeve gastrectomy. Is interested in oral medication to help her lose about another 20 pounds. Getting regular exercise. Doing well with her diet. Regular eye exams. Regular dental exams. Has not had her colonoscopy.    Review of Systems  Constitutional: Negative for activity change, appetite change and fatigue.  HENT: Negative for dental problem.   Eyes: Negative for visual disturbance.  Respiratory: Negative for cough, chest tightness and shortness of breath.   Cardiovascular: Negative for chest pain.  Gastrointestinal: Negative for nausea, vomiting, diarrhea, constipation, blood in stool and abdominal distention.  Genitourinary: Negative for dysuria, vaginal discharge, difficulty urinating and pelvic pain.  Psychiatric/Behavioral: Negative for sleep disturbance.       Objective:   Physical Exam  Constitutional: She is oriented to person, place, and time. She appears well-developed. No distress.  HENT:  Right Ear: External ear normal.  Left Ear: External ear normal.  Mouth/Throat: Oropharynx is clear and moist.  Neck: Normal range of motion. Neck supple. No tracheal deviation present. No thyromegaly present.  Cardiovascular: Normal rate, regular rhythm and normal heart sounds.  Exam reveals no gallop.   No murmur heard. Pulmonary/Chest: Effort normal and breath sounds normal.  Abdominal: Soft. She exhibits no distension. There is no tenderness.  Genitourinary: No vaginal discharge found.  Musculoskeletal: She exhibits no edema.  Lymphadenopathy:    She has no cervical adenopathy.  Neurological: She is alert and oriented to person, place, and time.  Skin: Skin is warm and dry. No rash noted.  Psychiatric: She has a normal mood and affect. Her behavior is  normal.   Breast exam: Minimal fine nodularity, no dominant masses. Axilla no adenopathy. External GU normal limit. Rectal exam normal limit, no stool for Hemoccult.        Assessment & Plan:  Other and unspecified hyperlipidemia - Plan: Lipid panel  Encounter for long-term (current) use of other medications - Plan: CBC with Differential, Hepatic function panel, TSH, Vitamin D 25 hydroxy  Impaired fasting glucose - Plan: Basic metabolic panel  Well woman exam  Meds ordered this encounter  Medications  . Biotin 5000 MCG TABS    Sig: Take by mouth daily.  . Lorcaserin HCl (BELVIQ) 10 MG TABS    Sig: Take 10 mg by mouth 2 (two) times daily.    Dispense:  30 tablet    Refill:  0    Order Specific Question:  Supervising Provider    Answer:  Merlyn Albert [[id number]]   Patient to check with insurance to see if they will cover Belviq. Given information on colonoscopy so she can schedule this. Also given prescription for Zostavax with information. Next physical in one year.

## 2012-07-31 NOTE — Assessment & Plan Note (Signed)
Had sleeve gastrectomy. Trial of Belviq 10 mg twice a day if patient can afford this.

## 2012-08-03 ENCOUNTER — Encounter (HOSPITAL_COMMUNITY): Payer: Self-pay | Admitting: Pharmacy Technician

## 2012-08-03 ENCOUNTER — Telehealth: Payer: Self-pay

## 2012-08-03 LAB — LIPID PANEL
HDL: 63 mg/dL (ref >39)
LDL Cholesterol: 143 mg/dL — ABNORMAL HIGH (ref 0–99)
Triglycerides: 122 mg/dL (ref <150)
VLDL: 24 mg/dL (ref 0–40)

## 2012-08-03 LAB — BASIC METABOLIC PANEL
Chloride: 104 mEq/L (ref 96–112)
Potassium: 5.2 mEq/L (ref 3.5–5.3)
Sodium: 141 mEq/L (ref 135–145)

## 2012-08-03 LAB — CBC WITH DIFFERENTIAL/PLATELET
Eosinophils Relative: 2 % (ref 0–5)
HCT: 39.9 % (ref 36.0–46.0)
Hemoglobin: 13.7 g/dL (ref 12.0–15.0)
Lymphocytes Relative: 40 % (ref 12–46)
MCHC: 34.3 g/dL (ref 30.0–36.0)
MCV: 85.1 fL (ref 78.0–100.0)
Monocytes Absolute: 0.5 10*3/uL (ref 0.1–1.0)
Monocytes Relative: 6 % (ref 3–12)
Neutro Abs: 3.6 10*3/uL (ref 1.7–7.7)
WBC: 7 10*3/uL (ref 4.0–10.5)

## 2012-08-03 LAB — HEPATIC FUNCTION PANEL
Albumin: 3.8 g/dL (ref 3.5–5.2)
Total Protein: 6.8 g/dL (ref 6.0–8.3)

## 2012-08-03 NOTE — Telephone Encounter (Signed)
Gastroenterology Pre-Procedure Form  PT HAD A GASTRIC SLEEVING IN October 2013.   Request Date: 07/30/2012    Requesting Physician: Dr. Lubertha South     PATIENT INFORMATION:  Natalie Dodson is a 62 y.o., female (DOB=[birth year]).  PROCEDURE: Procedure(s) requested: colonoscopy Procedure Reason: screening for colon cancer  PATIENT REVIEW QUESTIONS: The patient reports the following:   1. Diabetes Melitis: no 2. Joint replacements in the past 12 months: no 3. Major health problems in the past 3 months: no 4. Has an artificial valve or MVP:no 5. Has been advised in past to take antibiotics in advance of a procedure like teeth cleaning: no}    MEDICATIONS & ALLERGIES:    Patient reports the following regarding taking any blood thinners:   Plavix? no Aspirin?no Coumadin?  no  Patient confirms/reports the following medications:  Current Outpatient Prescriptions  Medication Sig Dispense Refill  . Biotin 5000 MCG TABS Take by mouth daily.      . Lorcaserin HCl (BELVIQ) 10 MG TABS Take 10 mg by mouth 2 (two) times daily.  30 tablet  0  . Multiple Vitamin (MULTIVITAMIN) capsule Take 2 capsules by mouth daily.       . vitamin B-12 (CYANOCOBALAMIN) 100 MCG tablet Take 50 mcg by mouth daily.       No current facility-administered medications for this visit.    Patient confirms/reports the following allergies:  Allergies  Allergen Reactions  . Vancomycin Hives    Can spread all over the body.    Patient is appropriate to schedule for requested procedure(s): yes  AUTHORIZATION INFORMATION Primary Insurance:   ID #:   Group #:  Pre-Cert / Auth required: Pre-Cert / Auth #:   Secondary Insurance:  ID #:   Group #:  Pre-Cert / Auth required:  Pre-Cert / Auth #:   No orders of the defined types were placed in this encounter.    SCHEDULE INFORMATION: Procedure has been scheduled as follows:  Date: 08/18/2012   Time: 7:30 AM  Location: [facility] Short Stay  This  Gastroenterology Pre-Precedure Form is being routed to the following provider(s) for review: R. Roetta Sessions, MD

## 2012-08-03 NOTE — Telephone Encounter (Signed)
Opened in error. ( Had open encounter for 07/30/2012 to complete triage info).

## 2012-08-04 LAB — VITAMIN D 25 HYDROXY (VIT D DEFICIENCY, FRACTURES): Vit D, 25-Hydroxy: 34 ng/mL (ref 30–89)

## 2012-08-04 MED ORDER — PEG-KCL-NACL-NASULF-NA ASC-C 100 G PO SOLR: 1.0000 | ORAL | Status: DC

## 2012-08-04 NOTE — Telephone Encounter (Signed)
Patient appropriate to schedule. She shouldn't have difficulty with the prep; pace herself with the fluid intake.

## 2012-08-04 NOTE — Telephone Encounter (Signed)
Called and explained to pt. She will call if she has any problems.

## 2012-08-04 NOTE — Telephone Encounter (Signed)
Rx sent to the pharmacy and instructions mailed to pt.  

## 2012-08-05 ENCOUNTER — Encounter: Payer: Self-pay | Admitting: Nurse Practitioner

## 2012-08-18 ENCOUNTER — Ambulatory Visit (HOSPITAL_COMMUNITY)
Admission: RE | Admit: 2012-08-18 | Discharge: 2012-08-18 | Disposition: A | Discharge status: Home / Self Care | Payer: BC Managed Care – PPO | Source: Ambulatory Visit | Attending: Internal Medicine | Admitting: Internal Medicine

## 2012-08-18 ENCOUNTER — Encounter (HOSPITAL_COMMUNITY): Payer: Self-pay | Admitting: *Deleted

## 2012-08-18 ENCOUNTER — Encounter (HOSPITAL_COMMUNITY)
Admission: RE | Disposition: A | Discharge status: Home / Self Care | Payer: Self-pay | Source: Ambulatory Visit | Attending: Internal Medicine

## 2012-08-18 DIAGNOSIS — R197 Diarrhea, unspecified: Secondary | ICD-10-CM | POA: Insufficient documentation

## 2012-08-18 DIAGNOSIS — K620 Anal polyp: Secondary | ICD-10-CM

## 2012-08-18 DIAGNOSIS — K5730 Diverticulosis of large intestine without perforation or abscess without bleeding: Secondary | ICD-10-CM

## 2012-08-18 DIAGNOSIS — D128 Benign neoplasm of rectum: Secondary | ICD-10-CM | POA: Insufficient documentation

## 2012-08-18 DIAGNOSIS — K621 Rectal polyp: Secondary | ICD-10-CM

## 2012-08-18 DIAGNOSIS — Z9884 Bariatric surgery status: Secondary | ICD-10-CM | POA: Insufficient documentation

## 2012-08-18 DIAGNOSIS — Z1211 Encounter for screening for malignant neoplasm of colon: Secondary | ICD-10-CM | POA: Insufficient documentation

## 2012-08-18 DIAGNOSIS — D129 Benign neoplasm of anus and anal canal: Secondary | ICD-10-CM | POA: Insufficient documentation

## 2012-08-18 HISTORY — PX: COLONOSCOPY: SHX5424

## 2012-08-18 HISTORY — DX: Fibromyalgia: M79.7

## 2012-08-18 HISTORY — DX: Gastro-esophageal reflux disease without esophagitis: K21.9

## 2012-08-18 LAB — CLOSTRIDIUM DIFFICILE BY PCR: Toxigenic C. Difficile by PCR: NEGATIVE

## 2012-08-18 LAB — IGA: IgA: 187 mg/dL (ref 69–380)

## 2012-08-18 SURGERY — COLONOSCOPY
Anesthesia: Moderate Sedation

## 2012-08-18 MED ORDER — ONDANSETRON HCL 4 MG/2ML IJ SOLN
INTRAMUSCULAR | Status: DC | PRN
  Administered 2012-08-18: 4 mg via INTRAVENOUS

## 2012-08-18 MED ORDER — MEPERIDINE HCL 100 MG/ML IJ SOLN
INTRAMUSCULAR | Status: AC
  Filled 2012-08-18: qty 2

## 2012-08-18 MED ORDER — ONDANSETRON HCL 4 MG/2ML IJ SOLN
INTRAMUSCULAR | Status: AC
  Filled 2012-08-18: qty 2

## 2012-08-18 MED ORDER — SODIUM CHLORIDE 0.9 % IV SOLN
INTRAVENOUS | Status: DC
  Administered 2012-08-18: 07:00:00 via INTRAVENOUS

## 2012-08-18 MED ORDER — MIDAZOLAM HCL 5 MG/5ML IJ SOLN
INTRAMUSCULAR | Status: DC | PRN
  Administered 2012-08-18: 2 mg via INTRAVENOUS
  Administered 2012-08-18: 1 mg via INTRAVENOUS
  Administered 2012-08-18: 2 mg via INTRAVENOUS

## 2012-08-18 MED ORDER — MEPERIDINE HCL 100 MG/ML IJ SOLN
INTRAMUSCULAR | Status: DC | PRN
  Administered 2012-08-18: 50 mg via INTRAVENOUS
  Administered 2012-08-18 (×2): 25 mg via INTRAVENOUS

## 2012-08-18 MED ORDER — SIMETHICONE 40 MG/0.6ML PO SUSP
ORAL | Status: DC | PRN
  Administered 2012-08-18: 08:00:00

## 2012-08-18 MED ORDER — MIDAZOLAM HCL 5 MG/5ML IJ SOLN
INTRAMUSCULAR | Status: AC
  Filled 2012-08-18: qty 10

## 2012-08-18 NOTE — Op Note (Signed)
[facility] [address] [place], [address]   COLONOSCOPY PROCEDURE REPORT  PATIENT: Natalie Dodson, Natalie Dodson  MR#:         [id number] BIRTHDATE: 23-Oct-1950 , 62  yrs. old GENDER: Female ENDOSCOPIST: R.  Roetta Sessions, MD FACP [facility] REFERRED BY:  Simone Curia, M.D. PROCEDURE DATE:  08/18/2012 PROCEDURE:     Ileocolonoscopy with segmental biopsy and stool sampling  INDICATIONS: First ever average risk colorectal cancer screening examination; chronic diarrhea of long-standing duration  INFORMED CONSENT:  The risks, benefits, alternatives and imponderables including but not limited to bleeding, perforation as well as the possibility of a missed lesion have been reviewed.  The potential for biopsy, lesion removal, etc. have also been discussed.  Questions have been answered.  All parties agreeable. Please see the history and physical in the medical record for more information.  MEDICATIONS: Versed 5 mg IV and Demerol 100 mg IV in divided doses. Zofran 4 mg IV  DESCRIPTION OF PROCEDURE:  After a digital rectal exam was performed, the EC-3890Li ([id number])  colonoscope was advanced from the anus through the rectum and colon to the area of the cecum, ileocecal valve and appendiceal orifice.  The cecum was deeply intubated.  These structures were well-seen and photographed for the record.  From the level of the cecum and ileocecal valve, the scope was slowly and cautiously withdrawn.  The mucosal surfaces were carefully surveyed utilizing scope tip deflection to facilitate fold flattening as needed.  The scope was pulled down into the rectum where a thorough examination including retroflexion was performed.    FINDINGS:  Adequate preparation. Internal hemorrhoids. (1) diminutive polyp just inside the anal verge; otherwise, the remainder of the rectal mucosa appeared normal. Scattered left-sided diverticula; the remainder of the colonic mucosa appeared normal. The  distal 10 cm of terminal ileal mucosa appeared normal.  THERAPEUTIC / DIAGNOSTIC MANEUVERS PERFORMED:  The rectal polyp was cold biopsied/removed.Segmental biopsies of the sigmoid and ascending  segments taken to evaluate for microscopic colitis. COMPLICATIONS:  CECAL WITHDRAWAL TIME:  10 minutes  IMPRESSION:  Rectal polyp-removed. Colonic diverticulosis. Status post segmental biopsies and stool sampling  RECOMMENDATIONS: Followup on pending studies. Celiac screen.   _______________________________ eSigned:  R. Roetta Sessions, MD FACP [facility] 08/18/2012 8:24 AM   CC:

## 2012-08-18 NOTE — H&P (Signed)
Primary Care Physician:  Harlow Asa, MD Primary Gastroenterologist:  Dr. Jena Gauss  Pre-Procedure History & Physical: HPI:  Natalie Dodson is a 62 y.o. female is here for a screening colonoscopy.   Patient has chronic diarrhea. History of bariatric surgery x2. No prior colonoscopy. No family history of polyps or colon cancer.  Past Medical History  Diagnosis Date  . MRSA (methicillin resistant staph aureus) culture positive 11-06-11    cultured from back surgery hardware-hardware removed  . PONV (postoperative nausea and vomiting) 11-06-11    nausea with anesthesia  . Arthritis   . GERD (gastroesophageal reflux disease)   . Fibromyalgia     Past Surgical History  Procedure Laterality Date  . 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 Appendectomy    . Hernia repair      umbilical  . Tonsillectomy    . Rotator cuff repair      bilateral  . Elbow surgery  11-06-11    bilateral "tendon release"  . Abdominal hysterectomy      then ovarian removal  . Back surgery      X6  . Cervical fusion    . Removal of laparoscopic gastric band  01/02/10    port removed prior to this surgery  . Carpal tunnel release      right Mikami  . 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 Bilateral oophorectomy      Prior to Admission medications   Medication Sig Start Date End Date Taking? Authorizing Provider  Biotin 5000 MCG TABS Take 1 tablet by mouth daily.    Yes Historical Provider, MD  Multiple Vitamin (MULTIVITAMIN) capsule Take 2 capsules by mouth daily.    Yes Historical Provider, MD  peg 3350 powder (MOVIPREP) 100 G SOLR Take 1 kit (100 g total) by mouth as directed. 08/04/12  Yes West Bali, MD  vitamin B-12 (CYANOCOBALAMIN) 100 MCG tablet Take 50 mcg by mouth daily.   Yes Historical Provider, MD  Lorcaserin HCl (BELVIQ) 10 MG TABS Take 10 mg by mouth 2 (two) times daily. 07/30/12   Campbell Riches, NP    Allergies as of 07/30/2012 - Review Complete 07/30/2012  Allergen Reaction Noted  .  Vancomycin Hives 10/26/2010    Family History  Problem Relation Age of Onset  . Colon cancer Neg Hx     History   Social History  . Marital Status: Widowed    Spouse Name: N/A    Number of Children: N/A  . Years of Education: N/A   Occupational History  . 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 Smoking status: Never Smoker   . Smokeless tobacco: Not on file  . Alcohol Use: Yes     Comment: once every 6 months  . Drug Use: No  . Sexually Active: Yes   Other Topics Concern  . Not on file   Social History Narrative  . No narrative on file    Review of Systems: See HPI, otherwise negative ROS  Physical Exam: BP 141/73  Pulse 57  Temp(Src) 98.1 F (36.7 C) (Oral)  Resp 18  Ht 5\' 2"  (1.575 m)  Wt 184 lb (83.462 kg)  BMI 33.65 kg/m2  SpO2 97% General:   Alert,  Well-developed, well-nourished, pleasant and cooperative in NAD Head:  Normocephalic and atraumatic. Eyes:  Sclera clear, no icterus.   Conjunctiva pink. Ears:  Normal auditory acuity. Nose:  No deformity, discharge,  or lesions. Mouth:  No deformity or lesions, dentition normal. Neck:  Supple; no masses or thyromegaly. Lungs:  Clear throughout to auscultation.   No wheezes, crackles, or rhonchi. No acute distress. Heart:  Regular rate and rhythm; no murmurs, clicks, rubs,  or gallops. Abdomen: Nondistended. Multiple laparoscopic surgical scars. Normal bowel sounds, without guarding, and without rebound.  No mass or hepatosplenomegaly Msk:  Symmetrical without gross deformities. Normal posture. Pulses:  Normal pulses noted. Extremities:  Without clubbing or edema. Neurologic:  Alert and  oriented x4;  grossly normal neurologically. Skin:  Intact without significant lesions or rashes. Cervical Nodes:  No significant cervical adenopathy. Psych:  Alert and cooperative. Normal mood and affect.  Impression/Plan: Natalie Dodson is now here to undergo a screening colonoscopy.   First-ever average risk screening examination. Chronic diarrhea needs further evaluation. Need to rule out microscopic colitis with segmental biopsies, etc.  ileum needs to be assessed.The risks, benefits, limitations, alternatives and imponderables have been reviewed with the patient. Questions have been answered. All parties are agreeable.   Risks, benefits, limitations, imponderables and alternatives regarding colonoscopy have been reviewed with the patient. Questions have been answered. All parties agreeable.

## 2012-08-19 ENCOUNTER — Ambulatory Visit (HOSPITAL_COMMUNITY)
Admission: RE | Admit: 2012-08-19 | Discharge: 2012-08-19 | Disposition: A | Discharge status: Home / Self Care | Payer: BC Managed Care – PPO | Source: Ambulatory Visit | Attending: Nurse Practitioner | Admitting: Nurse Practitioner

## 2012-08-19 DIAGNOSIS — M899 Disorder of bone, unspecified: Secondary | ICD-10-CM | POA: Insufficient documentation

## 2012-08-19 DIAGNOSIS — M949 Disorder of cartilage, unspecified: Secondary | ICD-10-CM | POA: Insufficient documentation

## 2012-08-19 DIAGNOSIS — Z780 Asymptomatic menopausal state: Secondary | ICD-10-CM | POA: Insufficient documentation

## 2012-08-19 LAB — TISSUE TRANSGLUTAMINASE, IGA: Tissue Transglutaminase Ab, IgA: 2.1 U/mL (ref <20)

## 2012-08-19 LAB — OVA AND PARASITE EXAMINATION: Ova and parasites: NONE SEEN

## 2012-08-19 LAB — FECAL LACTOFERRIN, QUANT: Fecal Lactoferrin: NEGATIVE

## 2012-08-20 ENCOUNTER — Encounter (HOSPITAL_COMMUNITY): Payer: Self-pay | Admitting: Internal Medicine

## 2012-08-21 ENCOUNTER — Other Ambulatory Visit: Payer: Self-pay | Admitting: Nurse Practitioner

## 2012-08-21 DIAGNOSIS — M8580 Other specified disorders of bone density and structure, unspecified site: Secondary | ICD-10-CM

## 2012-08-28 ENCOUNTER — Encounter: Payer: Self-pay | Admitting: Internal Medicine

## 2012-08-31 NOTE — Progress Notes (Unsigned)
Letter mailed to pt. Natalie Dodson, please cc pcp. Darl Pikes, please schedule appt.

## 2012-08-31 NOTE — Progress Notes (Unsigned)
Letter from: Corbin Ade   Send letter to patient.  Send copy of letter with path to referring provider and PCP.   Pt needs an ov w extender to offer further evaluation of diarrhea in coming weks

## 2012-09-08 NOTE — Progress Notes (Signed)
Pt is aware of OV on 7/17 with AS

## 2012-09-15 ENCOUNTER — Encounter: Payer: Self-pay | Admitting: Internal Medicine

## 2012-09-17 ENCOUNTER — Ambulatory Visit: Payer: BC Managed Care – PPO | Admitting: Gastroenterology

## 2012-09-17 ENCOUNTER — Telehealth: Payer: Self-pay | Admitting: Gastroenterology

## 2012-09-17 NOTE — Telephone Encounter (Signed)
Patient is a no show

## 2012-09-17 NOTE — Telephone Encounter (Signed)
Please send letter for f/u.  

## 2012-09-22 ENCOUNTER — Encounter: Payer: Self-pay | Admitting: General Practice

## 2012-09-22 NOTE — Telephone Encounter (Signed)
LETTER MAILED

## 2013-01-06 ENCOUNTER — Telehealth: Payer: Self-pay | Admitting: Family Medicine

## 2013-01-06 NOTE — Telephone Encounter (Signed)
Cold sore on both side of mouth.  Wants to know if there is some type of medication that can be phoned in to [facility].  States she these have been there about a month, she has changed her toothbrush and tried over the counter medications.  Please call Patient. Thanks

## 2013-01-07 NOTE — Telephone Encounter (Signed)
Pt calling to check an see if we are going to do anything for her cold sores? Please call pt when done, thank you

## 2013-01-08 MED ORDER — KETOCONAZOLE 2 % EX CREA: 1.0000 "application " | TOPICAL_CREAM | Freq: Two times a day (BID) | CUTANEOUS | Status: DC

## 2013-01-08 NOTE — Telephone Encounter (Signed)
Nurse to call that is usually not cold sores for that long. Is it at the corners of the mouth, extending to the skin, cracking frequently, that's chronic yeast infxn and 15 g ketcon cream bid will get rid of it. If not, let me know

## 2013-01-08 NOTE — Telephone Encounter (Signed)
Rx sent electronically to pharmacy. Patient notified. 

## 2013-03-25 ENCOUNTER — Encounter: Payer: Self-pay | Admitting: Family Medicine

## 2013-06-23 ENCOUNTER — Ambulatory Visit: Payer: BC Managed Care – PPO | Admitting: Family Medicine

## 2013-06-30 ENCOUNTER — Telehealth (HOSPITAL_COMMUNITY): Payer: Self-pay

## 2013-06-30 NOTE — Telephone Encounter (Signed)
This patient is overdue for recommended follow-up with a bariatric surgeon at [facility]. Call attempted today to reestablish post-op care with [facility], but unable to reach patient by phone.  A letter will be mailed to the patient today to the address on file from [facility] advising the patient on the benefits of follow-up care and directing them to call [facility] at [phone] to schedule an appointment at their earliest convenience.  ° °Amanda T. Fleming °Bariatric Office Coordinator °[phone] ° °

## 2013-10-21 ENCOUNTER — Encounter (HOSPITAL_COMMUNITY): Payer: Self-pay | Admitting: Emergency Medicine

## 2013-10-21 ENCOUNTER — Emergency Department (HOSPITAL_COMMUNITY)
Admission: EM | Admit: 2013-10-21 | Discharge: 2013-10-21 | Disposition: A | Discharge status: Home / Self Care | Payer: BC Managed Care – PPO | Attending: Emergency Medicine | Admitting: Emergency Medicine

## 2013-10-21 ENCOUNTER — Emergency Department (HOSPITAL_COMMUNITY): Payer: BC Managed Care – PPO

## 2013-10-21 DIAGNOSIS — R079 Chest pain, unspecified: Secondary | ICD-10-CM | POA: Insufficient documentation

## 2013-10-21 DIAGNOSIS — Z8719 Personal history of other diseases of the digestive system: Secondary | ICD-10-CM | POA: Diagnosis not present

## 2013-10-21 DIAGNOSIS — R0789 Other chest pain: Secondary | ICD-10-CM | POA: Insufficient documentation

## 2013-10-21 LAB — BASIC METABOLIC PANEL
ANION GAP: 10 (ref 5–15)
BUN: 8 mg/dL (ref 6–23)
CHLORIDE: 108 meq/L (ref 96–112)
CO2: 27 mEq/L (ref 19–32)
Calcium: 9.2 mg/dL (ref 8.4–10.5)
Creatinine, Ser: 0.81 mg/dL (ref 0.50–1.10)
GFR, EST AFRICAN AMERICAN: 89 mL/min — AB (ref >90)
GFR, EST NON AFRICAN AMERICAN: 77 mL/min — AB (ref >90)
Glucose, Bld: 99 mg/dL (ref 70–99)
POTASSIUM: 4.6 meq/L (ref 3.7–5.3)
SODIUM: 145 meq/L (ref 137–147)

## 2013-10-21 LAB — CBC
HEMATOCRIT: 38.6 % (ref 36.0–46.0)
Hemoglobin: 13.1 g/dL (ref 12.0–15.0)
MCH: 29.5 pg (ref 26.0–34.0)
MCHC: 33.9 g/dL (ref 30.0–36.0)
MCV: 86.9 fL (ref 78.0–100.0)
PLATELETS: 207 10*3/uL (ref 150–400)
RBC: 4.44 MIL/uL (ref 3.87–5.11)
RDW: 12.9 % (ref 11.5–15.5)
WBC: 7 10*3/uL (ref 4.0–10.5)

## 2013-10-21 LAB — TROPONIN I: Troponin I: <0.3 ng/mL (ref <0.30)

## 2013-10-21 NOTE — ED Notes (Signed)
Pt reports chest tightness,nausea,sob x1 week. Pt reports intermittent chest tightness, weakness today. nad noted. No dyspnea noted in triage.

## 2013-10-21 NOTE — Discharge Instructions (Signed)
Your caregiver has diagnosed you as having chest pain that is not specific for one problem, but does not require admission.  You are at low risk for an acute heart condition or other serious illness. Chest pain comes from many different causes.  °SEEK IMMEDIATE MEDICAL ATTENTION IF: °You have severe chest pain, especially if the pain is crushing or pressure-like and spreads to the arms, back, neck, or jaw, or if you have sweating, nausea (feeling sick to your stomach), or shortness of breath. THIS IS AN EMERGENCY. Don't wait to see if the pain will go away. Get medical help at once. Call 911 or 0 (operator). DO NOT drive yourself to the hospital.  °Your chest pain gets worse and does not go away with rest.  °You have an attack of chest pain lasting longer than usual, despite rest and treatment with the medications your caregiver has prescribed.  °You wake from sleep with chest pain or shortness of breath.  °You feel dizzy or faint.  °You have chest pain not typical of your usual pain for which you originally saw your caregiver. °Chest Pain (Nonspecific) °It is often hard to give a specific diagnosis for the cause of chest pain. There is always a chance that your pain could be related to something serious, such as a heart attack or a blood clot in the lungs. You need to follow up with your health care provider for further evaluation. °CAUSES  °· Heartburn. °· Pneumonia or bronchitis. °· Anxiety or stress. °· Inflammation around your heart (pericarditis) or lung (pleuritis or pleurisy). °· A blood clot in the lung. °· A collapsed lung (pneumothorax). It can develop suddenly on its own (spontaneous pneumothorax) or from trauma to the chest. °· Shingles infection (herpes zoster virus). °The chest wall is composed of bones, muscles, and cartilage. Any of these can be the source of the pain. °· The bones can be bruised by injury. °· The muscles or cartilage can be strained by coughing or overwork. °· The cartilage can be  affected by inflammation and become sore (costochondritis). °DIAGNOSIS  °Lab tests or other studies may be needed to find the cause of your pain. Your health care provider may have you take a test called an ambulatory electrocardiogram (ECG). An ECG records your heartbeat patterns over a 24-hour period. You may also have other tests, such as: °· Transthoracic echocardiogram (TTE). During echocardiography, sound waves are used to evaluate how blood flows through your heart. °· Transesophageal echocardiogram (TEE). °· Cardiac monitoring. This allows your health care provider to monitor your heart rate and rhythm in real time. °· Holter monitor. This is a portable device that records your heartbeat and can help diagnose heart arrhythmias. It allows your health care provider to track your heart activity for several days, if needed. °· Stress tests by exercise or by giving medicine that makes the heart beat faster. °TREATMENT  °· Treatment depends on what may be causing your chest pain. Treatment may include: °¨ Acid blockers for heartburn. °¨ Anti-inflammatory medicine. °¨ Pain medicine for inflammatory conditions. °¨ Antibiotics if an infection is present. °· You may be advised to change lifestyle habits. This includes stopping smoking and avoiding alcohol, caffeine, and chocolate. °· You may be advised to keep your head raised (elevated) when sleeping. This reduces the chance of acid going backward from your stomach into your esophagus. °Most of the time, nonspecific chest pain will improve within 2-3 days with rest and mild pain medicine.  °HOME CARE INSTRUCTIONS  °·   If antibiotics were prescribed, take them as directed. Finish them even if you start to feel better. °· For the next few days, avoid physical activities that bring on chest pain. Continue physical activities as directed. °· Do not use any tobacco products, including cigarettes, chewing tobacco, or electronic cigarettes. °· Avoid drinking alcohol. °· Only  take medicine as directed by your health care provider. °· Follow your health care provider's suggestions for further testing if your chest pain does not go away. °· Keep any follow-up appointments you made. If you do not go to an appointment, you could develop lasting (chronic) problems with pain. If there is any problem keeping an appointment, call to reschedule. °SEEK MEDICAL CARE IF:  °· Your chest pain does not go away, even after treatment. °· You have a rash with blisters on your chest. °· You have a fever. °SEEK IMMEDIATE MEDICAL CARE IF:  °· You have increased chest pain or pain that spreads to your arm, neck, jaw, back, or abdomen. °· You have shortness of breath. °· You have an increasing cough, or you cough up blood. °· You have severe back or abdominal pain. °· You feel nauseous or vomit. °· You have severe weakness. °· You faint. °· You have chills. °This is an emergency. Do not wait to see if the pain will go away. Get medical help at once. Call your local emergency services (911 in [place] Do not drive yourself to the hospital. °MAKE SURE YOU:  °· Understand these instructions. °· Will watch your condition. °· Will get help right away if you are not doing well or get worse. °Document Released: 11/28/2004 Document Revised: 02/23/2013 Document Reviewed: 09/24/2007 °Exit[employer]® Patient Information ©2015 [employer]. This information is not intended to replace advice given to you by your health care provider. Make sure you discuss any questions you have with your health care provider. ° °

## 2013-10-21 NOTE — ED Provider Notes (Signed)
CSN: [id number]     Arrival date & time 10/21/13  1153 History  This chart was scribed for Ephraim Hamburger, MD by Ludger Nutting, ED Scribe. This patient was seen in room APA12/APA12 and the patient's care was started 2:04 PM.    Chief Complaint  Patient presents with  . Chest Pain    The history is provided by the patient. No language interpreter was used.    HPI Comments: Natalie Dodson is a 63 y.o. female who presents to the Emergency Department complaining of a 2 hour episode of lower chest pain that occurred 1 week ago. Patient states since then, she has experienced intermittent episodes of similar chest pain that lasts up to a few minutes at a time. She describes the pain as sharp, stabbing and but states it is mild. She states the last episode was this morning but denies pain currently. She reports associated SOB but states this has been ongoing since before the onset of the chest pain and is not worse currently. She has not taken any OTC medications for her symptoms. She denies history of similar symptoms. She denies history of DM, HTN, HLD. She denies leg swelling or pain.   Past Medical History  Diagnosis Date  . MRSA (methicillin resistant staph aureus) culture positive 11-06-11    cultured from back surgery hardware-hardware removed  . PONV (postoperative nausea and vomiting) 11-06-11    nausea with anesthesia  . Arthritis   . GERD (gastroesophageal reflux disease)   . Fibromyalgia    Past Surgical History  Procedure Laterality Date  . 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 Appendectomy    . Hernia repair      umbilical  . Tonsillectomy    . Rotator cuff repair      bilateral  . Elbow surgery  11-06-11    bilateral "tendon release"  . Abdominal hysterectomy      then ovarian removal  . Back surgery      X6  . Cervical fusion    . Removal of laparoscopic gastric band  01/02/10    port removed prior to this surgery  . Carpal tunnel release      right Thaw  . 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 Bilateral oophorectomy    . Colonoscopy N/A 08/18/2012    RMR: Rectal polyp-removed. Colonic diverticulosis s/p segmental biopsies and stool sampling   Family History  Problem Relation Age of Onset  . Colon cancer Neg Hx    History  Substance Use Topics  . Smoking status: Never Smoker   . Smokeless tobacco: Not on file  . Alcohol Use: No   OB History   Grav Para Term Preterm Abortions TAB SAB Ect Mult Living                 Review of Systems  Constitutional: Negative for diaphoresis.  Respiratory: Positive for shortness of breath.   Cardiovascular: Positive for chest pain. Negative for leg swelling.  Gastrointestinal: Negative for nausea, vomiting and abdominal pain.  Musculoskeletal: Negative for myalgias.  All other systems reviewed and are negative.     Allergies  Vancomycin  Home Medications   Prior to Admission medications   Not on File   BP 109/79  Pulse 65  Temp(Src) 98.7 F (37.1 C) (Oral)  Resp 18  Ht 5' 3.5" (1.613 m)  Wt 188 lb (85.276 kg)  BMI 32.78 kg/m2  SpO2 99% Physical Exam  Nursing note and  vitals reviewed. Constitutional: She is oriented to person, place, and time. She appears well-developed and well-nourished.  HENT:  Head: Normocephalic and atraumatic.  Cardiovascular: Normal rate, regular rhythm and normal heart sounds.   Pulmonary/Chest: Effort normal and breath sounds normal. No respiratory distress. She has no wheezes. She has no rales. She exhibits no tenderness.  Abdominal: Soft. She exhibits no distension. There is no tenderness.  Musculoskeletal: She exhibits no edema and no tenderness.  Neurological: She is alert and oriented to person, place, and time.  Skin: Skin is warm and dry.  Psychiatric: She has a normal mood and affect.    ED Course  Procedures (including critical care time)  DIAGNOSTIC STUDIES: Oxygen Saturation is 99% on RA, normal by my interpretation.    COORDINATION OF  CARE: 2:12 PM Discussed treatment plan with pt at bedside and pt agreed to plan.   Labs Review Labs Reviewed  BASIC METABOLIC PANEL - Abnormal; Notable for the following:    GFR calc non Af Amer 77 (*)    GFR calc Af Amer 89 (*)    All other components within normal limits  CBC  TROPONIN I    Imaging Review Dg Chest 2 View  10/21/2013   CLINICAL DATA:  Chest pain  EXAM: CHEST  2 VIEW  COMPARISON:  06/30/2008  FINDINGS: Cardiomediastinal silhouette is stable. No acute infiltrate or pleural effusion. No pulmonary edema. Bony thorax is unremarkable.  IMPRESSION: No active cardiopulmonary disease.   Electronically Signed   By: Lahoma Crocker M.D.   On: 10/21/2013 12:22     EKG Interpretation   Date/Time:  Thursday October 21 2013 12:03:00 EDT Ventricular Rate:  68 PR Interval:  144 QRS Duration: 68 QT Interval:  426 QTC Calculation: 452 R Axis:   14 Text Interpretation:  Sinus rhythm with Premature supraventricular  complexes Otherwise normal ECG No significant change since last tracing  Confirmed by Regana Kemple  MD, Iona Stay ([id number]) on 10/21/2013 1:44:31 PM      MDM   Final diagnoses:  Chest pain, unspecified chest pain type    Patient's pain is atypical but somewhat concerning for a possible cardiac etiology. I discussed options with the patient and she has a benign EKG and negative troponin. She declines observation stating that she wishes she didn't come to the hospital in the first place. I discussed getting a second troponin given that her pain was recently dissipated this morning. She also declines this, understanding that we may be missing a myocardial infarction. I will refer to [facility] and stress the importance of following up with this even others 07 MI. Patient seems to understand and will return to the ED if symptoms recur or worsen.  I personally performed the services described in this documentation, which was scribed in my presence. The recorded information has  been reviewed and is accurate.   Ephraim Hamburger, MD 10/21/13 [phone]

## 2013-12-10 ENCOUNTER — Telehealth: Payer: Self-pay | Admitting: Family Medicine

## 2013-12-10 MED ORDER — SCOPOLAMINE 1 MG/3DAYS TD PT72: 1.0000 | MEDICATED_PATCH | TRANSDERMAL | Status: DC

## 2013-12-10 NOTE — Telephone Encounter (Signed)
scopolamin patches, may use q3d prn, read medicine side effewct sheet that comes with script, send in 1 box

## 2013-12-10 NOTE — Telephone Encounter (Signed)
Medication sent to pharmacy. Patient was notified.  

## 2013-12-10 NOTE — Telephone Encounter (Signed)
Patient needs new prescription for sea sick patches called into [place]. [place] on [place].

## 2013-12-10 NOTE — Addendum Note (Signed)
Addended by: Jesusita Oka on: 12/10/2013 05:15 PM   Modules accepted: Orders

## 2014-01-10 ENCOUNTER — Ambulatory Visit (INDEPENDENT_AMBULATORY_CARE_PROVIDER_SITE_OTHER): Payer: BC Managed Care – PPO | Admitting: Nurse Practitioner

## 2014-01-10 ENCOUNTER — Encounter: Payer: Self-pay | Admitting: Nurse Practitioner

## 2014-01-10 VITALS — BP 122/70 | HR 80 | Ht 62.25 in | Wt 187.0 lb

## 2014-01-10 DIAGNOSIS — M545 Low back pain, unspecified: Secondary | ICD-10-CM

## 2014-01-10 DIAGNOSIS — Z01419 Encounter for gynecological examination (general) (routine) without abnormal findings: Secondary | ICD-10-CM

## 2014-01-10 DIAGNOSIS — Z0000 Encounter for general adult medical examination without abnormal findings: Secondary | ICD-10-CM

## 2014-01-10 DIAGNOSIS — F411 Generalized anxiety disorder: Secondary | ICD-10-CM

## 2014-01-10 DIAGNOSIS — F430 Acute stress reaction: Secondary | ICD-10-CM

## 2014-01-10 DIAGNOSIS — F419 Anxiety disorder, unspecified: Secondary | ICD-10-CM

## 2014-01-10 DIAGNOSIS — R5383 Other fatigue: Secondary | ICD-10-CM

## 2014-01-10 DIAGNOSIS — Z23 Encounter for immunization: Secondary | ICD-10-CM

## 2014-01-10 DIAGNOSIS — M2550 Pain in unspecified joint: Secondary | ICD-10-CM

## 2014-01-10 MED ORDER — HYDROCODONE-ACETAMINOPHEN 10-325 MG PO TABS: 1.0000 | ORAL_TABLET | Freq: Three times a day (TID) | ORAL | Status: DC | PRN

## 2014-01-10 MED ORDER — CITALOPRAM HYDROBROMIDE 20 MG PO TABS: 20.0000 mg | ORAL_TABLET | Freq: Every day | ORAL | Status: DC

## 2014-01-11 ENCOUNTER — Encounter: Payer: BC Managed Care – PPO | Admitting: Nurse Practitioner

## 2014-01-11 LAB — BASIC METABOLIC PANEL
BUN: 13 mg/dL (ref 6–23)
CO2: 27 mEq/L (ref 19–32)
Calcium: 9.4 mg/dL (ref 8.4–10.5)
Chloride: 106 mEq/L (ref 96–112)
Creat: 0.75 mg/dL (ref 0.50–1.10)
Glucose, Bld: 96 mg/dL (ref 70–99)
POTASSIUM: 4.5 meq/L (ref 3.5–5.3)
Sodium: 141 mEq/L (ref 135–145)

## 2014-01-11 LAB — LIPID PANEL
CHOLESTEROL: 224 mg/dL — AB (ref 0–200)
HDL: 57 mg/dL (ref >39)
LDL Cholesterol: 139 mg/dL — ABNORMAL HIGH (ref 0–99)
Total CHOL/HDL Ratio: 3.9 Ratio
Triglycerides: 138 mg/dL (ref <150)
VLDL: 28 mg/dL (ref 0–40)

## 2014-01-11 LAB — CBC WITH DIFFERENTIAL/PLATELET
BASOS ABS: 0.1 10*3/uL (ref 0.0–0.1)
Basophils Relative: 1 % (ref 0–1)
EOS ABS: 0.1 10*3/uL (ref 0.0–0.7)
EOS PCT: 2 % (ref 0–5)
HCT: 38.4 % (ref 36.0–46.0)
Hemoglobin: 13.2 g/dL (ref 12.0–15.0)
LYMPHS PCT: 38 % (ref 12–46)
Lymphs Abs: 2.2 10*3/uL (ref 0.7–4.0)
MCH: 29.2 pg (ref 26.0–34.0)
MCHC: 34.4 g/dL (ref 30.0–36.0)
MCV: 85 fL (ref 78.0–100.0)
Monocytes Absolute: 0.4 10*3/uL (ref 0.1–1.0)
Monocytes Relative: 7 % (ref 3–12)
NEUTROS PCT: 52 % (ref 43–77)
Neutro Abs: 3 10*3/uL (ref 1.7–7.7)
PLATELETS: 244 10*3/uL (ref 150–400)
RBC: 4.52 MIL/uL (ref 3.87–5.11)
RDW: 14.1 % (ref 11.5–15.5)
WBC: 5.7 10*3/uL (ref 4.0–10.5)

## 2014-01-11 LAB — HEPATIC FUNCTION PANEL
ALT: 11 U/L (ref 0–35)
AST: 18 U/L (ref 0–37)
Albumin: 3.9 g/dL (ref 3.5–5.2)
Alkaline Phosphatase: 95 U/L (ref 39–117)
BILIRUBIN DIRECT: 0.1 mg/dL (ref 0.0–0.3)
Indirect Bilirubin: 0.5 mg/dL (ref 0.2–1.2)
Total Bilirubin: 0.6 mg/dL (ref 0.2–1.2)
Total Protein: 6.7 g/dL (ref 6.0–8.3)

## 2014-01-11 LAB — RHEUMATOID FACTOR: Rhuematoid fact SerPl-aCnc: <10 IU/mL (ref <=14)

## 2014-01-11 LAB — TSH: TSH: 2.285 u[IU]/mL (ref 0.350–4.500)

## 2014-01-12 LAB — ANA: Anti Nuclear Antibody(ANA): NEGATIVE

## 2014-01-13 ENCOUNTER — Encounter: Payer: Self-pay | Admitting: Nurse Practitioner

## 2014-01-13 ENCOUNTER — Telehealth: Payer: Self-pay | Admitting: Nurse Practitioner

## 2014-01-13 NOTE — Progress Notes (Signed)
Subjective:    Patient ID: Natalie Dodson, female    DOB: Feb 13, 1951, 63 y.o.   MRN: [id number]  HPI Presents for her wellness exam. Has significant orthopedic issues including knee pain especially at night. Has had previous low back surgery, has occasional back pain and problem using stairs. This is limited her exercise. Same sexual partner. Has had a hysterectomy with BSO. Regular vision and dental exams. Having acid reflux averaging once a month usually depending on her diet. Has had increased stress at work lately. Some fatigue and generalized arthralgias.    Review of Systems  Constitutional: Positive for fatigue. Negative for fever, activity change and appetite change.  HENT: Negative for dental problem, ear pain, sinus pressure and sore throat.   Respiratory: Negative for cough, chest tightness, shortness of breath and wheezing.   Cardiovascular: Negative for chest pain.  Gastrointestinal: Negative for nausea, vomiting, abdominal pain, diarrhea, constipation and abdominal distention.  Genitourinary: Negative for dysuria, urgency, frequency, vaginal discharge, enuresis, difficulty urinating, genital sores and pelvic pain.  Musculoskeletal: Positive for back pain and arthralgias.       Bilateral knee pain.       Objective:   Physical Exam  Constitutional: She is oriented to person, place, and time. She appears well-developed. No distress.  HENT:  Right Ear: External ear normal.  Left Ear: External ear normal.  Mouth/Throat: Oropharynx is clear and moist.  Neck: Normal range of motion. Neck supple. No tracheal deviation present. No thyromegaly present.  Cardiovascular: Normal rate, regular rhythm and normal heart sounds.  Exam reveals no gallop.   No murmur heard. Pulmonary/Chest: Effort normal and breath sounds normal.  Abdominal: Soft. She exhibits no distension. There is no tenderness.  Genitourinary:  Defers GU exam and rectal exams.  Musculoskeletal: She exhibits  tenderness. She exhibits no edema.  Generalized tenderness noted in the lumbar paraspinal area. Does not radiate into extremities.  Lymphadenopathy:    She has no cervical adenopathy.  Neurological: She is alert and oriented to person, place, and time.  Skin: Skin is warm and dry. No rash noted.  Psychiatric: She has a normal mood and affect. Her behavior is normal.  Vitals reviewed. Breast exam: Minimal nodularity, no dominant masses. Axillae no adenopathy.        Assessment & Plan:  Well woman exam - Plan: Lipid panel, Hepatic function panel, Basic metabolic panel  Midline low back pain without sciatica  Arthralgia - Plan: Antinuclear Antib (ANA), Rheumatoid Factor  Other fatigue - Plan: CBC with Differential, TSH  Anxiety as acute reaction to exceptional stress  Meds ordered this encounter  Medications  . DISCONTD: HYDROcodone-acetaminophen (NORCO) 10-325 MG per tablet    Sig: Take 1 tablet by mouth every 8 (eight) hours as needed.    Dispense:  30 tablet    Refill:  0    Order Specific Question:  Supervising Provider    Answer:  Mikey Kirschner [[id number]]  . citalopram (CELEXA) 20 MG tablet    Sig: Take 1 tablet (20 mg total) by mouth daily.    Dispense:  30 tablet    Refill:  0    Order Specific Question:  Supervising Provider    Answer:  Mikey Kirschner [[id number]]   Limit anti-inflammatories due to vertical sleeve gastrectomy and occasional reflux. Hydrocodone for extreme pain. Ice/heat applications and stretching exercises. Discussed importance of stress reduction. Trial of Celexa start with half a pill daily at bedtime, increase to one pill if tolerated. DC  med and call if any problems. Encouraged regular activity as tolerated and daily vitamin D/calcium supplementation. Next physical in 1 year. Return in about 1 month (around 02/09/2014). Call back sooner if any problems.

## 2014-01-13 NOTE — Telephone Encounter (Signed)
I deleted results from in basket before commenting on labs. Please notify patient: 1. No anemia 2. LDL (bad) cholesterol still a little high at 139 (want this below 130); good cholesterol is 57 which helps 3. Liver and kidney functions normal 4. Sugar good at 96 5. Thyroid normal 6. ANA and RA tests for joint pain were negative  Recheck if arthralgias persist.

## 2014-01-13 NOTE — Telephone Encounter (Signed)
LMRC

## 2014-01-13 NOTE — Telephone Encounter (Signed)
Patient notified and verbalized understanding of the test results. No further questions. 

## 2014-02-09 ENCOUNTER — Ambulatory Visit: Payer: BC Managed Care – PPO | Admitting: Nurse Practitioner

## 2014-02-21 ENCOUNTER — Encounter: Payer: Self-pay | Admitting: Nurse Practitioner

## 2014-02-21 ENCOUNTER — Ambulatory Visit (INDEPENDENT_AMBULATORY_CARE_PROVIDER_SITE_OTHER): Payer: 59 | Admitting: Nurse Practitioner

## 2014-02-21 VITALS — BP 130/90 | Ht 62.25 in | Wt 184.0 lb

## 2014-02-21 DIAGNOSIS — M797 Fibromyalgia: Secondary | ICD-10-CM | POA: Insufficient documentation

## 2014-02-21 DIAGNOSIS — F4322 Adjustment disorder with anxiety: Secondary | ICD-10-CM

## 2014-02-21 MED ORDER — HYDROCODONE-ACETAMINOPHEN 10-325 MG PO TABS: 1.0000 | ORAL_TABLET | Freq: Three times a day (TID) | ORAL | Status: DC | PRN

## 2014-02-21 MED ORDER — GABAPENTIN 300 MG PO CAPS: 300.0000 mg | ORAL_CAPSULE | Freq: Two times a day (BID) | ORAL | Status: DC

## 2014-02-21 MED ORDER — CITALOPRAM HYDROBROMIDE 20 MG PO TABS: 20.0000 mg | ORAL_TABLET | Freq: Every day | ORAL | Status: DC

## 2014-02-23 ENCOUNTER — Encounter: Payer: Self-pay | Admitting: Nurse Practitioner

## 2014-02-23 DIAGNOSIS — F4322 Adjustment disorder with anxiety: Secondary | ICD-10-CM | POA: Insufficient documentation

## 2014-02-23 NOTE — Progress Notes (Signed)
Subjective:  Presents for recheck of anxiety and fibromyalgia. Doing well on Celexa. Anxiety improved. Flare up of fibromyalgia. Migratory generalized muscle aches. Limits her pain med to no more than 30 per month. Would like another non narcotic med to help with pain.  Objective:   BP 130/90 mmHg  Ht 5' 2.25" (1.581 m)  Wt 184 lb (83.462 kg)  BMI 33.39 kg/m2 NAD. Alert, oriented. Cheerful affect. Lungs clear. Heart RRR.   Assessment: Fibromyalgia  Adjustment disorder with anxious mood  Plan:  Meds ordered this encounter  Medications  . citalopram (CELEXA) 20 MG tablet    Sig: Take 1 tablet (20 mg total) by mouth daily.    Dispense:  90 tablet    Refill:  1    Order Specific Question:  Supervising Provider    Answer:  Mikey Kirschner [[id number]]  . gabapentin (NEURONTIN) 300 MG capsule    Sig: Take 1 capsule (300 mg total) by mouth 2 (two) times daily.    Dispense:  60 capsule    Refill:  2    Order Specific Question:  Supervising Provider    Answer:  Mikey Kirschner [[id number]]  . DISCONTD: HYDROcodone-acetaminophen (NORCO) 10-325 MG per tablet    Sig: Take 1 tablet by mouth every 8 (eight) hours as needed.    Dispense:  30 tablet    Refill:  0    Order Specific Question:  Supervising Provider    Answer:  Mikey Kirschner [[id number]]  . DISCONTD: HYDROcodone-acetaminophen (NORCO) 10-325 MG per tablet    Sig: Take 1 tablet by mouth every 8 (eight) hours as needed.    Dispense:  30 tablet    Refill:  0    May fill 30 days from 02/21/14    Order Specific Question:  Supervising Provider    Answer:  Mikey Kirschner [[id number]]  . HYDROcodone-acetaminophen (NORCO) 10-325 MG per tablet    Sig: Take 1 tablet by mouth every 8 (eight) hours as needed.    Dispense:  30 tablet    Refill:  0    May fill 60 days from 02/21/14    Order Specific Question:  Supervising Provider    Answer:  Mikey Kirschner [[id number]]   Trial of Neurontin. Start with BID dosing. May need to titrate dose depending on  response and possible side effects. DC med and contact us if any problems.  Discussed importance of exercise, adequate rest and stress reduction. Return in about 3 months (around 05/23/2014).

## 2014-05-09 ENCOUNTER — Other Ambulatory Visit: Payer: Self-pay | Admitting: *Deleted

## 2014-05-09 MED ORDER — CITALOPRAM HYDROBROMIDE 20 MG PO TABS: 20.0000 mg | ORAL_TABLET | Freq: Every day | ORAL | Status: DC

## 2014-05-09 MED ORDER — GABAPENTIN 300 MG PO CAPS: 300.0000 mg | ORAL_CAPSULE | Freq: Two times a day (BID) | ORAL | Status: DC

## 2014-05-09 MED ORDER — GABAPENTIN 300 MG PO CAPS
300.0000 mg | ORAL_CAPSULE | Freq: Two times a day (BID) | ORAL | Status: DC
Start: 2014-05-09 — End: 2014-05-09

## 2014-05-10 ENCOUNTER — Other Ambulatory Visit: Payer: Self-pay

## 2014-05-10 MED ORDER — GABAPENTIN 300 MG PO CAPS: 300.0000 mg | ORAL_CAPSULE | Freq: Two times a day (BID) | ORAL | Status: DC

## 2014-05-10 NOTE — Addendum Note (Signed)
Addended byCharolotte Capuchin D on: 05/10/2014 08:53 AM   Modules accepted: Orders

## 2014-05-10 NOTE — Addendum Note (Signed)
Addended by: Jesusita Oka on: 05/10/2014 08:04 AM   Modules accepted: Orders

## 2014-05-19 ENCOUNTER — Other Ambulatory Visit: Payer: Self-pay | Admitting: Specialist

## 2014-05-19 DIAGNOSIS — M545 Low back pain: Secondary | ICD-10-CM

## 2014-06-08 ENCOUNTER — Ambulatory Visit
Admission: RE | Admit: 2014-06-08 | Discharge: 2014-06-08 | Disposition: A | Discharge status: Home / Self Care | Payer: 59 | Source: Ambulatory Visit | Attending: Specialist | Admitting: Specialist

## 2014-06-08 DIAGNOSIS — M545 Low back pain: Secondary | ICD-10-CM

## 2014-06-08 MED ORDER — GADOBENATE DIMEGLUMINE 529 MG/ML IV SOLN
17.0000 mL | Freq: Once | INTRAVENOUS | Status: AC | PRN
  Administered 2014-06-08: 17 mL via INTRAVENOUS

## 2014-06-09 ENCOUNTER — Other Ambulatory Visit: Payer: Self-pay

## 2014-06-12 ENCOUNTER — Other Ambulatory Visit: Payer: Self-pay | Admitting: Family Medicine

## 2014-06-13 ENCOUNTER — Ambulatory Visit (INDEPENDENT_AMBULATORY_CARE_PROVIDER_SITE_OTHER): Payer: 59 | Admitting: Family Medicine

## 2014-06-13 ENCOUNTER — Encounter: Payer: Self-pay | Admitting: Family Medicine

## 2014-06-13 VITALS — BP 126/80 | Temp 98.6°F | Ht 62.25 in | Wt 183.0 lb

## 2014-06-13 DIAGNOSIS — M797 Fibromyalgia: Secondary | ICD-10-CM | POA: Diagnosis not present

## 2014-06-13 DIAGNOSIS — R197 Diarrhea, unspecified: Secondary | ICD-10-CM | POA: Diagnosis not present

## 2014-06-13 MED ORDER — HYDROCODONE-ACETAMINOPHEN 10-325 MG PO TABS: 1.0000 | ORAL_TABLET | Freq: Three times a day (TID) | ORAL | Status: DC | PRN

## 2014-06-13 MED ORDER — GABAPENTIN 300 MG PO CAPS: 300.0000 mg | ORAL_CAPSULE | Freq: Two times a day (BID) | ORAL | Status: DC

## 2014-06-13 MED ORDER — COLESTIPOL HCL 1 G PO TABS: 1.0000 g | ORAL_TABLET | Freq: Two times a day (BID) | ORAL | Status: DC

## 2014-06-13 NOTE — Progress Notes (Signed)
   Subjective:    Patient ID: Natalie Dodson, female    DOB: 04/29/1950, 64 y.o.   MRN: [id number] Patient arrives office for follow-up of several concerns   Diarrhea  This is a chronic problem. The current episode started more than 1 year ago. The problem has been unchanged. The stool consistency is described as watery. Nothing aggravates the symptoms. There are no known risk factors. Treatments tried: Immodium. The treatment provided no relief.   Patient states that she needs a refill on her gabapentin.   immod every other day four or fivr of the big pills   Often will have substantial flare of diarrhe, has had a thorough workup in the past via Dr. Fabienne Bruns office see prior notes. Colonoscopy in blood work did not shed light. 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  Ongoing challenges with fibromyalgia. Gabapentin definitely helps is.  Neuropathic pain. Due to see spinal surgeon shortly. Uses nighttime hydrocodone and requests refill.     Review of Systems  Gastrointestinal: Positive for diarrhea.   No chest pain no headache no weight loss    Objective:   Physical Exam  Alert active no acute distress H&T normal lungs clear heart regular in rhythm abdomen benign no discrete tenderness      Assessment & Plan:  Impression 1 chronic diarrhea discussed. Challenging. Potentially related to prior GI procedures #2 fibromyalgia stable. Patient needs meds #3 neuropathic pain discussed plan hydrocodone refilled. Gabapentin refilled. GI referral. Trial of colestipol 1 g twice a day when necessary. WSL

## 2014-06-22 ENCOUNTER — Encounter: Payer: Self-pay | Admitting: Physician Assistant

## 2014-07-05 ENCOUNTER — Other Ambulatory Visit: Payer: Self-pay | Admitting: Specialist

## 2014-07-05 DIAGNOSIS — M546 Pain in thoracic spine: Secondary | ICD-10-CM

## 2014-07-09 ENCOUNTER — Ambulatory Visit
Admission: RE | Admit: 2014-07-09 | Discharge: 2014-07-09 | Disposition: A | Discharge status: Home / Self Care | Payer: 59 | Source: Ambulatory Visit | Attending: Specialist | Admitting: Specialist

## 2014-07-09 DIAGNOSIS — M546 Pain in thoracic spine: Secondary | ICD-10-CM

## 2014-07-11 ENCOUNTER — Ambulatory Visit: Payer: 59 | Admitting: Physician Assistant

## 2014-07-15 ENCOUNTER — Other Ambulatory Visit: Payer: Self-pay | Admitting: Family Medicine

## 2014-08-04 ENCOUNTER — Ambulatory Visit (INDEPENDENT_AMBULATORY_CARE_PROVIDER_SITE_OTHER): Payer: Self-pay | Admitting: Neurology

## 2014-08-04 ENCOUNTER — Ambulatory Visit (INDEPENDENT_AMBULATORY_CARE_PROVIDER_SITE_OTHER): Payer: 59 | Admitting: Neurology

## 2014-08-04 DIAGNOSIS — M5417 Radiculopathy, lumbosacral region: Secondary | ICD-10-CM | POA: Diagnosis not present

## 2014-08-04 DIAGNOSIS — Z0289 Encounter for other administrative examinations: Secondary | ICD-10-CM

## 2014-08-05 DIAGNOSIS — M5417 Radiculopathy, lumbosacral region: Secondary | ICD-10-CM | POA: Insufficient documentation

## 2014-08-05 NOTE — Progress Notes (Signed)
  [facility]    Provider:  Dr Jaynee Eagles Referring Provider: Mikey Kirschner, MD Primary Care Physician:  Mickie Hillier, MD  CC:  radiculopathy  HPI:  Natalie Dodson is a 64 y.o. female here as a referral from Dr. Wolfgang Phoenix for right-sided radiculopathy.   Summary  Nerve conduction studies were performed on the bilateral lower extremities:  The bilateral Peroneal motor nerves showed normal conductions with normal F Wave latencies The bilateral Tibial motor nerves showed normal conductions with normal F Wave latencies The bilateral Sural sensory nerves were within normal limits The bilateral superficial peroneal sensory nerves were within normal limits Bilateral H Reflexes showed normal latencies The bilateral saphenous sensory nerves were within normal limits   EMG Needle study was performed on selected right lower extremity muscles:   The iliopsoas, adductor magnus, biceps femoris (long head), gluteus maximus and medius, Vastus Medialis, Anterior Tibialis, Medial Gastrocnemius, Extensor Hallucis Longus muscles were within normal limits. Could not evaluate paraspinal muscles of these are unreliable after surgery.  Conclusion: This is a surprisingly normal study given patient's clinical history.  No electrophysiologic evidence for peripheral polyneuropathy or radiculopathy. Clinical correlation recommended.    Sarina Ill, MD  [facility] [address] [place], [place] [address]  Phone [phone] Fax [phone]

## 2014-08-05 NOTE — Progress Notes (Signed)
See procedure note.

## 2014-08-05 NOTE — Procedures (Signed)
[facility]    Provider:  Dr Jaynee Eagles Referring Provider: Mikey Kirschner, MD Primary Care Physician:  Mickie Hillier, MD  CC:  radiculopathy  HPI:  Natalie Dodson is a 64 y.o. female here as a referral from Dr. Wolfgang Phoenix for right-sided radiculopathy.   Summary  Nerve conduction studies were performed on the bilateral lower extremities:  The bilateral Peroneal motor nerves showed normal conductions with normal F Wave latencies The bilateral Tibial motor nerves showed normal conductions with normal F Wave latencies The bilateral Sural sensory nerves were within normal limits The bilateral superficial peroneal sensory nerves were within normal limits Bilateral H Reflexes showed normal latencies The bilateral saphenous sensory nerves were within normal limits   EMG Needle study was performed on selected right lower extremity muscles:   The iliopsoas, adductor magnus, biceps femoris (long head), gluteus maximus and medius, Vastus Medialis, Anterior Tibialis, Medial Gastrocnemius, Extensor Hallucis Longus muscles were within normal limits. Could not evaluate paraspinals as these muscles are unreliable after surgery.  Conclusion: This is a surprisingly normal study given patient's clinical and surgical history.  No electrophysiologic evidence for peripheral polyneuropathy or radiculopathy. Clinical correlation recommended.    Sarina Ill, MD  [facility] [address] [place], [place] [address]  Phone [phone] Fax [phone]

## 2014-08-12 ENCOUNTER — Telehealth: Payer: Self-pay | Admitting: Family Medicine

## 2014-08-12 NOTE — Telephone Encounter (Signed)
Pt is requesting the letter for surgical clearance for her upcoming surgery to be signed and faxed back today by 5pm if possible. Letter was taken to the nurses station along with pt's paper chart.

## 2014-08-12 NOTE — Telephone Encounter (Signed)
Last seen 06/2013 for diarrhea

## 2014-08-12 NOTE — Telephone Encounter (Signed)
Letter signed and faxed. Pt notified.

## 2014-08-16 ENCOUNTER — Other Ambulatory Visit (HOSPITAL_COMMUNITY): Payer: Self-pay | Admitting: Specialist

## 2014-08-24 ENCOUNTER — Encounter (HOSPITAL_COMMUNITY): Payer: Self-pay

## 2014-08-24 ENCOUNTER — Encounter (HOSPITAL_COMMUNITY)
Admission: RE | Admit: 2014-08-24 | Discharge: 2014-08-24 | Disposition: A | Discharge status: Home / Self Care | Payer: 59 | Source: Ambulatory Visit | Attending: Specialist | Admitting: Specialist

## 2014-08-24 DIAGNOSIS — K219 Gastro-esophageal reflux disease without esophagitis: Secondary | ICD-10-CM | POA: Diagnosis not present

## 2014-08-24 DIAGNOSIS — Z6831 Body mass index (BMI) 31.0-31.9, adult: Secondary | ICD-10-CM | POA: Diagnosis not present

## 2014-08-24 DIAGNOSIS — M4806 Spinal stenosis, lumbar region: Secondary | ICD-10-CM | POA: Diagnosis not present

## 2014-08-24 DIAGNOSIS — Z881 Allergy status to other antibiotic agents status: Secondary | ICD-10-CM | POA: Diagnosis not present

## 2014-08-24 DIAGNOSIS — Z800 Family history of malignant neoplasm of digestive organs: Secondary | ICD-10-CM | POA: Diagnosis not present

## 2014-08-24 DIAGNOSIS — F419 Anxiety disorder, unspecified: Secondary | ICD-10-CM | POA: Diagnosis not present

## 2014-08-24 DIAGNOSIS — M797 Fibromyalgia: Secondary | ICD-10-CM | POA: Diagnosis not present

## 2014-08-24 DIAGNOSIS — Z8614 Personal history of Methicillin resistant Staphylococcus aureus infection: Secondary | ICD-10-CM | POA: Diagnosis not present

## 2014-08-24 HISTORY — DX: Noninfective gastroenteritis and colitis, unspecified: K52.9

## 2014-08-24 LAB — COMPREHENSIVE METABOLIC PANEL
ALT: 13 U/L — ABNORMAL LOW (ref 14–54)
AST: 20 U/L (ref 15–41)
Albumin: 3.5 g/dL (ref 3.5–5.0)
Alkaline Phosphatase: 83 U/L (ref 38–126)
Anion gap: 6 (ref 5–15)
BUN: 12 mg/dL (ref 6–20)
CHLORIDE: 107 mmol/L (ref 101–111)
CO2: 28 mmol/L (ref 22–32)
Calcium: 8.8 mg/dL — ABNORMAL LOW (ref 8.9–10.3)
Creatinine, Ser: 0.85 mg/dL (ref 0.44–1.00)
GFR calc non Af Amer: >60 mL/min (ref >60)
GLUCOSE: 80 mg/dL (ref 65–99)
POTASSIUM: 4.1 mmol/L (ref 3.5–5.1)
Sodium: 141 mmol/L (ref 135–145)
Total Bilirubin: 0.4 mg/dL (ref 0.3–1.2)
Total Protein: 6.7 g/dL (ref 6.5–8.1)

## 2014-08-24 LAB — CBC
HCT: 38 % (ref 36.0–46.0)
Hemoglobin: 12.8 g/dL (ref 12.0–15.0)
MCH: 29.6 pg (ref 26.0–34.0)
MCHC: 33.7 g/dL (ref 30.0–36.0)
MCV: 87.8 fL (ref 78.0–100.0)
PLATELETS: 266 10*3/uL (ref 150–400)
RBC: 4.33 MIL/uL (ref 3.87–5.11)
RDW: 13.2 % (ref 11.5–15.5)
WBC: 5.7 10*3/uL (ref 4.0–10.5)

## 2014-08-24 LAB — SURGICAL PCR SCREEN
MRSA, PCR: NEGATIVE
STAPHYLOCOCCUS AUREUS: POSITIVE — AB

## 2014-08-24 NOTE — Pre-Procedure Instructions (Signed)
Natalie Dodson  08/24/2014      [place], [address] [place] [address] Phone: [phone] Fax: [phone]  [place], [place] [address] Phone: [phone] Fax: [place], [place] [address] [place] [address] Phone: [phone] Fax: [phone]    Your procedure is scheduled on : June 24th, Friday   Report to [facility] Admitting at 10:30 AM  Call this number if you have problems the morning of surgery:  [phone]   Remember:  Do not eat food or drink liquids after midnight Thursday.  Take these medicines the morning of surgery with A SIP OF WATER : Celexa, Nexium, Gabapentin, Oxycodone   Do not wear jewelry, make-up or nail polish.  Do not wear lotions, powders, or perfumes.  You may NOT wear deodorant the day of surgery.  Do not shave underarms & legs 48 hours prior to surgery.     Do not bring valuables to the hospital.  [facility] is not responsible for any belongings or valuables.  Contacts, dentures or bridgework may not be worn into surgery.  Leave your suitcase in the car.  After surgery it may be brought to your room. For patients admitted to the hospital, discharge time will be determined by your treatment team.   Name and phone number of your driver:     Special instructions:  "Preparing for Surgery" instruction sheet.  Please read over the following fact sheets that you were given. Pain Booklet, Coughing and Deep Breathing, MRSA Information and Surgical Site Infection Prevention

## 2014-08-26 ENCOUNTER — Inpatient Hospital Stay (HOSPITAL_COMMUNITY): Payer: 59 | Admitting: Anesthesiology

## 2014-08-26 ENCOUNTER — Encounter (HOSPITAL_COMMUNITY): Payer: Self-pay | Admitting: Surgery

## 2014-08-26 ENCOUNTER — Observation Stay (HOSPITAL_COMMUNITY)
Admission: RE | Admit: 2014-08-26 | Discharge: 2014-08-27 | Disposition: A | Discharge status: Home / Self Care | Payer: 59 | Source: Ambulatory Visit | Attending: Specialist | Admitting: Specialist

## 2014-08-26 ENCOUNTER — Inpatient Hospital Stay (HOSPITAL_COMMUNITY): Payer: 59

## 2014-08-26 ENCOUNTER — Encounter (HOSPITAL_COMMUNITY)
Admission: RE | Disposition: A | Discharge status: Home / Self Care | Payer: 59 | Source: Ambulatory Visit | Attending: Specialist

## 2014-08-26 DIAGNOSIS — M4806 Spinal stenosis, lumbar region: Secondary | ICD-10-CM | POA: Diagnosis not present

## 2014-08-26 DIAGNOSIS — Z8614 Personal history of Methicillin resistant Staphylococcus aureus infection: Secondary | ICD-10-CM | POA: Insufficient documentation

## 2014-08-26 DIAGNOSIS — M797 Fibromyalgia: Secondary | ICD-10-CM | POA: Insufficient documentation

## 2014-08-26 DIAGNOSIS — Z6831 Body mass index (BMI) 31.0-31.9, adult: Secondary | ICD-10-CM | POA: Insufficient documentation

## 2014-08-26 DIAGNOSIS — K219 Gastro-esophageal reflux disease without esophagitis: Secondary | ICD-10-CM | POA: Insufficient documentation

## 2014-08-26 DIAGNOSIS — Z881 Allergy status to other antibiotic agents status: Secondary | ICD-10-CM | POA: Insufficient documentation

## 2014-08-26 DIAGNOSIS — Z800 Family history of malignant neoplasm of digestive organs: Secondary | ICD-10-CM | POA: Insufficient documentation

## 2014-08-26 DIAGNOSIS — Z419 Encounter for procedure for purposes other than remedying health state, unspecified: Secondary | ICD-10-CM

## 2014-08-26 DIAGNOSIS — F419 Anxiety disorder, unspecified: Secondary | ICD-10-CM | POA: Insufficient documentation

## 2014-08-26 DIAGNOSIS — M48062 Spinal stenosis, lumbar region with neurogenic claudication: Secondary | ICD-10-CM | POA: Diagnosis present

## 2014-08-26 HISTORY — PX: LUMBAR LAMINECTOMY/DECOMPRESSION MICRODISCECTOMY: SHX5026

## 2014-08-26 SURGERY — LUMBAR LAMINECTOMY/DECOMPRESSION MICRODISCECTOMY
Anesthesia: General | Site: Back

## 2014-08-26 MED ORDER — BUPIVACAINE LIPOSOME 1.3 % IJ SUSP
INTRAMUSCULAR | Status: DC | PRN
  Administered 2014-08-26: 22 mL

## 2014-08-26 MED ORDER — THROMBIN 20000 UNITS EX SOLR
CUTANEOUS | Status: AC
  Filled 2014-08-26: qty 20000

## 2014-08-26 MED ORDER — LACTATED RINGERS IV SOLN
INTRAVENOUS | Status: DC | PRN
  Administered 2014-08-26 (×3): via INTRAVENOUS

## 2014-08-26 MED ORDER — FLEET ENEMA 7-19 GM/118ML RE ENEM: 1.0000 | ENEMA | Freq: Once | RECTAL | Status: AC | PRN

## 2014-08-26 MED ORDER — KETOROLAC TROMETHAMINE 30 MG/ML IJ SOLN
30.0000 mg | Freq: Once | INTRAMUSCULAR | Status: AC
Start: 2014-08-26 — End: 2014-08-26
  Administered 2014-08-26: 30 mg via INTRAVENOUS
  Filled 2014-08-26 (×2): qty 1

## 2014-08-26 MED ORDER — MIDAZOLAM HCL 5 MG/5ML IJ SOLN
INTRAMUSCULAR | Status: DC | PRN
  Administered 2014-08-26: 2 mg via INTRAVENOUS

## 2014-08-26 MED ORDER — ONDANSETRON HCL 4 MG/2ML IJ SOLN: 4.0000 mg | INTRAMUSCULAR | Status: DC | PRN

## 2014-08-26 MED ORDER — SODIUM CHLORIDE 0.9 % IV SOLN: 250.0000 mL | INTRAVENOUS | Status: DC

## 2014-08-26 MED ORDER — DEXAMETHASONE SODIUM PHOSPHATE 4 MG/ML IJ SOLN
INTRAMUSCULAR | Status: DC | PRN
  Administered 2014-08-26: 8 mg via INTRAVENOUS

## 2014-08-26 MED ORDER — SODIUM CHLORIDE 0.9 % IJ SOLN: 3.0000 mL | INTRAMUSCULAR | Status: DC | PRN

## 2014-08-26 MED ORDER — MENTHOL 3 MG MT LOZG: 1.0000 | LOZENGE | OROMUCOSAL | Status: DC | PRN

## 2014-08-26 MED ORDER — DOCUSATE SODIUM 100 MG PO CAPS
100.0000 mg | ORAL_CAPSULE | Freq: Two times a day (BID) | ORAL | Status: DC
  Administered 2014-08-27: 100 mg via ORAL
  Filled 2014-08-26 (×2): qty 1

## 2014-08-26 MED ORDER — GABAPENTIN 300 MG PO CAPS
300.0000 mg | ORAL_CAPSULE | Freq: Two times a day (BID) | ORAL | Status: DC
  Administered 2014-08-26 – 2014-08-27 (×2): 300 mg via ORAL
  Filled 2014-08-26 (×2): qty 1

## 2014-08-26 MED ORDER — BISACODYL 5 MG PO TBEC: 5.0000 mg | DELAYED_RELEASE_TABLET | Freq: Every day | ORAL | Status: DC | PRN

## 2014-08-26 MED ORDER — CEFAZOLIN SODIUM-DEXTROSE 2-3 GM-% IV SOLR
INTRAVENOUS | Status: AC
  Filled 2014-08-26: qty 50

## 2014-08-26 MED ORDER — PROPOFOL 10 MG/ML IV BOLUS
INTRAVENOUS | Status: AC
  Filled 2014-08-26: qty 20

## 2014-08-26 MED ORDER — ROCURONIUM BROMIDE 50 MG/5ML IV SOLN
INTRAVENOUS | Status: AC
  Filled 2014-08-26: qty 1

## 2014-08-26 MED ORDER — LACTATED RINGERS IV SOLN: INTRAVENOUS | Status: DC

## 2014-08-26 MED ORDER — PHENYLEPHRINE HCL 10 MG/ML IJ SOLN: 10.0000 mg | INTRAVENOUS | Status: DC | PRN

## 2014-08-26 MED ORDER — SODIUM CHLORIDE 0.9 % IJ SOLN: 3.0000 mL | Freq: Two times a day (BID) | INTRAMUSCULAR | Status: DC

## 2014-08-26 MED ORDER — LIDOCAINE HCL (CARDIAC) 20 MG/ML IV SOLN
INTRAVENOUS | Status: AC
  Filled 2014-08-26: qty 5

## 2014-08-26 MED ORDER — GLYCOPYRROLATE 0.2 MG/ML IJ SOLN
INTRAMUSCULAR | Status: DC | PRN
  Administered 2014-08-26: 0.6 mg via INTRAVENOUS

## 2014-08-26 MED ORDER — CEFAZOLIN SODIUM-DEXTROSE 2-3 GM-% IV SOLR
2.0000 g | INTRAVENOUS | Status: AC
  Administered 2014-08-26: 2 g via INTRAVENOUS

## 2014-08-26 MED ORDER — OXYCODONE HCL 5 MG PO TABS: 15.0000 mg | ORAL_TABLET | ORAL | Status: DC | PRN

## 2014-08-26 MED ORDER — CEFAZOLIN SODIUM 1-5 GM-% IV SOLN
1.0000 g | Freq: Three times a day (TID) | INTRAVENOUS | Status: AC
  Administered 2014-08-26 – 2014-08-27 (×2): 1 g via INTRAVENOUS
  Filled 2014-08-26 (×2): qty 50

## 2014-08-26 MED ORDER — GLYCOPYRROLATE 0.2 MG/ML IJ SOLN
INTRAMUSCULAR | Status: AC
  Filled 2014-08-26: qty 3

## 2014-08-26 MED ORDER — METHOCARBAMOL 500 MG PO TABS: 500.0000 mg | ORAL_TABLET | Freq: Four times a day (QID) | ORAL | Status: DC | PRN

## 2014-08-26 MED ORDER — BUPIVACAINE HCL 0.5 % IJ SOLN
INTRAMUSCULAR | Status: DC | PRN
  Administered 2014-08-26: 22 mL

## 2014-08-26 MED ORDER — CHLORHEXIDINE GLUCONATE 4 % EX LIQD: 60.0000 mL | Freq: Once | CUTANEOUS | Status: DC

## 2014-08-26 MED ORDER — LACTATED RINGERS IV SOLN
INTRAVENOUS | Status: DC
  Administered 2014-08-27: 02:00:00 via INTRAVENOUS

## 2014-08-26 MED ORDER — ALBUMIN HUMAN 5 % IV SOLN
INTRAVENOUS | Status: DC | PRN
  Administered 2014-08-26: 16:00:00 via INTRAVENOUS

## 2014-08-26 MED ORDER — 0.9 % SODIUM CHLORIDE (POUR BTL) OPTIME
TOPICAL | Status: DC | PRN
  Administered 2014-08-26: 1000 mL

## 2014-08-26 MED ORDER — FENTANYL CITRATE (PF) 100 MCG/2ML IJ SOLN
INTRAMUSCULAR | Status: DC | PRN
  Administered 2014-08-26: 50 ug via INTRAVENOUS
  Administered 2014-08-26: 100 ug via INTRAVENOUS
  Administered 2014-08-26 (×2): 50 ug via INTRAVENOUS

## 2014-08-26 MED ORDER — METHOCARBAMOL 500 MG PO TABS
500.0000 mg | ORAL_TABLET | Freq: Four times a day (QID) | ORAL | Status: DC | PRN
  Administered 2014-08-26 – 2014-08-27 (×2): 500 mg via ORAL
  Filled 2014-08-26 (×2): qty 1

## 2014-08-26 MED ORDER — THROMBIN 20000 UNITS EX KIT
PACK | CUTANEOUS | Status: DC | PRN
  Administered 2014-08-26: 20000 [IU] via TOPICAL

## 2014-08-26 MED ORDER — HEMOSTATIC AGENTS (NO CHARGE) OPTIME
TOPICAL | Status: DC | PRN
  Administered 2014-08-26: 1 via TOPICAL

## 2014-08-26 MED ORDER — OXYCODONE HCL 15 MG PO TABS: 15.0000 mg | ORAL_TABLET | ORAL | Status: DC | PRN

## 2014-08-26 MED ORDER — MIDAZOLAM HCL 2 MG/2ML IJ SOLN
INTRAMUSCULAR | Status: AC
  Filled 2014-08-26: qty 2

## 2014-08-26 MED ORDER — NEOSTIGMINE METHYLSULFATE 10 MG/10ML IV SOLN
INTRAVENOUS | Status: AC
  Filled 2014-08-26: qty 3

## 2014-08-26 MED ORDER — FENTANYL CITRATE (PF) 250 MCG/5ML IJ SOLN
INTRAMUSCULAR | Status: AC
  Filled 2014-08-26: qty 5

## 2014-08-26 MED ORDER — CITALOPRAM HYDROBROMIDE 20 MG PO TABS
20.0000 mg | ORAL_TABLET | Freq: Every day | ORAL | Status: DC
  Administered 2014-08-27: 20 mg via ORAL
  Filled 2014-08-26: qty 1

## 2014-08-26 MED ORDER — BUPIVACAINE LIPOSOME 1.3 % IJ SUSP
20.0000 mL | INTRAMUSCULAR | Status: DC
Start: 2014-08-26 — End: 2014-08-26
  Filled 2014-08-26: qty 20

## 2014-08-26 MED ORDER — ALUM & MAG HYDROXIDE-SIMETH 200-200-20 MG/5ML PO SUSP: 30.0000 mL | Freq: Four times a day (QID) | ORAL | Status: DC | PRN

## 2014-08-26 MED ORDER — ACETAMINOPHEN 325 MG PO TABS: 650.0000 mg | ORAL_TABLET | ORAL | Status: DC | PRN

## 2014-08-26 MED ORDER — MORPHINE SULFATE 2 MG/ML IJ SOLN: 1.0000 mg | INTRAMUSCULAR | Status: DC | PRN

## 2014-08-26 MED ORDER — SCOPOLAMINE 1 MG/3DAYS TD PT72
1.0000 | MEDICATED_PATCH | TRANSDERMAL | Status: DC
  Administered 2014-08-26: 1 via TRANSDERMAL

## 2014-08-26 MED ORDER — PHENOL 1.4 % MT LIQD: 1.0000 | OROMUCOSAL | Status: DC | PRN

## 2014-08-26 MED ORDER — EPHEDRINE SULFATE 50 MG/ML IJ SOLN
INTRAMUSCULAR | Status: DC | PRN
  Administered 2014-08-26 (×6): 5 mg via INTRAVENOUS

## 2014-08-26 MED ORDER — SCOPOLAMINE 1 MG/3DAYS TD PT72
MEDICATED_PATCH | TRANSDERMAL | Status: AC
  Filled 2014-08-26: qty 1

## 2014-08-26 MED ORDER — THROMBIN 20000 UNITS EX KIT
PACK | CUTANEOUS | Status: AC
  Filled 2014-08-26: qty 1

## 2014-08-26 MED ORDER — ROCURONIUM BROMIDE 100 MG/10ML IV SOLN
INTRAVENOUS | Status: DC | PRN
  Administered 2014-08-26: 40 mg via INTRAVENOUS
  Administered 2014-08-26: 10 mg via INTRAVENOUS

## 2014-08-26 MED ORDER — PROPOFOL 10 MG/ML IV BOLUS
INTRAVENOUS | Status: DC | PRN
  Administered 2014-08-26: 150 mg via INTRAVENOUS

## 2014-08-26 MED ORDER — ONDANSETRON HCL 4 MG/2ML IJ SOLN
INTRAMUSCULAR | Status: DC | PRN
  Administered 2014-08-26: 4 mg via INTRAVENOUS

## 2014-08-26 MED ORDER — POLYETHYLENE GLYCOL 3350 17 G PO PACK
17.0000 g | PACK | Freq: Every day | ORAL | Status: DC | PRN
Start: 2014-08-26 — End: 2014-08-27

## 2014-08-26 MED ORDER — HYDROCODONE-ACETAMINOPHEN 5-325 MG PO TABS
1.0000 | ORAL_TABLET | ORAL | Status: DC | PRN
  Administered 2014-08-26 – 2014-08-27 (×4): 2 via ORAL
  Filled 2014-08-26 (×4): qty 2

## 2014-08-26 MED ORDER — PROMETHAZINE HCL 25 MG/ML IJ SOLN: 6.2500 mg | INTRAMUSCULAR | Status: DC | PRN

## 2014-08-26 MED ORDER — DIPHENHYDRAMINE HCL 50 MG/ML IJ SOLN
INTRAMUSCULAR | Status: DC | PRN
  Administered 2014-08-26: 10 mg via INTRAVENOUS

## 2014-08-26 MED ORDER — HYDROMORPHONE HCL 1 MG/ML IJ SOLN: 0.2500 mg | INTRAMUSCULAR | Status: DC | PRN

## 2014-08-26 MED ORDER — PHENYLEPHRINE HCL 10 MG/ML IJ SOLN
10.0000 mg | INTRAVENOUS | Status: DC | PRN
  Administered 2014-08-26: 20 ug/min via INTRAVENOUS

## 2014-08-26 MED ORDER — METHOCARBAMOL 1000 MG/10ML IJ SOLN
500.0000 mg | Freq: Four times a day (QID) | INTRAVENOUS | Status: DC | PRN
  Filled 2014-08-26: qty 5

## 2014-08-26 MED ORDER — ACETAMINOPHEN 650 MG RE SUPP: 650.0000 mg | RECTAL | Status: DC | PRN

## 2014-08-26 MED ORDER — PANTOPRAZOLE SODIUM 40 MG PO TBEC
80.0000 mg | DELAYED_RELEASE_TABLET | Freq: Every day | ORAL | Status: DC
  Administered 2014-08-27: 80 mg via ORAL
  Filled 2014-08-26: qty 2

## 2014-08-26 MED ORDER — NEOSTIGMINE METHYLSULFATE 10 MG/10ML IV SOLN
INTRAVENOUS | Status: DC | PRN
  Administered 2014-08-26: 4 mg via INTRAVENOUS

## 2014-08-26 MED ORDER — LIDOCAINE HCL (CARDIAC) 20 MG/ML IV SOLN
INTRAVENOUS | Status: DC | PRN
  Administered 2014-08-26: 50 mg via INTRAVENOUS

## 2014-08-26 SURGICAL SUPPLY — 60 items
ADH SKN CLS APL DERMABOND .7 (GAUZE/BANDAGES/DRESSINGS) ×1
BUR RND FLUTED 2.5 (BURR) IMPLANT
BUR SABER RD CUTTING 3.0 (BURR) ×2 IMPLANT
BUR SABER RD CUTTING 3.0MM (BURR) ×1
CANISTER SUCTION 2500CC (MISCELLANEOUS) ×3 IMPLANT
COVER MAYO STAND STRL (DRAPES) ×3 IMPLANT
COVER SURGICAL LIGHT HANDLE (MISCELLANEOUS) ×3 IMPLANT
DERMABOND ADVANCED (GAUZE/BANDAGES/DRESSINGS) ×2
DERMABOND ADVANCED .7 DNX12 (GAUZE/BANDAGES/DRESSINGS) ×1 IMPLANT
DRAPE INCISE IOBAN 66X45 STRL (DRAPES) ×6 IMPLANT
DRAPE MICROSCOPE LEICA (MISCELLANEOUS) ×3 IMPLANT
DRAPE PROXIMA HALF (DRAPES) IMPLANT
DRAPE SURG 17X23 STRL (DRAPES) ×12 IMPLANT
DRSG MEPILEX BORDER 4X4 (GAUZE/BANDAGES/DRESSINGS) ×3 IMPLANT
DRSG MEPILEX BORDER 4X8 (GAUZE/BANDAGES/DRESSINGS) IMPLANT
DURAPREP 26ML APPLICATOR (WOUND CARE) ×3 IMPLANT
ELECT BLADE 4.0 EZ CLEAN MEGAD (MISCELLANEOUS) ×3
ELECT REM PT RETURN 9FT ADLT (ELECTROSURGICAL) ×3
ELECTRODE BLDE 4.0 EZ CLN MEGD (MISCELLANEOUS) ×1 IMPLANT
ELECTRODE REM PT RTRN 9FT ADLT (ELECTROSURGICAL) ×1 IMPLANT
EVACUATOR 1/8 PVC DRAIN (DRAIN) IMPLANT
GLOVE BIO SURGEON STRL SZ 6.5 (GLOVE) ×8 IMPLANT
GLOVE BIO SURGEONS STRL SZ 6.5 (GLOVE) ×4
GLOVE BIOGEL PI IND STRL 6.5 (GLOVE) ×2 IMPLANT
GLOVE BIOGEL PI IND STRL 7.5 (GLOVE) ×2 IMPLANT
GLOVE BIOGEL PI IND STRL 8 (GLOVE) ×1 IMPLANT
GLOVE BIOGEL PI INDICATOR 6.5 (GLOVE) ×4
GLOVE BIOGEL PI INDICATOR 7.5 (GLOVE) ×4
GLOVE BIOGEL PI INDICATOR 8 (GLOVE) ×2
GLOVE ECLIPSE 9.0 STRL (GLOVE) ×3 IMPLANT
GLOVE ORTHO TXT STRL SZ7.5 (GLOVE) ×3 IMPLANT
GLOVE SURG 8.5 LATEX PF (GLOVE) ×3 IMPLANT
GLOVE SURG SS PI 6.5 STRL IVOR (GLOVE) ×9 IMPLANT
GOWN STRL REUS W/ TWL LRG LVL3 (GOWN DISPOSABLE) ×1 IMPLANT
GOWN STRL REUS W/TWL 2XL LVL3 (GOWN DISPOSABLE) ×6 IMPLANT
GOWN STRL REUS W/TWL LRG LVL3 (GOWN DISPOSABLE) ×3
KIT BASIN OR (CUSTOM PROCEDURE TRAY) ×3 IMPLANT
KIT ROOM TURNOVER OR (KITS) ×3 IMPLANT
NEEDLE SPNL 18GX3.5 QUINCKE PK (NEEDLE) ×6 IMPLANT
NS IRRIG 1000ML POUR BTL (IV SOLUTION) ×3 IMPLANT
PACK LAMINECTOMY ORTHO (CUSTOM PROCEDURE TRAY) ×3 IMPLANT
PAD ARMBOARD 7.5X6 YLW CONV (MISCELLANEOUS) ×6 IMPLANT
PATTIES SURGICAL .5 X.5 (GAUZE/BANDAGES/DRESSINGS) IMPLANT
PATTIES SURGICAL .75X.75 (GAUZE/BANDAGES/DRESSINGS) IMPLANT
PATTIES SURGICAL 1X1 (DISPOSABLE) IMPLANT
SPONGE LAP 4X18 X RAY DECT (DISPOSABLE) IMPLANT
SPONGE SURGIFOAM ABS GEL 100 (HEMOSTASIS) IMPLANT
SUT VIC AB 0 CT1 27 (SUTURE)
SUT VIC AB 0 CT1 27XBRD ANBCTR (SUTURE) IMPLANT
SUT VIC AB 1 CT1 27 (SUTURE)
SUT VIC AB 1 CT1 27XBRD ANBCTR (SUTURE) IMPLANT
SUT VIC AB 2-0 CT1 27 (SUTURE)
SUT VIC AB 2-0 CT1 TAPERPNT 27 (SUTURE) IMPLANT
SUT VIC AB 3-0 X1 27 (SUTURE) ×3 IMPLANT
SUT VICRYL 0 UR6 27IN ABS (SUTURE) ×3 IMPLANT
TOWEL OR 17X24 6PK STRL BLUE (TOWEL DISPOSABLE) ×3 IMPLANT
TOWEL OR 17X26 10 PK STRL BLUE (TOWEL DISPOSABLE) ×3 IMPLANT
TRAY FOLEY CATH 16FRSI W/METER (SET/KITS/TRAYS/PACK) IMPLANT
TRAY FOLEY W/METER SILVER 14FR (SET/KITS/TRAYS/PACK) ×3 IMPLANT
WATER STERILE IRR 1000ML POUR (IV SOLUTION) ×3 IMPLANT

## 2014-08-26 NOTE — Anesthesia Procedure Notes (Signed)
Procedure Name: Intubation Date/Time: 08/26/2014 3:42 PM Performed by: Jenne Campus Pre-anesthesia Checklist: Patient identified, Emergency Drugs available, Suction available, Patient being monitored and Timeout performed Patient Re-evaluated:Patient Re-evaluated prior to inductionOxygen Delivery Method: Circle system utilized Preoxygenation: Pre-oxygenation with 100% oxygen Intubation Type: IV induction Ventilation: Mask ventilation without difficulty Laryngoscope Size: Miller and 2 Grade View: Grade I Tube type: Oral Tube size: 7.0 mm Number of attempts: 1 Airway Equipment and Method: Stylet Placement Confirmation: ETT inserted through vocal cords under direct vision,  positive ETCO2,  CO2 detector and breath sounds checked- equal and bilateral Secured at: 21 cm Tube secured with: Tape Dental Injury: Teeth and Oropharynx as per pre-operative assessment

## 2014-08-26 NOTE — H&P (Signed)
Natalie Dodson is an 64 y.o. female.   HISTORY OF PRESENT ILLNESS:  Natalie Dodson returns today.  She has been experiencing severe pain radiating along the right anterolateral thigh to the knee and then below the knee to the anterior shin on the right side.  Occasional discomfort on the left side but mainly right-sided complaints.  She has had a history of previous decompression and fusion at the L5-S1 level for spondylolisthesis complicated by a local infection with MRSA, but this was nearly 15 years ago.  She has noted over the last half year increasing problems with her back, difficulties with standing and walking any distance.  The pain has been mostly severe into her right lower extremity such that she is concerned that she is unable to sit for a great length of time, unable to stand for any length of time, unable to really perform work, and she has been out of work now for the last month, month and a half.  She has been undergoing a workup for her back which has included an MRI scan of her thoracic spine that was relatively unremarkable for disk protrusion in the upper back.  She does, however, have an apparent small right paracentral protrusion without effect on the cord.  There is at the T10-11 level a right paracentral disk extrusion with ventral cord deformation present and some flattening of the patient's right cord at this level.  A small left foraminal stenotic spur at T10-11, bilateral foraminal stenosis with facet spurs at T2-3 and T3-4.  Advanced right foraminal narrowing at T3-4.  Her lumbar studies have demonstrated a right-sided L2-3 foraminal entrapment which would generally affect the right L2 nerve root.  There is also lateral recess stenosis in this area that could affect the right L3 root.  She has bilateral L3-4 foraminal narrowing that is mild with left-sided foraminal stenosis.  Crowding of the lateral recesses at both sides at L3-4.  At L4-5 she has biforaminal narrowing with loss of  perineural fat and mild nerve distortion.  Her pain pattern is L4 which would be in keeping with the foraminal area at L4-5.  She has most recently undergone EMG and nerve conduction studies to assess for whether there is a right-sided radiculopathy.  The study was done by [facility].  It has returned negative for any neurologic findings.   I discussed with Natalie Dodson the findings.  I have indicated to her that it may be that the condition that she has is intermittent  nerve compression and a problem of spinal stenosis as her studies are showing.  In that instance, EMG and nerve conduction studies may be entirely normal because the problem is generally a dynamic nerve compression associated with standing and walking but relieved by sitting.  In those instances, there may not be changes in EMG and nerve conduction studies.  The neurologist expressed some amount of dismay that the study was negative.  I certainly feel as though she has been experiencing pain that is consistent with a right L4 radiculopathy.   Past Medical History  Diagnosis Date  . MRSA (methicillin resistant staph aureus) culture positive 11-06-11    cultured from back surgery hardware-hardware removed  . PONV (postoperative nausea and vomiting) 11-06-11    nausea with anesthesia  . Arthritis   . GERD (gastroesophageal reflux disease)   . Fibromyalgia   . Chronic diarrhea     d/t lap band    Past Surgical History  Procedure Laterality Date  . Laparoscopic  gastric banding  12/12/08  . Appendectomy    . Hernia repair      umbilical  . Tonsillectomy    . Rotator cuff repair      bilateral  . Elbow surgery  11-06-11    bilateral "tendon release"  . Abdominal hysterectomy      then ovarian removal  . Back surgery      X6  . Cervical fusion    . Removal of laparoscopic gastric band  01/02/10    port removed prior to this surgery  . Carpal tunnel release      right [place]  . 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 Bilateral  oophorectomy    . Colonoscopy N/A 08/18/2012    RMR: Rectal polyp-removed. Colonic diverticulosis s/p segmental biopsies and stool sampling  . Vertical sleeve gastrectomy N/A     Family History  Problem Relation Age of Onset  . Colon cancer Neg Hx    Social History:  reports that she has never smoked. She does not have any smokeless tobacco history on file. She reports that she does not drink alcohol or use illicit drugs.  Allergies:  Allergies  Allergen Reactions  . Vancomycin Hives    Can spread all over the body.    No prescriptions prior to admission    Results for orders placed or performed during the hospital encounter of 08/24/14 (from the past 48 hour(s))  Surgical pcr screen     Status: Abnormal   Collection Time: 08/24/14  1:22 PM  Result Value Ref Range   MRSA, PCR NEGATIVE NEGATIVE   Staphylococcus aureus POSITIVE (A) NEGATIVE    Comment:        The Xpert SA Assay (FDA approved for NASAL specimens in patients over 23 years of age), is one component of a comprehensive surveillance program.  Test performance has been validated by [facility] for patients greater than or equal to 33 year old. It is not intended to diagnose infection nor to guide or monitor treatment.   CBC     Status: None   Collection Time: 08/24/14  1:26 PM  Result Value Ref Range   WBC 5.7 4.0 - 10.5 K/uL   RBC 4.33 3.87 - 5.11 MIL/uL   Hemoglobin 12.8 12.0 - 15.0 g/dL   HCT 38.0 36.0 - 46.0 %   MCV 87.8 78.0 - 100.0 fL   MCH 29.6 26.0 - 34.0 pg   MCHC 33.7 30.0 - 36.0 g/dL   RDW 13.2 11.5 - 15.5 %   Platelets 266 150 - 400 K/uL  Comprehensive metabolic panel     Status: Abnormal   Collection Time: 08/24/14  1:26 PM  Result Value Ref Range   Sodium 141 135 - 145 mmol/L   Potassium 4.1 3.5 - 5.1 mmol/L   Chloride 107 101 - 111 mmol/L   CO2 28 22 - 32 mmol/L   Glucose, Bld 80 65 - 99 mg/dL   BUN 12 6 - 20 mg/dL   Creatinine, Ser 0.85 0.44 - 1.00 mg/dL   Calcium 8.8 (L) 8.9 -  10.3 mg/dL   Total Protein 6.7 6.5 - 8.1 g/dL   Albumin 3.5 3.5 - 5.0 g/dL   AST 20 15 - 41 U/L   ALT 13 (L) 14 - 54 U/L   Alkaline Phosphatase 83 38 - 126 U/L   Total Bilirubin 0.4 0.3 - 1.2 mg/dL   GFR calc non Af Amer >60 >60 mL/min   GFR calc  Af Amer >60 >60 mL/min    Comment: (NOTE) The eGFR has been calculated using the CKD EPI equation. This calculation has not been validated in all clinical situations. eGFR's persistently <60 mL/min signify possible Chronic Kidney Disease.    Anion gap 6 5 - 15   No results found.  Review of Systems  Unable to perform ROS Constitutional: Negative for fever, chills, weight loss, malaise/fatigue and diaphoresis.  HENT: Negative for congestion, ear discharge, ear pain, hearing loss, nosebleeds, sore throat and tinnitus.   Eyes: Negative for blurred vision, double vision, photophobia, pain, discharge and redness.  Respiratory: Negative for cough, hemoptysis, sputum production, shortness of breath, wheezing and stridor.   Cardiovascular: Negative for chest pain, palpitations, orthopnea, claudication, leg swelling and PND.  Gastrointestinal: Positive for melena. Negative for heartburn, nausea, vomiting, abdominal pain, diarrhea, constipation and blood in stool.  Genitourinary: Positive for flank pain. Negative for dysuria, urgency, frequency and hematuria.  Musculoskeletal: Positive for back pain. Negative for myalgias, joint pain, falls and neck pain.  Skin: Negative.   Neurological: Positive for tingling, sensory change, focal weakness and weakness. Negative for dizziness, tremors, speech change, seizures, loss of consciousness and headaches.  Endo/Heme/Allergies: Negative for environmental allergies and polydipsia. Does not bruise/bleed easily.  Psychiatric/Behavioral: Negative for depression, suicidal ideas, hallucinations, memory loss and substance abuse. The patient is not nervous/anxious and does not have insomnia.     There were no vitals  taken for this visit. Physical Exam  Constitutional: She is oriented to person, place, and time. She appears well-developed and well-nourished. No distress.  HENT:  Head: Normocephalic and atraumatic.  Right Ear: External ear normal.  Left Ear: External ear normal.  Nose: Nose normal.  Mouth/Throat: Oropharynx is clear and moist. No oropharyngeal exudate.  Eyes: Conjunctivae and EOM are normal. Pupils are equal, round, and reactive to light. Right eye exhibits no discharge. Left eye exhibits no discharge. No scleral icterus.  Neck: Normal range of motion. Neck supple. No JVD present. No tracheal deviation present. No thyromegaly present.  Cardiovascular: Normal rate, regular rhythm, normal heart sounds and intact distal pulses.  Exam reveals no gallop and no friction rub.   No murmur heard. Respiratory: Effort normal and breath sounds normal. No stridor. No respiratory distress. She has no wheezes. She has no rales. She exhibits no tenderness.  GI: Soft. Bowel sounds are normal. She exhibits no distension and no mass. There is no tenderness. There is no rebound and no guarding.  Musculoskeletal: She exhibits tenderness. She exhibits no edema.  Lymphadenopathy:    She has no cervical adenopathy.  Neurological: She is alert and oriented to person, place, and time. She displays abnormal reflex. No cranial nerve deficit. She exhibits normal muscle tone. Coordination normal.  Skin: Skin is warm and dry. No rash noted. She is not diaphoretic. No erythema. No pallor.  Psychiatric: She has a normal mood and affect. Her behavior is normal. Judgment and thought content normal.      PHYSICAL EXAMINATION:  Clinically, her exam shows that she has a normal straight leg raise, both sides.  Popliteal compression sign is normal, both sides.  Foot dorsiflexion and plantarflexion strength is normal.  Knee extension and flexion strength normal.  Hip abduction/adduction and hip flexion testing all appear intact.    ASSESSMENT/DIAGNOSIS AND PLAN:  This patient is complaining of severe, persistent pain into her right thigh and anterior shin consistent with an upper lumbar radiculopathy on the right side, either L3 or L4.  That  being the case, my recommendations are for right-sided L2-3 hemilaminectomy for decompression of lateral recess stenosis of the right L3 nerve root with a decompression of the lateral recess as well as the L3 neural foramen.  The L3-4 level does show some foraminal stenosis on the left side, and complaints are of pain into both legs and both thighs as well so that a bilateral lateral recess decompression is recommended at this level with bilateral foraminotomies over the L4 nerve roots which would decompress the neural foramen on both sides and should help decrease the pain associated with foraminal narrowing, right and left sides affecting her L4 nerve roots.  She has, unfortunately, been out of work for some time now, and the last note I wrote was in late April.  Her pain complaints though have been since consistent and are now almost 3 months since the onset of increased discomfort here.  I feel as though she has failed management of this condition conservatively.  She has failed attempts at epidural steroids, finding that they only temporize the discomfort for 2 or 3 days, and then the pain seems to recur with epidural steroid having been done on 06/23/2014, her right-sided L2-3 ESI for subarticular lateral recess stenosis here.   I will renew Natalie Dodson's medicines, and she is advised to try a stationary bicycle.  Walking in a pool would also be helpful.  Her medicine of oxycodone 10 mg 1 every 4 to 6 hours p.r.n. pain.  I have discussed with Natalie Dodson the fact that she does not seem to be improving with our treatment thus far so that I am recommending that she go ahead and have decompression for a condition of lumbar spinal stenosis.  Even though EMG and nerve conduction studies have returned  negative, it is apparent that she is not seeing improvement with exercises, avoidance of prolonged standing and ambulation, or the use of narcotic medicines or oral steroids, or epidural steroids.  At this point I feel as though her condition relates primarily to the lower lumbar area.  There is an apparent protruded disk also at T11-12 that may also need our attention.  Return appointment after surgical intervention.    OWENS,JAMES M 08/26/2014, 6:11 AM

## 2014-08-26 NOTE — Interval H&P Note (Signed)
History and Physical Interval Note:  08/26/2014 2:10 PM  Natalie Dodson  has presented today for surgery, with the diagnosis of right L2-3 lateral recess stenosis, bilateral L3-4 stenosis, L4 foraminal stenosis  The various methods of treatment have been discussed with the patient and family. After consideration of risks, benefits and other options for treatment, the patient has consented to  Procedure(s): RIGHT L2-3 LATERAL RECESS DECOMPRESSION, BILATERAL L3-4 LATERAL RECESS DECOMPRESSION, BILATERAL L4 FORAMINOTOMY (N/A) as a surgical intervention .  The patient's history has been reviewed, patient examined, no change in status, stable for surgery.  I have reviewed the patient's chart and labs.  Questions were answered to the patient's satisfaction.     NITKA,JAMES E

## 2014-08-26 NOTE — Anesthesia Postprocedure Evaluation (Signed)
  Anesthesia Post-op Note  Patient: Natalie Dodson  Procedure(s) Performed: Procedure(s): RIGHT L2-3 LATERAL RECESS DECOMPRESSION, BILATERAL L3-4 LATERAL RECESS DECOMPRESSION, BILATERAL L4 FORAMINOTOMY (N/A)  Patient Location: PACU  Anesthesia Type:General  Level of Consciousness: awake and alert   Airway and Oxygen Therapy: Patient Spontanous Breathing  Post-op Pain: none  Post-op Assessment: Post-op Vital signs reviewed, Patient's Cardiovascular Status Stable and Respiratory Function Stable  Post-op Vital Signs: Reviewed  Filed Vitals:   08/26/14 1945  BP: 132/59  Pulse: 73  Temp:   Resp: 15    Complications: No apparent anesthesia complications

## 2014-08-26 NOTE — Brief Op Note (Signed)
08/26/2014  6:37 PM  PATIENT:  Natalie Dodson  64 y.o. female  PRE-OPERATIVE DIAGNOSIS:  right L2-3 lateral recess stenosis, bilateral L3-4 stenosis, L4 foraminal stenosis  POST-OPERATIVE DIAGNOSIS:  Right L2-3 LATERAL RECESS STENOSIS, BILATERAL L3-4 STENOSIS  PROCEDURE:  Procedure(s): RIGHT L2-3 LATERAL RECESS DECOMPRESSION, BILATERAL L3-4 LATERAL RECESS DECOMPRESSION, BILATERAL L4 FORAMINOTOMY (N/A)  SURGEON:  Surgeon(s) and Role:   Jessy Oto, MD - Primary  PHYSICIAN ASSISTANT: Benjiman Core, PA-C/ April RNFA.  ANESTHESIA:   local and general  EBL:  Total I/O In: 2750 [I.V.:2500; IV Piggyback:250] Out: 325 [Urine:225; Blood:100]  BLOOD ADMINISTERED:none  DRAINS: Urinary Catheter (Foley)   LOCAL MEDICATIONS USED:  MARCAINE 1/2%  1:1 EXPAREL 1.3% Amount:30 ml  SPECIMEN:  No Specimen  DISPOSITION OF SPECIMEN:  N/A  COUNTS:  YES  TOURNIQUET:  * No tourniquets in log *  DICTATION: .Dragon Dictation  PLAN OF CARE: Admit to inpatient   PATIENT DISPOSITION:  PACU - hemodynamically stable.   Delay start of Pharmacological VTE agent (>24hrs) due to surgical blood loss or risk of bleeding: yes

## 2014-08-26 NOTE — Transfer of Care (Signed)
Immediate Anesthesia Transfer of Care Note  Patient: CHRIS NARASIMHAN  Procedure(s) Performed: Procedure(s): RIGHT L2-3 LATERAL RECESS DECOMPRESSION, BILATERAL L3-4 LATERAL RECESS DECOMPRESSION, BILATERAL L4 FORAMINOTOMY (N/A)  Patient Location: PACU  Anesthesia Type:General  Level of Consciousness: awake and alert   Airway & Oxygen Therapy: Patient Spontanous Breathing and Patient connected to nasal cannula oxygen  Post-op Assessment: Report given to RN and Post -op Vital signs reviewed and stable  Post vital signs: Reviewed and stable  Last Vitals:  Filed Vitals:   08/26/14 1851  BP: 142/51  Pulse: 83  Temp: 36.7 C  Resp:     Complications: No apparent anesthesia complications

## 2014-08-26 NOTE — Progress Notes (Signed)
I will plan to see this patient in the morning and decide if she is ready to go home. Will need PT and OT prior to discharge.

## 2014-08-26 NOTE — Op Note (Signed)
08/26/2014  6:40 PM  PATIENT:  Natalie Dodson  64 y.o. 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 Oren Bracket.  EBL: 150cc.  DRAINS: Foley to SD.     COMPLICATIONS:  None.     PROCEDURE:The patient was met in the holding area, and the appropriate Right Lumbar level L2-3 and Bilateral L3-4 identified and marked with "x" and my initials.The patient was then transported to OR and was placed under general anesthesia without difficulty. The patient received appropriate preoperative antibiotic prophylaxis Ancef 2 g IV. Foley catheter was placed sterilely. The patient after intubation atraumatically was transferred to the operating room table, prone position, Wilson frame, sliding OR table. All pressure points were well padded. The arms in 90-90 well-padded at the elbows. Standard prep with DuraPrep solution lower dorsal spine to the mid sacral segment. Draped in the usual manner iodine Vi-Drape was used. Time-out procedure was called and correct. Skin at the expected L2-3 level was then infiltrated with marcaine 1/2% 1:1 exparel 1.3% total 15 cc used. An incision approximately 2-1/2 inches in length was then made through skin and subcutaneous layers in line with the right side of the expected midline just superior to the spinal needle entry point. An incision made into the right  lumbosacral fascia approximately 2 inches in length . Clamp placed on the spinous process and a lateral radiograph Demonstrated the clamp at the caudal aspect of the L2 spinous process. This level was marked with a single 2-0 Vicryl suture for continued identification throughout the procedure. The bilateral posterior aspect of the lamina of L3 and the L3-4 or space then does using electrocautery and Cobb elevators. The posterior aspect of the inner laminar area then exposed. Leksell rongeur used to remove small portions of soft tissue attached to the inferior aspect of the lamina of L3 bilaterally. Depth measured off of the Cobb elevators at about 70 mm and 70 mm retractors and placed on the scaffolding for the insight retractor and placed on the posterior aspect of the lamina at the expected L3-4 level. This was sterilely attached to the articulating arm and it's up right which had been attached the OR table sterilely. The operating room microscope sterilely draped brought into the field. Under the operating room microscope, the right L3-4 interspace carefully debrided the small amount of muscle attachment here and high-speed bur used to drill the medial aspect of the inferior articular process of L3 approximately 10%. Inferior lamina of L3 on the right was resected superiorly to the insertion site of the ligamentum flavum over its inferior ventral surface. Insertion site was then freed using a hockey-stick nerve probe. Ligamentum flavum was then carefully retracted with a nerve hook and a 3 mm Kerrison then used to remove the ligamentum flavum  debris the attachment off the medial aspect of the right L3-4 facet and the superior aspect of the lamina of L4. Foraminotomy was then performed over the right L4  nerve root. The medial 10% superior articular process of L4 and then resected using 2 mm Kerrison. This allowed for identification of the thecal sac. Carefully the lateral aspect of the L5 nerve root was  identified and a Penfield 4 was used to mobilize the nerve medially such that no herniated disc was found to be present with microscope. The ligamentum flavum was quite hypertrophic and appeared to be causing lateral recess stenosis primarily. Ligamentum flavum was debrided and lateral recess along the medial aspect L3-4 facet no further decompression was necessary. Ball tip nerve probe was then able to carefully palpate the neuroforamen for L4 and L3 finding these to be well decompressed. The operating room microscope then adjusted, the left L3-4 interspace carefully debrided the small amount of muscle attachment here and high-speed bur used to drill the medial aspect of the inferior articular process of L3 approximately 10%. Inferior lamina of L3 on the left was resected superiorly to the insertion site of the ligamentum flavum over its inferior ventral surface. Insertion site was then freed using a hockey-stick nerve probe. Ligamentum flavum was then carefully retracted with a nerve hook and a 3 mm Kerrison then used to remove the ligamentum flavum  debris the attachment off the medial aspect of the left L3-4 facet and the superior aspect of the lamina of L4. Foraminotomy was then performed over the left L4 nerve root. The medial 10% superior articular process of L4 and then resected using 2 mm Kerrison. This allowed for identification of the thecal sac. Carefully the lateral aspect of the L5 nerve root was identified and a Penfield 4 was used to mobilize the nerve medially such that no herniated disc was found to be present. The ligamentum flavum was quite hypertrophic and appeared to be causing lateral recess stenosis. Ligamentum flavum was debrided and lateral recess along the medial aspect left L3-4 facet no further decompression was necessary. Ball tip nerve probe was then able to carefully palpate the neuroforamen for L4 and L3 finding these to be well decompressed.Attention then turned to the right L2-3  level which was easily visualized with the microscope.The insight retractor moved to visualize the right L2-3 interlaminar area posteriorly. Soft tissues debrided about the posterior aspect of the L2-3 interspace. High-speed bur and then used to carefully drill inferior 3 or 4 mm of the right side L2 lamina debriding lamina superiorly to the area of attachment of the ligamentum flavum on the ventral inferior lamina surface.and on the medial aspect of the right L2 inferior articular process of 3 mm. The superior margin of the L3 lamina then carefully debrided with curette and a 2 mm kerrison then used to enter the spinal canal over the superior aspect of the L3 lamina resecting bone over the superior aspect and freeing up the attachment of ligamentum flavum here. Ligamentum flavum then debrided with the 2 mm and 3 mm Kerrisons we decompressed the L3 nerve root and the lateral recess right L2-3 decompressed using 2 and 3 mm Kerrisons sizing hypertrophic reflected ligamentum flavum extending superiorly. Further flavum was resected off the ventral aspect of the inferior margin of the L2 lamina. Hockey-stick nerve probe could then be passed out the L3 neuroforamen and the L2 neuroforamen. Venous bleeding encountered. Thrombin-soaked Gelfoam used to control this following this then the sac and the L3 nerve root were mobilized medially and the L2-3 disc examined and found not to be herniated. Again hypertrophic ligamentum flavum was found to be impressing on the right lateral recess  at the right L2-3 level. Irrigation was carried out down to this bleeding controlled with Gelfoam. Gelfoam was then removed. Irrigation carried careful examination demonstrated no active bleeding present. Retractors were then carefully removed The Bleeding was then controlled using thrombin-soaked Gelfoam and small cottonoids. Small amount of bleeding within the soft tissue mass the laminotomy area was controlled using bipolar electrocautery.  Irrigation was carried out using copious amounts of irrigant solution. All Gelfoam were then removed. No significant active bleeding present at the time of removal. All instruments sponge counts were correct traction system was then carefully removed with bipolar electrocautery of any small bleeders. Lumbodorsal fascia was then carefully approximated with interrupted 0 Vicryl sutures, UR 6 needle deep subcutaneous layers were approximated with interrupted 0 Vicryl sutures on UR 6 the appear subcutaneous layers approximated with interrupted 2-0 Vicryl sutures and the skin closed with a running subcutaneous stitch of 3-0 Vicryl. Dermabond was applied allowed to dry and then Mepilex bandage applied. Patient was then carefully returned to supine position on a stretcher, reactivated and extubated. She was then returned to recovery room in satisfactory condition.  Benjiman Core, PA-C perform the duties of assistant surgeon during this case. he was present from the beginning of the case to the end of the case assisting in transfer the patient from her stretcher to the OR table and back to the stretcher at the end of the case. Assisted in careful retraction and suction of the laminectomy site delicate neural structures operating under the operating room microscope.     NITKA,JAMES E  08/26/2014, 6:40 PM

## 2014-08-26 NOTE — Discharge Instructions (Addendum)
    No lifting greater than 10 lbs. Avoid bending, stooping and twisting. Walk in house for first week them may start to get out slowly increasing distance up to one mile by 3 weeks post op. Keep incision dry for 3 days, may use tegaderm or similar water impervious dressing.  

## 2014-08-26 NOTE — Anesthesia Preprocedure Evaluation (Addendum)
Anesthesia Evaluation  Patient identified by MRN, date of birth, ID band Patient awake    Reviewed: NPO status , Patient's Chart, lab work & pertinent test results  History of Anesthesia Complications (+) PONV and history of anesthetic complications  Airway Mallampati: II  TM Distance: >3 FB Neck ROM: Full    Dental  (+) Teeth Intact, Dental Advisory Given   Pulmonary neg pulmonary ROS,  breath sounds clear to auscultation  Pulmonary exam normal       Cardiovascular negative cardio ROS Normal cardiovascular examRhythm:Regular Rate:Normal     Neuro/Psych PSYCHIATRIC DISORDERS Anxiety    GI/Hepatic Neg liver ROS, GERD-  Controlled,  Endo/Other  Morbid obesity  Renal/GU negative Renal ROS     Musculoskeletal  (+) Arthritis -, Fibromyalgia -, narcotic dependent  Abdominal   Peds  Hematology   Anesthesia Other Findings   Reproductive/Obstetrics                           Anesthesia Physical Anesthesia Plan  ASA: II  Anesthesia Plan: General   Post-op Pain Management:    Induction: Intravenous  Airway Management Planned: Oral ETT  Additional Equipment:   Intra-op Plan:   Post-operative Plan: Extubation in OR  Informed Consent: I have reviewed the patients History and Physical, chart, labs and discussed the procedure including the risks, benefits and alternatives for the proposed anesthesia with the patient or authorized representative who has indicated his/her understanding and acceptance.   Dental advisory given  Plan Discussed with: CRNA, Anesthesiologist and Surgeon  Anesthesia Plan Comments:        Anesthesia Quick Evaluation

## 2014-08-27 DIAGNOSIS — M4806 Spinal stenosis, lumbar region: Secondary | ICD-10-CM | POA: Diagnosis not present

## 2014-08-27 MED ORDER — METHOCARBAMOL 500 MG PO TABS: 500.0000 mg | ORAL_TABLET | Freq: Four times a day (QID) | ORAL | Status: DC | PRN

## 2014-08-27 NOTE — Evaluation (Signed)
Physical Therapy Evaluation Patient Details Name: Natalie Dodson MRN: [id number] DOB: February 23, 1951 Today's Date: 08/27/2014   History of Present Illness  Pt underwent L2-4 spinal decompression 08-26-14.  Clinical Impression  Pt is mod I with bed mobility, transfers, and gait using RW. Supervision provided for stair training, but pt exhibits no difficulty. She will have 24-hour assist at home. No f/u PT services indicated. Pt needs RW for home. PT signing off.    Follow Up Recommendations No PT follow up;Supervision - Intermittent    Equipment Recommendations  Rolling walker with 5" wheels    Recommendations for Other Services       Precautions / Restrictions Precautions Precautions: Back Precaution Booklet Issued: Yes (comment) Precaution Comments: Handout provided. Educated pt on 3/3 precautions.      Mobility  Bed Mobility Overal bed mobility: Modified Independent             General bed mobility comments: Pt demo logroll with good technique.  Transfers Overall transfer level: Modified independent Equipment used: Ambulation equipment used                Ambulation/Gait Ambulation/Gait assistance: Modified independent (Device/Increase time) Ambulation Distance (Feet): 350 Feet Assistive device: Rolling walker (2 wheeled) Gait Pattern/deviations: Step-through pattern Gait velocity: mildly decreased      Stairs Stairs: Yes Stairs assistance: Supervision Stair Management: Two rails;Forwards;Alternating pattern Number of Stairs: 5    Wheelchair Mobility    Modified Rankin (Stroke Patients Only)       Balance                                             Pertinent Vitals/Pain Pain Assessment: No/denies pain    Home Living Family/patient expects to be discharged to:: Private residence Living Arrangements: Spouse/significant other;Children Available Help at Discharge: Family;Available 24 hours/day Type of Home: House Home  Access: Stairs to enter Entrance Stairs-Rails: Right;Left;Can reach both Entrance Stairs-Number of Steps: 3 Home Layout: One level Home Equipment: None      Prior Function Level of Independence: Independent               [place] Dominance        Extremity/Trunk Assessment                         Communication   Communication: No difficulties  Cognition Arousal/Alertness: Awake/alert Behavior During Therapy: WFL for tasks assessed/performed Overall Cognitive Status: Within Functional Limits for tasks assessed                      General Comments      Exercises        Assessment/Plan    PT Assessment Patent does not need any further PT services  PT Diagnosis Difficulty walking   PT Problem List    PT Treatment Interventions     PT Goals (Current goals can be found in the Care Plan section) Acute Rehab PT Goals Patient Stated Goal: home today PT Goal Formulation: All assessment and education complete, DC therapy    Frequency     Barriers to discharge        Co-evaluation               End of Session Equipment Utilized During Treatment: Gait belt Activity Tolerance: Patient tolerated treatment well Patient left: in bed;with  call bell/phone within reach Nurse Communication: Mobility status    Functional Assessment Tool Used: clinical judgement Functional Limitation: Mobility: Walking and moving around Mobility: Walking and Moving Around Current Status [phone]): At least 1 percent but less than 20 percent impaired, limited or restricted Mobility: Walking and Moving Around Goal Status [phone]): At least 1 percent but less than 20 percent impaired, limited or restricted Mobility: Walking and Moving Around Discharge Status [phone]): At least 1 percent but less than 20 percent impaired, limited or restricted    Time: 0851-0911 PT Time Calculation (min) (ACUTE ONLY): 20 min   Charges:   PT Evaluation $Initial PT Evaluation Tier I: 1  Procedure     PT G Codes:   PT G-Codes **NOT FOR INPATIENT CLASS** Functional Assessment Tool Used: clinical judgement Functional Limitation: Mobility: Walking and moving around Mobility: Walking and Moving Around Current Status ([id number]): At least 1 percent but less than 20 percent impaired, limited or restricted Mobility: Walking and Moving Around Goal Status [phone]): At least 1 percent but less than 20 percent impaired, limited or restricted Mobility: Walking and Moving Around Discharge Status [phone]): At least 1 percent but less than 20 percent impaired, limited or restricted    Lorriane Shire 08/27/2014, 9:15 AM

## 2014-08-27 NOTE — Progress Notes (Signed)
Patient discharged home with son. Patient already had prescriptions. Pain medication given before discharge. Discharge instructions given. Patient did not have any questions.

## 2014-08-27 NOTE — Progress Notes (Signed)
OT Cancellation Note  Patient Details Name: Natalie Dodson MRN: [id number] DOB: Sep 23, 1950   Cancelled Treatment:    Reason Eval/Treat Not Completed: OT screened, no needs identified, will sign off. Per PT, pt is Mod I with mobility and has hx of multiple back surgeries, will have 24 hour assist from family and has all necessary DME/AE  Britt Bottom 08/27/2014, 9:34 AM

## 2014-08-27 NOTE — Care Management Note (Signed)
Case Management Note  Patient Details  Name: Natalie Dodson MRN: [id number] Date of Birth: 07-22-1950  Subjective/Objective:                  RIGHT L2-3 LATERAL RECESS DECOMPRESSION, BILATERAL L3-4 LATERAL RECESS DECOMPRESSION, BILATERAL L4 FORAMINOTOMY  Action/Plan: CM spoke to patient about discharge planning. CM offered choice for Home DME and pt states that she prefer to use [facility] for Rolling walker. Pt states that she does not want to get the 3N1 as she does not feel it is necessary. Pt states that she will have her significant other at home to assist as well as her son. CM called and spoke to [place] with [facility] @ [phone] who accepted referral for Rolling walker. Pt denies any further discharge or home needs. No further CM needs communicated.  Expected Discharge Date:  08/27/14               Expected Discharge Plan:  Home/Self Care  In-House Referral:     Discharge planning Services  CM Consult  Post Acute Care Choice:  Durable Medical Equipment Choice offered to:  Patient  DME Arranged:  [facility] Rile rolling DME Agency:  [place]:    [place]:     Status of Service:  Completed, signed off  Medicare Important Message Given:    Date Medicare IM Given:    Medicare IM give by:    Date Additional Medicare IM Given:    Additional Medicare Important Message give by:     If discussed at [place] of Stay Meetings, dates discussed:    Additional Comments:  Guido Sander, RN 08/27/2014, 9:30 AM

## 2014-08-27 NOTE — Discharge Summary (Signed)
Physician Discharge Summary      Patient ID: Natalie Dodson MRN: [id number] DOB/AGE: June 11, 1950 64 y.o.  Admit date: 08/26/2014 Discharge date: 08/27/2014  Admission Diagnoses:  Active Problems:   Spinal stenosis, lumbar region, with neurogenic claudication   Discharge Diagnoses:  Same  Past Medical History  Diagnosis Date  . MRSA (methicillin resistant staph aureus) culture positive 11-06-11    cultured from back surgery hardware-hardware removed  . PONV (postoperative nausea and vomiting) 11-06-11    nausea with anesthesia  . Arthritis   . GERD (gastroesophageal reflux disease)   . Fibromyalgia   . Chronic diarrhea     d/t lap band    Surgeries: Procedure(s): RIGHT L2-3 LATERAL RECESS DECOMPRESSION, BILATERAL L3-4 LATERAL RECESS DECOMPRESSION, BILATERAL L4 FORAMINOTOMY on 08/26/2014   Consultants:    Discharged Condition: Improved  Hospital Course: Natalie Dodson is an 64 y.o. female who was admitted 08/26/2014 with a chief complaint of No chief complaint on file. , and found to have a diagnosis of <principal problem not specified>.  She was brought to the operating room on 08/26/2014 and underwent the above named procedures.    She was given perioperative antibiotics:  Anti-infectives    Start     Dose/Rate Route Frequency Ordered Stop   08/26/14 2100  ceFAZolin (ANCEF) IVPB 1 g/50 mL premix     1 g 100 mL/hr over 30 Minutes Intravenous Every 8 hours 08/26/14 2050 08/27/14 0614   08/26/14 1032  ceFAZolin (ANCEF) 2-3 GM-% IVPB SOLR    Comments:  Ara Kussmaul   : cabinet override      08/26/14 1032 08/26/14 2244   08/26/14 1026  ceFAZolin (ANCEF) IVPB 2 g/50 mL premix     2 g 100 mL/hr over 30 Minutes Intravenous On call to O.R. 08/26/14 1026 08/26/14 1544    Postoperative day #1 she was awake alert oriented 4 able to stand ambulate quite well. Her Foley catheter was discontinued and she was able to void spontaneously. She was seen by both physical therapy and  occupational therapy and felt to be independent for discharge home with a rolling walker. Home health nursing was considered for the patient postoperatively. Her incision was dry and showed no erythema or drainage. New dressing was applied. She is taking and tolerating by mouth narcotic medicines for relief of pain as well as by mouth nourishment. Her pain was relieved into her lower extremities. She was discharged home then on postoperative day #1 to be seen back in follow-up in 2 weeks from the time of her discharge. Discharge instructions were provided and all questions were answered prior to her discharge.  She was given sequential compression devices, early ambulation, and chemoprophylaxis for DVT prophylaxis.  She benefited maximally from their hospital stay and there were no complications.    Recent vital signs:  Filed Vitals:   08/27/14 0532  BP: 106/58  Pulse: 57  Temp: 98.4 F (36.9 C)  Resp: 17    Recent laboratory studies:  Results for orders placed or performed during the hospital encounter of 08/24/14  Surgical pcr screen  Result Value Ref Range   MRSA, PCR NEGATIVE NEGATIVE   Staphylococcus aureus POSITIVE (A) NEGATIVE  CBC  Result Value Ref Range   WBC 5.7 4.0 - 10.5 K/uL   RBC 4.33 3.87 - 5.11 MIL/uL   Hemoglobin 12.8 12.0 - 15.0 g/dL   HCT 38.0 36.0 - 46.0 %   MCV 87.8 78.0 - 100.0 fL   MCH 29.6  26.0 - 34.0 pg   MCHC 33.7 30.0 - 36.0 g/dL   RDW 13.2 11.5 - 15.5 %   Platelets 266 150 - 400 K/uL  Comprehensive metabolic panel  Result Value Ref Range   Sodium 141 135 - 145 mmol/L   Potassium 4.1 3.5 - 5.1 mmol/L   Chloride 107 101 - 111 mmol/L   CO2 28 22 - 32 mmol/L   Glucose, Bld 80 65 - 99 mg/dL   BUN 12 6 - 20 mg/dL   Creatinine, Ser 0.85 0.44 - 1.00 mg/dL   Calcium 8.8 (L) 8.9 - 10.3 mg/dL   Total Protein 6.7 6.5 - 8.1 g/dL   Albumin 3.5 3.5 - 5.0 g/dL   AST 20 15 - 41 U/L   ALT 13 (L) 14 - 54 U/L   Alkaline Phosphatase 83 38 - 126 U/L   Total  Bilirubin 0.4 0.3 - 1.2 mg/dL   GFR calc non Af Amer >60 >60 mL/min   GFR calc Af Amer >60 >60 mL/min   Anion gap 6 5 - 15    Discharge Medications:     Medication List    TAKE these medications        citalopram 20 MG tablet  Commonly known as:  CELEXA  TAKE 1 TABLET DAILY     esomeprazole 40 MG capsule  Commonly known as:  NEXIUM  Take 40 mg by mouth 2 (two) times daily as needed (for acid reflux).     gabapentin 300 MG capsule  Commonly known as:  NEURONTIN  Take 1 capsule (300 mg total) by mouth 2 (two) times daily.     methocarbamol 500 MG tablet  Commonly known as:  ROBAXIN  Take 1 tablet (500 mg total) by mouth every 6 (six) hours as needed for muscle spasms.     Oxycodone HCl 10 MG Tabs  Take 10 mg by mouth every 6 (six) hours as needed (for pain).     oxyCODONE 15 MG immediate release tablet  Commonly known as:  ROXICODONE  Take 1 tablet (15 mg total) by mouth every 4 (four) hours as needed for severe pain.        Diagnostic Studies: Dg Lumbar Spine 2-3 Views  08/26/2014   CLINICAL DATA:  Lumbar decompression.  EXAM: LUMBAR SPINE - 2-3 VIEW  COMPARISON:  MRI 06/08/2014  FINDINGS: Vertebral body alignment and heights are within normal. There is moderate spondylosis throughout the lumbar spine. There is multilevel disc space narrowing most prominent at the L2-3 level with sparing of the L4-5 level. In intervertebral disc spacer is present at the L5-S1 level. Evidence of previous laminectomy over the lower lumbar spine. No compression fracture or subluxation. Subsequent image demonstrates a surgical instrument with tip over the posterior soft tissues at the L2-3 level.  IMPRESSION: Model spondylosis of the lumbar spine with multilevel disc disease worse at the L2-3 level. Surgical instrument over the posterior soft tissues at the L2-3 level.   Electronically Signed   By: Marin Olp M.D.   On: 08/26/2014 18:57    Disposition: 01-Home or Self Care      Discharge  Instructions    Call MD / Call 911    Complete by:  As directed   If you experience chest pain or shortness of breath, CALL 911 and be transported to the hospital emergency room.  If you develope a fever above 101 F, pus (white drainage) or increased drainage or redness at the wound, or calf pain,  call your surgeon's office.     Constipation Prevention    Complete by:  As directed   Drink plenty of fluids.  Prune juice may be helpful.  You may use a stool softener, such as Colace (over the counter) 100 mg twice a day.  Use MiraLax (over the counter) for constipation as needed.     Diet - low sodium heart healthy    Complete by:  As directed      Discharge instructions    Complete by:  As directed   No lifting greater than 10 lbs. Avoid bending, stooping and twisting. Walk in house for first week them may start to get out slowly increasing distance up to one half mile by 3 weeks post op. Keep incision dry for 3 days, may use tegaderm or similar water impervious dressing.     Driving restrictions    Complete by:  As directed   No driving for 3 weeks     Increase activity slowly as tolerated    Complete by:  As directed      Lifting restrictions    Complete by:  As directed   No lifting for 6 weeks           Follow-up Information    Follow up with NITKA,JAMES E, MD In 2 weeks.   Specialty:  Orthopedic Surgery   Contact information:   [address] [place] [address] [phone]        Signed: Jessy Oto 08/27/2014, 3:14 PM

## 2014-08-27 NOTE — Plan of Care (Signed)
Problem: Consults Goal: Diagnosis - Spinal Surgery Outcome: Completed/Met Date Met:  08/27/14 L2-4 decompression

## 2014-08-27 NOTE — Progress Notes (Signed)
Subjective: 1 Day Post-Op Procedure(s) (LRB): RIGHT L2-3 LATERAL RECESS DECOMPRESSION, BILATERAL L3-4 LATERAL RECESS DECOMPRESSION, BILATERAL L4 FORAMINOTOMY (N/A) Patient reports pain as mild.  Reports doing well and wanting to go home today. Reports she has been up with PT in hallway walking without pain.  Objective: Vital signs in last 24 hours: Temp:  [97.7 F (36.5 C)-98.4 F (36.9 C)] 98.4 F (36.9 C) (06/25 0532) Pulse Rate:  [57-83] 57 (06/25 0532) Resp:  [12-18] 17 (06/25 0532) BP: (106-142)/(51-65) 106/58 mmHg (06/25 0532) SpO2:  [93 %-100 %] 96 % (06/25 0532) Weight:  [80.74 kg (178 lb)] 80.74 kg (178 lb) (06/24 1025)  Intake/Output from previous day: 06/24 0701 - 06/25 0700 In: 2750 [I.V.:2500; IV Piggyback:250] Out: 750 [Urine:650; Blood:100] Intake/Output this shift:     Recent Labs  08/24/14 1326  HGB 12.8    Recent Labs  08/24/14 1326  WBC 5.7  RBC 4.33  HCT 38.0  PLT 266    Recent Labs  08/24/14 1326  NA 141  K 4.1  CL 107  CO2 28  BUN 12  CREATININE 0.85  GLUCOSE 80  CALCIUM 8.8*   No results for input(s): LABPT, INR in the last 72 hours.  Sensation intact distally Intact pulses distally Dorsiflexion/Plantar flexion intact Incision: no drainage Compartment soft  Assessment/Plan: 1 Day Post-Op Procedure(s) (LRB): RIGHT L2-3 LATERAL RECESS DECOMPRESSION, BILATERAL L3-4 LATERAL RECESS DECOMPRESSION, BILATERAL L4 FORAMINOTOMY (N/A) Discharge home later today after patient has been seen by Dr. Louanne Skye, and PT/OT.  [place]ett 08/27/2014, 8:10 AM

## 2014-08-29 ENCOUNTER — Encounter (HOSPITAL_COMMUNITY): Payer: Self-pay | Admitting: Specialist

## 2014-08-29 MED FILL — Thrombin For Soln 20000 Unit: CUTANEOUS | Qty: 1 | Status: AC

## 2014-10-06 ENCOUNTER — Encounter: Payer: Self-pay | Admitting: Gastroenterology

## 2014-10-07 ENCOUNTER — Other Ambulatory Visit: Payer: Self-pay | Admitting: Nurse Practitioner

## 2014-10-13 ENCOUNTER — Other Ambulatory Visit: Payer: Self-pay | Admitting: Family Medicine

## 2014-10-13 NOTE — Telephone Encounter (Signed)
This and need ov

## 2014-11-14 ENCOUNTER — Other Ambulatory Visit: Payer: Self-pay | Admitting: Specialist

## 2014-11-14 DIAGNOSIS — M542 Cervicalgia: Secondary | ICD-10-CM

## 2014-11-21 ENCOUNTER — Ambulatory Visit
Admission: RE | Admit: 2014-11-21 | Discharge: 2014-11-21 | Disposition: A | Discharge status: Home / Self Care | Payer: 59 | Source: Ambulatory Visit | Attending: Specialist | Admitting: Specialist

## 2014-11-21 DIAGNOSIS — M542 Cervicalgia: Secondary | ICD-10-CM

## 2014-11-26 ENCOUNTER — Other Ambulatory Visit: Payer: 59

## 2014-11-28 ENCOUNTER — Telehealth: Payer: Self-pay | Admitting: Family Medicine

## 2014-11-28 NOTE — Telephone Encounter (Signed)
Pt is scheduled for surgery on 10/3, needs surgical clearance filled out Before then, no appts available. Can she be put on a same day in the next  Three days?

## 2014-11-28 NOTE — Telephone Encounter (Signed)
Publishing copy, will make an appt

## 2014-11-28 NOTE — Telephone Encounter (Signed)
si senora

## 2014-11-30 ENCOUNTER — Ambulatory Visit: Payer: 59 | Admitting: Gastroenterology

## 2014-11-30 ENCOUNTER — Encounter: Payer: Self-pay | Admitting: Family Medicine

## 2014-11-30 ENCOUNTER — Ambulatory Visit (INDEPENDENT_AMBULATORY_CARE_PROVIDER_SITE_OTHER): Payer: 59 | Admitting: Family Medicine

## 2014-11-30 VITALS — BP 126/74 | Ht 63.0 in | Wt 171.0 lb

## 2014-11-30 DIAGNOSIS — M797 Fibromyalgia: Secondary | ICD-10-CM

## 2014-11-30 DIAGNOSIS — Z23 Encounter for immunization: Secondary | ICD-10-CM | POA: Diagnosis not present

## 2014-11-30 MED ORDER — CITALOPRAM HYDROBROMIDE 20 MG PO TABS: 20.0000 mg | ORAL_TABLET | Freq: Every day | ORAL | Status: DC

## 2014-11-30 MED ORDER — GABAPENTIN 300 MG PO CAPS: 300.0000 mg | ORAL_CAPSULE | Freq: Two times a day (BID) | ORAL | Status: DC

## 2014-11-30 NOTE — Progress Notes (Signed)
   Subjective:    Patient ID: Natalie Dodson, female    DOB: 11-17-1950, 64 y.o.   MRN: [id number]  HPIpt arrives today for surgical clearance. Pt's surgery scheduled for oct 3rd.   No other concerns today.   Flu vaccine today.   Pt due for cerv spine surg multi level next  No recent chest pain. No excessive shortness of breath. Patient does exercise some. Utilize Nexium for reflux  Depression clinically stable  Review of Systems No headache no chest pain positive neuropathic pain cervical spine    Objective:   Physical Exam Alert vitals stable blood pressure good HEENT normal lungs clear. Heart regular in rhythm. Ankles without edema       Assessment & Plan:  Impression cervical spinal fusion candidate in need of surgery and medically able to face it with normal risks. Discussed. Form filled out WSL

## 2014-12-02 ENCOUNTER — Encounter (HOSPITAL_COMMUNITY)
Admission: RE | Admit: 2014-12-02 | Discharge: 2014-12-02 | Disposition: A | Discharge status: Home / Self Care | Payer: 59 | Source: Ambulatory Visit | Attending: Specialist | Admitting: Specialist

## 2014-12-02 ENCOUNTER — Encounter (HOSPITAL_COMMUNITY): Payer: Self-pay

## 2014-12-02 DIAGNOSIS — M50321 Other cervical disc degeneration at C4-C5 level: Secondary | ICD-10-CM | POA: Diagnosis not present

## 2014-12-02 DIAGNOSIS — M5031 Other cervical disc degeneration,  high cervical region: Secondary | ICD-10-CM | POA: Diagnosis present

## 2014-12-02 DIAGNOSIS — M4802 Spinal stenosis, cervical region: Secondary | ICD-10-CM | POA: Diagnosis not present

## 2014-12-02 DIAGNOSIS — M50322 Other cervical disc degeneration at C5-C6 level: Secondary | ICD-10-CM | POA: Diagnosis not present

## 2014-12-02 DIAGNOSIS — Z8614 Personal history of Methicillin resistant Staphylococcus aureus infection: Secondary | ICD-10-CM | POA: Diagnosis not present

## 2014-12-02 LAB — COMPREHENSIVE METABOLIC PANEL
ALT: 11 U/L — AB (ref 14–54)
AST: 21 U/L (ref 15–41)
Albumin: 3.6 g/dL (ref 3.5–5.0)
Alkaline Phosphatase: 79 U/L (ref 38–126)
Anion gap: 7 (ref 5–15)
BUN: 7 mg/dL (ref 6–20)
CALCIUM: 9 mg/dL (ref 8.9–10.3)
CO2: 27 mmol/L (ref 22–32)
CREATININE: 0.82 mg/dL (ref 0.44–1.00)
Chloride: 108 mmol/L (ref 101–111)
GFR calc Af Amer: >60 mL/min (ref >60)
GFR calc non Af Amer: >60 mL/min (ref >60)
GLUCOSE: 90 mg/dL (ref 65–99)
Potassium: 3.9 mmol/L (ref 3.5–5.1)
SODIUM: 142 mmol/L (ref 135–145)
Total Bilirubin: 0.7 mg/dL (ref 0.3–1.2)
Total Protein: 6.3 g/dL — ABNORMAL LOW (ref 6.5–8.1)

## 2014-12-02 LAB — SURGICAL PCR SCREEN
MRSA, PCR: NEGATIVE
Staphylococcus aureus: POSITIVE — AB

## 2014-12-02 LAB — CBC
HCT: 36.7 % (ref 36.0–46.0)
HEMOGLOBIN: 12 g/dL (ref 12.0–15.0)
MCH: 28.6 pg (ref 26.0–34.0)
MCHC: 32.7 g/dL (ref 30.0–36.0)
MCV: 87.4 fL (ref 78.0–100.0)
Platelets: 208 10*3/uL (ref 150–400)
RBC: 4.2 MIL/uL (ref 3.87–5.11)
RDW: 13.9 % (ref 11.5–15.5)
WBC: 6.2 10*3/uL (ref 4.0–10.5)

## 2014-12-02 NOTE — Progress Notes (Signed)
PCP - Dr. Baltazar Apo Cardiologist - denies  EKG/CXR - pt. Denies Echo/Stress Test/Cardiac Cath - pt. Denies  Patient denies shortness of breath and chest pain at PAT appointment.

## 2014-12-02 NOTE — Pre-Procedure Instructions (Signed)
    Natalie Dodson  12/02/2014      [facility] - [place], [address] [place] [address] Phone: [phone] Fax: [phone]  [place], [place] [address] Phone: [phone] Fax: [phone]  [facility] [place], [place] [address] [place] [address] Phone: [phone] Fax: [phone]    Your procedure is scheduled on Monday, October 3rd, 2016.  Report to [facility] Admitting at 5:30 A.M.  Call this number if you have problems the morning of surgery:  [phone]   Remember:  Do not eat food or drink liquids after midnight.   Take these medicines the morning of surgery with A SIP OF WATER: Citalopram (Celexa), Esomeprazole (Nexium), Gabapentin (Neurontin), Methocarbamol (Robaxin), Oxycodone (Roxicodone).    Do not wear jewelry, make-up or nail polish.  Do not wear lotions, powders, or perfumes.  You may NOT wear deodorant.  Do not shave 48 hours prior to surgery.   Do not bring valuables to the hospital.  [facility] is not responsible for any belongings or valuables.  Contacts, dentures or bridgework may not be worn into surgery.  Leave your suitcase in the car.  After surgery it may be brought to your room.  For patients admitted to the hospital, discharge time will be determined by your treatment team.  Patients discharged the day of surgery will not be allowed to drive home.   Special instructions:  See attached.   Please read over the following fact sheets that you were given. Pain Booklet, Coughing and Deep Breathing, MRSA Information and Surgical Site Infection Prevention

## 2014-12-04 MED ORDER — CEFAZOLIN SODIUM-DEXTROSE 2-3 GM-% IV SOLR
2.0000 g | INTRAVENOUS | Status: AC
  Administered 2014-12-05: 2 g via INTRAVENOUS
  Filled 2014-12-04: qty 50

## 2014-12-05 ENCOUNTER — Encounter (HOSPITAL_COMMUNITY): Payer: Self-pay | Admitting: Critical Care Medicine

## 2014-12-05 ENCOUNTER — Inpatient Hospital Stay (HOSPITAL_COMMUNITY): Payer: 59

## 2014-12-05 ENCOUNTER — Observation Stay (HOSPITAL_COMMUNITY)
Admission: RE | Admit: 2014-12-05 | Discharge: 2014-12-06 | Disposition: A | Discharge status: Home / Self Care | Payer: 59 | Source: Ambulatory Visit | Attending: Specialist | Admitting: Specialist

## 2014-12-05 ENCOUNTER — Inpatient Hospital Stay (HOSPITAL_COMMUNITY): Payer: 59 | Admitting: Anesthesiology

## 2014-12-05 ENCOUNTER — Encounter (HOSPITAL_COMMUNITY)
Admission: RE | Disposition: A | Discharge status: Home / Self Care | Payer: 59 | Source: Ambulatory Visit | Attending: Specialist

## 2014-12-05 DIAGNOSIS — Z8614 Personal history of Methicillin resistant Staphylococcus aureus infection: Secondary | ICD-10-CM | POA: Insufficient documentation

## 2014-12-05 DIAGNOSIS — M5031 Other cervical disc degeneration,  high cervical region: Principal | ICD-10-CM | POA: Insufficient documentation

## 2014-12-05 DIAGNOSIS — Z419 Encounter for procedure for purposes other than remedying health state, unspecified: Secondary | ICD-10-CM

## 2014-12-05 DIAGNOSIS — M50321 Other cervical disc degeneration at C4-C5 level: Secondary | ICD-10-CM | POA: Insufficient documentation

## 2014-12-05 DIAGNOSIS — M4802 Spinal stenosis, cervical region: Secondary | ICD-10-CM | POA: Diagnosis present

## 2014-12-05 DIAGNOSIS — M50322 Other cervical disc degeneration at C5-C6 level: Secondary | ICD-10-CM | POA: Insufficient documentation

## 2014-12-05 HISTORY — PX: ANTERIOR CERVICAL DECOMP/DISCECTOMY FUSION: SHX1161

## 2014-12-05 LAB — TYPE AND SCREEN
ABO/RH(D): A POS
Antibody Screen: NEGATIVE

## 2014-12-05 LAB — ABO/RH: ABO/RH(D): A POS

## 2014-12-05 SURGERY — ANTERIOR CERVICAL DECOMPRESSION/DISCECTOMY FUSION 2 LEVELS
Anesthesia: General | Site: Neck

## 2014-12-05 MED ORDER — ONDANSETRON HCL 4 MG/2ML IJ SOLN: 4.0000 mg | INTRAMUSCULAR | Status: DC | PRN

## 2014-12-05 MED ORDER — SODIUM CHLORIDE 0.9 % IJ SOLN
INTRAMUSCULAR | Status: AC
  Filled 2014-12-05: qty 10

## 2014-12-05 MED ORDER — SUCCINYLCHOLINE CHLORIDE 20 MG/ML IJ SOLN
INTRAMUSCULAR | Status: AC
  Filled 2014-12-05: qty 1

## 2014-12-05 MED ORDER — MEPERIDINE HCL 25 MG/ML IJ SOLN: 6.2500 mg | INTRAMUSCULAR | Status: DC | PRN

## 2014-12-05 MED ORDER — BISACODYL 5 MG PO TBEC: 5.0000 mg | DELAYED_RELEASE_TABLET | Freq: Every day | ORAL | Status: DC | PRN

## 2014-12-05 MED ORDER — HEMOSTATIC AGENTS (NO CHARGE) OPTIME
TOPICAL | Status: DC | PRN
  Administered 2014-12-05: 1 via TOPICAL

## 2014-12-05 MED ORDER — SODIUM CHLORIDE 0.9 % IV SOLN: Freq: Once | INTRAVENOUS | Status: DC

## 2014-12-05 MED ORDER — THROMBIN 20000 UNITS EX KIT
PACK | CUTANEOUS | Status: DC | PRN
  Administered 2014-12-05: 20 mL via TOPICAL

## 2014-12-05 MED ORDER — HYDROMORPHONE HCL 1 MG/ML IJ SOLN: 0.5000 mg | INTRAMUSCULAR | Status: DC | PRN

## 2014-12-05 MED ORDER — HYDROMORPHONE HCL 1 MG/ML IJ SOLN
INTRAMUSCULAR | Status: AC
  Filled 2014-12-05: qty 1

## 2014-12-05 MED ORDER — DEXTROSE 5 % IV SOLN
500.0000 mg | Freq: Four times a day (QID) | INTRAVENOUS | Status: DC | PRN
  Filled 2014-12-05: qty 5

## 2014-12-05 MED ORDER — BUPIVACAINE LIPOSOME 1.3 % IJ SUSP
INTRAMUSCULAR | Status: DC | PRN
  Administered 2014-12-05: 6 mL

## 2014-12-05 MED ORDER — 0.9 % SODIUM CHLORIDE (POUR BTL) OPTIME
TOPICAL | Status: DC | PRN
  Administered 2014-12-05: 1000 mL

## 2014-12-05 MED ORDER — METHOCARBAMOL 500 MG PO TABS
500.0000 mg | ORAL_TABLET | Freq: Four times a day (QID) | ORAL | Status: DC | PRN
  Filled 2014-12-05: qty 1

## 2014-12-05 MED ORDER — DOCUSATE SODIUM 100 MG PO CAPS
100.0000 mg | ORAL_CAPSULE | Freq: Two times a day (BID) | ORAL | Status: DC
  Administered 2014-12-05 – 2014-12-06 (×2): 100 mg via ORAL
  Filled 2014-12-05 (×3): qty 1

## 2014-12-05 MED ORDER — CHLORHEXIDINE GLUCONATE 4 % EX LIQD: 60.0000 mL | Freq: Once | CUTANEOUS | Status: DC

## 2014-12-05 MED ORDER — FENTANYL CITRATE (PF) 250 MCG/5ML IJ SOLN
INTRAMUSCULAR | Status: AC
  Filled 2014-12-05: qty 5

## 2014-12-05 MED ORDER — MUPIROCIN 2 % EX OINT
TOPICAL_OINTMENT | CUTANEOUS | Status: AC
  Filled 2014-12-05: qty 22

## 2014-12-05 MED ORDER — ACETAMINOPHEN 325 MG PO TABS: 650.0000 mg | ORAL_TABLET | ORAL | Status: DC | PRN

## 2014-12-05 MED ORDER — EPHEDRINE SULFATE 50 MG/ML IJ SOLN
INTRAMUSCULAR | Status: DC | PRN
  Administered 2014-12-05: 10 mg via INTRAVENOUS
  Administered 2014-12-05 (×2): 5 mg via INTRAVENOUS

## 2014-12-05 MED ORDER — ONDANSETRON HCL 4 MG/2ML IJ SOLN
INTRAMUSCULAR | Status: DC | PRN
  Administered 2014-12-05: 4 mg via INTRAVENOUS

## 2014-12-05 MED ORDER — ARTIFICIAL TEARS OP OINT
TOPICAL_OINTMENT | OPHTHALMIC | Status: DC | PRN
  Administered 2014-12-05: 1 via OPHTHALMIC

## 2014-12-05 MED ORDER — KETOROLAC TROMETHAMINE 30 MG/ML IJ SOLN
30.0000 mg | Freq: Once | INTRAMUSCULAR | Status: AC
  Administered 2014-12-05: 30 mg via INTRAVENOUS
  Filled 2014-12-05: qty 1

## 2014-12-05 MED ORDER — ONDANSETRON HCL 4 MG/2ML IJ SOLN
INTRAMUSCULAR | Status: AC
  Filled 2014-12-05: qty 2

## 2014-12-05 MED ORDER — HYDROCODONE-ACETAMINOPHEN 5-325 MG PO TABS
1.0000 | ORAL_TABLET | ORAL | Status: DC | PRN
  Filled 2014-12-05: qty 2

## 2014-12-05 MED ORDER — HYDROMORPHONE HCL 1 MG/ML IJ SOLN
0.2500 mg | INTRAMUSCULAR | Status: DC | PRN
  Administered 2014-12-05 (×2): 0.5 mg via INTRAVENOUS

## 2014-12-05 MED ORDER — KETOROLAC TROMETHAMINE 30 MG/ML IJ SOLN
INTRAMUSCULAR | Status: AC
  Filled 2014-12-05: qty 1

## 2014-12-05 MED ORDER — POTASSIUM CHLORIDE IN NACL 20-0.9 MEQ/L-% IV SOLN
INTRAVENOUS | Status: DC
  Administered 2014-12-05: 15:00:00 via INTRAVENOUS
  Filled 2014-12-05 (×2): qty 1000

## 2014-12-05 MED ORDER — POLYETHYLENE GLYCOL 3350 17 G PO PACK: 17.0000 g | PACK | Freq: Every day | ORAL | Status: DC | PRN

## 2014-12-05 MED ORDER — THROMBIN 20000 UNITS EX KIT
PACK | CUTANEOUS | Status: DC | PRN
Start: 2014-12-05 — End: 2014-12-05

## 2014-12-05 MED ORDER — ARTIFICIAL TEARS OP OINT
TOPICAL_OINTMENT | OPHTHALMIC | Status: AC
  Filled 2014-12-05: qty 3.5

## 2014-12-05 MED ORDER — PROPOFOL 10 MG/ML IV BOLUS
INTRAVENOUS | Status: DC | PRN
  Administered 2014-12-05: 200 mg via INTRAVENOUS

## 2014-12-05 MED ORDER — ZOLPIDEM TARTRATE 5 MG PO TABS
5.0000 mg | ORAL_TABLET | Freq: Every evening | ORAL | Status: DC | PRN
  Administered 2014-12-05: 5 mg via ORAL
  Filled 2014-12-05: qty 1

## 2014-12-05 MED ORDER — CEFAZOLIN SODIUM 1-5 GM-% IV SOLN
1.0000 g | Freq: Three times a day (TID) | INTRAVENOUS | Status: AC
  Administered 2014-12-05 – 2014-12-06 (×2): 1 g via INTRAVENOUS
  Filled 2014-12-05 (×2): qty 50

## 2014-12-05 MED ORDER — DEXAMETHASONE SODIUM PHOSPHATE 10 MG/ML IJ SOLN
INTRAMUSCULAR | Status: AC
  Filled 2014-12-05: qty 1

## 2014-12-05 MED ORDER — FENTANYL CITRATE (PF) 100 MCG/2ML IJ SOLN
INTRAMUSCULAR | Status: DC | PRN
  Administered 2014-12-05 (×9): 50 ug via INTRAVENOUS

## 2014-12-05 MED ORDER — SODIUM CHLORIDE 0.9 % IJ SOLN: 3.0000 mL | INTRAMUSCULAR | Status: DC | PRN

## 2014-12-05 MED ORDER — SODIUM CHLORIDE 0.9 % IV SOLN: 250.0000 mL | INTRAVENOUS | Status: DC

## 2014-12-05 MED ORDER — EPHEDRINE SULFATE 50 MG/ML IJ SOLN
INTRAMUSCULAR | Status: AC
  Filled 2014-12-05: qty 1

## 2014-12-05 MED ORDER — PROPOFOL 10 MG/ML IV BOLUS
INTRAVENOUS | Status: AC
  Filled 2014-12-05: qty 20

## 2014-12-05 MED ORDER — ROCURONIUM BROMIDE 50 MG/5ML IV SOLN
INTRAVENOUS | Status: AC
  Filled 2014-12-05: qty 1

## 2014-12-05 MED ORDER — ACETAMINOPHEN 650 MG RE SUPP: 650.0000 mg | RECTAL | Status: DC | PRN

## 2014-12-05 MED ORDER — OXYCODONE-ACETAMINOPHEN 5-325 MG PO TABS
1.0000 | ORAL_TABLET | ORAL | Status: DC | PRN
  Filled 2014-12-05: qty 2

## 2014-12-05 MED ORDER — GABAPENTIN 300 MG PO CAPS
300.0000 mg | ORAL_CAPSULE | Freq: Two times a day (BID) | ORAL | Status: DC
  Administered 2014-12-05 – 2014-12-06 (×3): 300 mg via ORAL
  Filled 2014-12-05 (×3): qty 1

## 2014-12-05 MED ORDER — LACTATED RINGERS IV SOLN
INTRAVENOUS | Status: DC | PRN
  Administered 2014-12-05: 08:00:00 via INTRAVENOUS

## 2014-12-05 MED ORDER — MIDAZOLAM HCL 2 MG/2ML IJ SOLN
INTRAMUSCULAR | Status: AC
  Filled 2014-12-05: qty 4

## 2014-12-05 MED ORDER — MIDAZOLAM HCL 5 MG/5ML IJ SOLN
INTRAMUSCULAR | Status: DC | PRN
  Administered 2014-12-05: 2 mg via INTRAVENOUS

## 2014-12-05 MED ORDER — BUPIVACAINE HCL (PF) 0.5 % IJ SOLN
INTRAMUSCULAR | Status: AC
  Filled 2014-12-05: qty 30

## 2014-12-05 MED ORDER — MUPIROCIN 2 % EX OINT
TOPICAL_OINTMENT | Freq: Two times a day (BID) | CUTANEOUS | Status: DC
  Administered 2014-12-05: 22:00:00 via NASAL
  Administered 2014-12-05: 1 via NASAL

## 2014-12-05 MED ORDER — GLYCOPYRROLATE 0.2 MG/ML IJ SOLN
INTRAMUSCULAR | Status: DC | PRN
  Administered 2014-12-05: 0.4 mg via INTRAVENOUS

## 2014-12-05 MED ORDER — SCOPOLAMINE 1 MG/3DAYS TD PT72
MEDICATED_PATCH | TRANSDERMAL | Status: DC | PRN
  Administered 2014-12-05: 1 via TRANSDERMAL

## 2014-12-05 MED ORDER — MENTHOL 3 MG MT LOZG: 1.0000 | LOZENGE | OROMUCOSAL | Status: DC | PRN

## 2014-12-05 MED ORDER — PANTOPRAZOLE SODIUM 40 MG PO TBEC: 80.0000 mg | DELAYED_RELEASE_TABLET | Freq: Every day | ORAL | Status: DC

## 2014-12-05 MED ORDER — LACTATED RINGERS IV SOLN
INTRAVENOUS | Status: DC | PRN
  Administered 2014-12-05 (×2): via INTRAVENOUS

## 2014-12-05 MED ORDER — DEXAMETHASONE SODIUM PHOSPHATE 10 MG/ML IJ SOLN
INTRAMUSCULAR | Status: DC | PRN
  Administered 2014-12-05: 8 mg via INTRAVENOUS

## 2014-12-05 MED ORDER — ROCURONIUM BROMIDE 100 MG/10ML IV SOLN
INTRAVENOUS | Status: DC | PRN
  Administered 2014-12-05: 20 mg via INTRAVENOUS
  Administered 2014-12-05: 50 mg via INTRAVENOUS

## 2014-12-05 MED ORDER — LIDOCAINE HCL (CARDIAC) 20 MG/ML IV SOLN
INTRAVENOUS | Status: DC | PRN
  Administered 2014-12-05: 50 mg via INTRAVENOUS

## 2014-12-05 MED ORDER — BUPIVACAINE HCL 0.5 % IJ SOLN
INTRAMUSCULAR | Status: DC | PRN
  Administered 2014-12-05: 6 mL

## 2014-12-05 MED ORDER — LIDOCAINE HCL (CARDIAC) 20 MG/ML IV SOLN
INTRAVENOUS | Status: AC
  Filled 2014-12-05: qty 5

## 2014-12-05 MED ORDER — SODIUM CHLORIDE 0.9 % IJ SOLN: 3.0000 mL | Freq: Two times a day (BID) | INTRAMUSCULAR | Status: DC

## 2014-12-05 MED ORDER — GLYCOPYRROLATE 0.2 MG/ML IJ SOLN
INTRAMUSCULAR | Status: AC
  Filled 2014-12-05: qty 2

## 2014-12-05 MED ORDER — PROMETHAZINE HCL 25 MG/ML IJ SOLN: 6.2500 mg | INTRAMUSCULAR | Status: DC | PRN

## 2014-12-05 MED ORDER — PHENYLEPHRINE HCL 10 MG/ML IJ SOLN
10.0000 mg | INTRAVENOUS | Status: DC | PRN
  Administered 2014-12-05: 10 ug/min via INTRAVENOUS

## 2014-12-05 MED ORDER — NEOSTIGMINE METHYLSULFATE 10 MG/10ML IV SOLN
INTRAVENOUS | Status: AC
  Filled 2014-12-05: qty 1

## 2014-12-05 MED ORDER — THROMBIN 20000 UNITS EX SOLR
CUTANEOUS | Status: AC
  Filled 2014-12-05: qty 20000

## 2014-12-05 MED ORDER — ALUM & MAG HYDROXIDE-SIMETH 200-200-20 MG/5ML PO SUSP: 30.0000 mL | Freq: Four times a day (QID) | ORAL | Status: DC | PRN

## 2014-12-05 MED ORDER — NEOSTIGMINE METHYLSULFATE 10 MG/10ML IV SOLN
INTRAVENOUS | Status: DC | PRN
  Administered 2014-12-05: 3 mg via INTRAVENOUS

## 2014-12-05 MED ORDER — BUPIVACAINE LIPOSOME 1.3 % IJ SUSP
20.0000 mL | INTRAMUSCULAR | Status: DC
  Filled 2014-12-05: qty 20

## 2014-12-05 MED ORDER — CITALOPRAM HYDROBROMIDE 10 MG PO TABS
20.0000 mg | ORAL_TABLET | Freq: Every day | ORAL | Status: DC
  Administered 2014-12-05 – 2014-12-06 (×2): 20 mg via ORAL
  Filled 2014-12-05 (×2): qty 2

## 2014-12-05 MED ORDER — PANTOPRAZOLE SODIUM 40 MG IV SOLR
40.0000 mg | Freq: Every day | INTRAVENOUS | Status: DC
  Administered 2014-12-05: 40 mg via INTRAVENOUS
  Filled 2014-12-05: qty 40

## 2014-12-05 MED ORDER — PHENOL 1.4 % MT LIQD: 1.0000 | OROMUCOSAL | Status: DC | PRN

## 2014-12-05 SURGICAL SUPPLY — 73 items
ADH SKN CLS APL DERMABOND .7 (GAUZE/BANDAGES/DRESSINGS) ×1
BAG DECANTER FOR FLEXI CONT (MISCELLANEOUS) ×3 IMPLANT
BIT DRILL SRG 14X2.2XFLT CHK (BIT) ×1 IMPLANT
BIT DRL SRG 14X2.2XFLT CHK (BIT) ×1
BLADE AVERAGE 25MMX9MM (BLADE)
BLADE AVERAGE 25X9 (BLADE) IMPLANT
BLADE SURG ROTATE 9660 (MISCELLANEOUS) ×3 IMPLANT
BONE MATRIX VIVIGEN 1CC (Bone Implant) ×6 IMPLANT
BUR MATCHSTICK NEURO 3.0 LAGG (BURR) ×3 IMPLANT
BUR RND FLUTED 2.5 (BURR) IMPLANT
BUR SABER RD CUTTING 3.0 (BURR) IMPLANT
BUR SABER RD CUTTING 3.0MM (BURR)
CANISTER SUCTION 2500CC (MISCELLANEOUS) ×3 IMPLANT
CLOSURE WOUND 1/2 X4 (GAUZE/BANDAGES/DRESSINGS) ×1
COVER MAYO STAND STRL (DRAPES) ×3 IMPLANT
COVER SURGICAL LIGHT HANDLE (MISCELLANEOUS) ×3 IMPLANT
DERMABOND ADVANCED (GAUZE/BANDAGES/DRESSINGS) ×2
DERMABOND ADVANCED .7 DNX12 (GAUZE/BANDAGES/DRESSINGS) ×1 IMPLANT
DRAIN TLS ROUND 10FR (DRAIN) IMPLANT
DRAPE C-ARM 42X72 X-RAY (DRAPES) ×3 IMPLANT
DRAPE MICROSCOPE LEICA (MISCELLANEOUS) ×3 IMPLANT
DRAPE POUCH INSTRU U-SHP 10X18 (DRAPES) ×3 IMPLANT
DRAPE SURG 17X23 STRL (DRAPES) ×12 IMPLANT
DRILL BIT SKYLINE 14MM (BIT) ×3
DRSG MEPILEX BORDER 4X4 (GAUZE/BANDAGES/DRESSINGS) ×3 IMPLANT
DURAPREP 6ML APPLICATOR 50/CS (WOUND CARE) ×3 IMPLANT
ELECT BLADE 4.0 EZ CLEAN MEGAD (MISCELLANEOUS)
ELECT COATED BLADE 2.86 ST (ELECTRODE) ×3 IMPLANT
ELECT REM PT RETURN 9FT ADLT (ELECTROSURGICAL) ×3
ELECTRODE BLDE 4.0 EZ CLN MEGD (MISCELLANEOUS) IMPLANT
ELECTRODE REM PT RTRN 9FT ADLT (ELECTROSURGICAL) ×1 IMPLANT
GAUZE SPONGE 4X4 12PLY STRL (GAUZE/BANDAGES/DRESSINGS) ×3 IMPLANT
GLOVE BIOGEL PI IND STRL 8 (GLOVE) ×1 IMPLANT
GLOVE BIOGEL PI INDICATOR 8 (GLOVE) ×2
GLOVE ECLIPSE 9.0 STRL (GLOVE) ×3 IMPLANT
GLOVE ORTHO TXT STRL SZ7.5 (GLOVE) ×3 IMPLANT
GLOVE SURG 8.5 LATEX PF (GLOVE) ×3 IMPLANT
GOWN STRL REUS W/ TWL LRG LVL3 (GOWN DISPOSABLE) ×1 IMPLANT
GOWN STRL REUS W/TWL 2XL LVL3 (GOWN DISPOSABLE) ×6 IMPLANT
GOWN STRL REUS W/TWL LRG LVL3 (GOWN DISPOSABLE) ×3
HEAD HALTER (SOFTGOODS) ×3 IMPLANT
KIT BASIN OR (CUSTOM PROCEDURE TRAY) ×3 IMPLANT
KIT ROOM TURNOVER OR (KITS) ×3 IMPLANT
MANIFOLD NEPTUNE II (INSTRUMENTS) IMPLANT
NEEDLE SPNL 20GX3.5 QUINCKE YW (NEEDLE) ×6 IMPLANT
NS IRRIG 1000ML POUR BTL (IV SOLUTION) ×9 IMPLANT
PACK ORTHO CERVICAL (CUSTOM PROCEDURE TRAY) ×3 IMPLANT
PAD ARMBOARD 7.5X6 YLW CONV (MISCELLANEOUS) ×9 IMPLANT
PATTIES SURGICAL .5 X.5 (GAUZE/BANDAGES/DRESSINGS) IMPLANT
PIN DISTRACTION 14 (PIN) ×6 IMPLANT
PIN DISTRACTION 14MM (PIN) ×12 IMPLANT
PLATE SKYLINE TWO LEVEL 32MM (Plate) ×3 IMPLANT
SCREW SKYLINE VAR OS 14MM (Screw) ×3 IMPLANT
SCREW VAR SELF TAP SKYLINE 14M (Screw) ×12 IMPLANT
SPONGE INTESTINAL PEANUT (DISPOSABLE) ×3 IMPLANT
SPONGE LAP 4X18 X RAY DECT (DISPOSABLE) IMPLANT
SPONGE SURGIFOAM ABS GEL 100 (HEMOSTASIS) ×3 IMPLANT
STRIP CLOSURE SKIN 1/2X4 (GAUZE/BANDAGES/DRESSINGS) ×2 IMPLANT
STRIP ILIUM TRICORT 2.2CMX60MM (Neuro Prosthesis/Implant) ×3 IMPLANT
SUCTION FRAZIER TIP 10 FR DISP (SUCTIONS) ×3 IMPLANT
SUT ETHILON 4 0 PS 2 18 (SUTURE) ×3 IMPLANT
SUT VIC AB 2-0 CT1 27 (SUTURE) ×6
SUT VIC AB 2-0 CT1 TAPERPNT 27 (SUTURE) ×2 IMPLANT
SUT VIC AB 3-0 X1 27 (SUTURE) ×3 IMPLANT
SUT VICRYL 4-0 PS2 18IN ABS (SUTURE) IMPLANT
SYR 20CC LL (SYRINGE) ×3 IMPLANT
SYR 30ML LL (SYRINGE) ×3 IMPLANT
SYR CONTROL 10ML LL (SYRINGE) ×6 IMPLANT
SYSTEM CHEST DRAIN TLS 7FR (DRAIN) ×3 IMPLANT
TOWEL OR 17X24 6PK STRL BLUE (TOWEL DISPOSABLE) ×3 IMPLANT
TOWEL OR 17X26 10 PK STRL BLUE (TOWEL DISPOSABLE) ×3 IMPLANT
TRAY FOLEY CATH 16FRSI W/METER (SET/KITS/TRAYS/PACK) ×3 IMPLANT
WATER STERILE IRR 1000ML POUR (IV SOLUTION) IMPLANT

## 2014-12-05 NOTE — Discharge Instructions (Signed)
No lifting greater than 10 lbs. No overhead use of arms. °Avoid bending,and twisting neck. °Walk in house for first week them may start to get out slowly increasing distance up to one mile by 3 weeks post op. °Keep incision dry for 3 days, may then bathe and wet incision using a [place] collar when showering. °Call if any fevers >101, chills, or increasing numbness or weakness or increased swelling or drainage. ° °

## 2014-12-05 NOTE — Anesthesia Procedure Notes (Signed)
Procedure Name: Intubation Date/Time: 12/05/2014 7:39 AM Performed by: Merrilyn Puma B Pre-anesthesia Checklist: Patient identified, Emergency Drugs available, Suction available, Patient being monitored and Timeout performed Patient Re-evaluated:Patient Re-evaluated prior to inductionOxygen Delivery Method: Circle system utilized Preoxygenation: Pre-oxygenation with 100% oxygen Intubation Type: IV induction Ventilation: Mask ventilation without difficulty Laryngoscope Size: Mac and 3 Grade View: Grade I Tube type: Oral Tube size: 7.0 mm Number of attempts: 1 Airway Equipment and Method: Stylet Placement Confirmation: breath sounds checked- equal and bilateral,  CO2 detector,  positive ETCO2 and ETT inserted through vocal cords under direct vision Secured at: 23 cm Tube secured with: Tape Dental Injury: Teeth and Oropharynx as per pre-operative assessment

## 2014-12-05 NOTE — Evaluation (Signed)
Physical Therapy Evaluation Patient Details Name: Natalie Dodson MRN: [id number] DOB: January 18, 1951 Today's Date: 12/05/2014   History of Present Illness  Pt admit for ACDF.   Clinical Impression  Pt admitted with above diagnosis. Pt currently with functional limitations due to the deficits listed below (see PT Problem List). Pt ambulated without device with supervision to min guard assist.  Will follow acutely.   Pt will benefit from skilled PT to increase their independence and safety with mobility to allow discharge to the venue listed below.      Follow Up Recommendations No PT follow up;Supervision - Intermittent    Equipment Recommendations  None recommended by PT    Recommendations for Other Services       Precautions / Restrictions Precautions Precautions: Cervical;Fall Required Braces or Orthoses: Cervical Brace Cervical Brace: Hard collar;At all times Restrictions Weight Bearing Restrictions: No      Mobility  Bed Mobility Overal bed mobility: Modified Independent             General bed mobility comments: used rail and cues for log roll.   Transfers Overall transfer level: Needs assistance Equipment used: None Transfers: Sit to/from Stand Sit to Stand: Supervision            Ambulation/Gait Ambulation/Gait assistance: Supervision;Min guard Ambulation Distance (Feet): 150 Feet Assistive device: 1 person Plucinski held assist (pt pushing IV pole.) Gait Pattern/deviations: Step-through pattern;Decreased stride length   Gait velocity interpretation: Below normal speed for age/gender General Gait Details: Pt was able to ambulate holding onto IV pole as she felt more steady with one UE support.  Pt was able to maintain balance well for first time up.    Stairs            Wheelchair Mobility    Modified Rankin (Stroke Patients Only)       Balance Overall balance assessment: Needs assistance Sitting-balance support: No upper extremity  supported;Feet supported Sitting balance-Leahy Scale: Fair     Standing balance support: During functional activity;Single extremity supported Standing balance-Leahy Scale: Fair Standing balance comment: pt can stand statically for up to 1 minute without UE support.               High level balance activites: Direction changes;Turns;Sudden stops High Level Balance Comments:  min guard assist for above             Pertinent Vitals/Pain Pain Assessment: No/denies pain  VSS    Home Living Family/patient expects to be discharged to:: Private residence Living Arrangements: Spouse/significant other;Children Available Help at Discharge: Family;Available 24 hours/day Type of Home: House Home Access: Stairs to enter Entrance Stairs-Rails: Right;Left;Can reach both Entrance Stairs-Number of Steps: 3 Home Layout: One level Home Equipment: Shower seat - built in      Prior Function Level of Independence: Independent               Chizmar Dominance        Extremity/Trunk Assessment   Upper Extremity Assessment: Defer to OT evaluation           Lower Extremity Assessment: Generalized weakness;LLE deficits/detail      Cervical / Trunk Assessment: Normal  Communication   Communication: No difficulties  Cognition Arousal/Alertness: Awake/alert Behavior During Therapy: WFL for tasks assessed/performed Overall Cognitive Status: Within Functional Limits for tasks assessed                      General Comments General comments (skin integrity, edema, etc.): Educated  regarding cervical handout to include positioning, care of neck brace and precautions.     Exercises        Assessment/Plan    PT Assessment Patient needs continued PT services  PT Diagnosis Generalized weakness   PT Problem List Decreased balance;Decreased activity tolerance;Decreased mobility;Decreased knowledge of use of DME;Decreased safety awareness;Decreased knowledge of  precautions;Pain  PT Treatment Interventions DME instruction;Gait training;Functional mobility training;Therapeutic activities;Therapeutic exercise;Balance training;Patient/family education;Stair training   PT Goals (Current goals can be found in the Care Plan section) Acute Rehab PT Goals Patient Stated Goal: to go home PT Goal Formulation: With patient Time For Goal Achievement: 12/12/14 Potential to Achieve Goals: Good    Frequency Min 5X/week   Barriers to discharge        Co-evaluation               End of Session Equipment Utilized During Treatment: Gait belt Activity Tolerance: Patient limited by fatigue Patient left: in bed;with call bell/phone within reach Nurse Communication: Mobility status    Functional Assessment Tool Used: clinical judgment Functional Limitation: Mobility: Walking and moving around Mobility: Walking and Moving Around Current Status (T7001): At least 1 percent but less than 20 percent impaired, limited or restricted Mobility: Walking and Moving Around Goal Status [phone]): 0 percent impaired, limited or restricted    Time: 1440-1510 PT Time Calculation (min) (ACUTE ONLY): 30 min   Charges:   PT Evaluation $Initial PT Evaluation Tier I: 1 Procedure PT Treatments $Gait Training: 8-22 mins   PT G Codes:   PT G-Codes **NOT FOR INPATIENT CLASS** Functional Assessment Tool Used: clinical judgment Functional Limitation: Mobility: Walking and moving around Mobility: Walking and Moving Around Current Status (H6759): At least 1 percent but less than 20 percent impaired, limited or restricted Mobility: Walking and Moving Around Goal Status [phone]): 0 percent impaired, limited or restricted    Denice Paradise 12/05/2014, 3:20 PM Maygan Koeller,PT Acute Rehabilitation [phone] (pager)

## 2014-12-05 NOTE — H&P (Signed)
Natalie Dodson is an 64 y.o. female.    CHIEF COMPLAINT:  Neck pain with radiation of discomfort into both upper extremities, right side greater than left.   HISTORY OF PRESENT ILLNESS:  She has a history of C5-6 fusion by myself 8 years ago.  She relates that her pain has been worsening since July 2016.  She had a steroid dose pack, which did not seem to relieve her pain.  Her studies have shown adjacent level degenerative disc changes at C3-4 and also at C6-7.  Her physical exam showed good strength in both upper and lower extremities.  No clonus.  No spasticity.  She was having pain with range of motion of her cervical spine, lateral bending and lateral rotation on both sides.       Past Medical History  Diagnosis Date  . MRSA (methicillin resistant staph aureus) culture positive 11-06-11    cultured from back surgery hardware-hardware removed  . PONV (postoperative nausea and vomiting) 11-06-11    nausea with anesthesia  . Arthritis   . GERD (gastroesophageal reflux disease)   . Chronic diarrhea     d/t lap band  . Fibromyalgia     Past Surgical History  Procedure Laterality Date  . 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 Appendectomy    . Hernia repair      umbilical  . Tonsillectomy    . Rotator cuff repair      bilateral  . Elbow surgery  11-06-11    bilateral "tendon release"  . Abdominal hysterectomy      then ovarian removal  . Back surgery      X6  . Cervical fusion    . Removal of laparoscopic gastric band  01/02/10    port removed prior to this surgery  . Carpal tunnel release      right Maciver  . 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 Bilateral oophorectomy    . Colonoscopy N/A 08/18/2012    RMR: Rectal polyp-removed. Colonic diverticulosis s/p segmental biopsies and stool sampling  . Vertical sleeve gastrectomy N/A   . Lumbar laminectomy/decompression microdiscectomy N/A 08/26/2014    Procedure: RIGHT L2-3 LATERAL RECESS DECOMPRESSION, BILATERAL L3-4 LATERAL  RECESS DECOMPRESSION, BILATERAL L4 FORAMINOTOMY;  Surgeon: Jessy Oto, MD;  Location: [facility];  Service: Orthopedics;  Laterality: N/A;    Family History  Problem Relation Age of Onset  . Colon cancer Neg Hx    Social History:  reports that she has never smoked. She does not have any smokeless tobacco history on file. She reports that she does not drink alcohol or use illicit drugs.  Allergies:  Allergies  Allergen Reactions  . Vancomycin Hives and Other (See Comments)    Can spread all over the body.    Medications Prior to Admission  Medication Sig Dispense Refill  . citalopram (CELEXA) 20 MG tablet Take 1 tablet (20 mg total) by mouth daily. 90 tablet 1  . esomeprazole (NEXIUM) 40 MG capsule Take 40 mg by mouth 2 (two) times daily as needed (for acid reflux).     . gabapentin (NEURONTIN) 300 MG capsule Take 1 capsule (300 mg total) by mouth 2 (two) times daily. 180 capsule 1  . oxyCODONE (ROXICODONE) 15 MG immediate release tablet Take 1 tablet (15 mg total) by mouth every 4 (four) hours as needed for severe pain. 50 tablet 0  . methocarbamol (ROBAXIN) 500 MG tablet Take 1 tablet (500 mg total) by mouth every 6 (  six) hours as needed for muscle spasms. (Patient not taking: Reported on 12/01/2014) 50 tablet 1    No results found for this or any previous visit (from the past 48 hour(s)). No results found.  Review of Systems  Constitutional: Negative.   Respiratory: Negative.   Cardiovascular: Negative.   Gastrointestinal: Negative.   Musculoskeletal: Positive for neck pain.  Skin: Negative.   Psychiatric/Behavioral: Negative.     Blood pressure 125/65, pulse 51, temperature 97.9 F (36.6 C), temperature source Oral, resp. rate 18, height 5\' 3"  (1.6 Dodson), weight 76.261 kg (168 lb 2 oz), SpO2 99 %. Physical Exam  Constitutional: She is oriented to person, place, and time. She appears well-nourished.  HENT:  Head: Normocephalic.  Eyes: EOM are normal.  Cardiovascular: Normal  rate.   Respiratory: Effort normal.  GI: She exhibits no distension.  Neurological: She is alert and oriented to person, place, and time.  Skin: Skin is warm.  Psychiatric: She has a normal mood and affect.      PHYSICAL EXAMINATION:  Natalie Dodson's exam shows that she is painful along the right paracervical muscles, strap muscles and over the posterior aspect on the right side.  She has a positive Spurling sign with extension of the neck and lateral rotation of the right side.  Most of her pain seems to radiate along the left side into the left shoulder and left scapular area.  Tinel sign at the elbow and at the wrist negative on the right side.     RADIOGRAPHS:  MRI scan of her cervical spine was ordered and the results of the MRI scan are available for review.  The study was done on 11/22/2014.  The study shows that she has interbody fusion at C5-6.  Moderate facet disease with partial facet fusion at C5-6.  The spinal cord itself demonstrates normal signal intensity with no lesions or syrinx.  She has minimal disc bulge at C2-3 without spinal or foraminal narrowing.  At C3-4, there is a significant right-sided disc osteophyte complex with right-sided facet disease causing moderate severe right foraminal stenosis likely affecting her right C4 nerve root.  At C4-5, there is an osteophytic ridge with flattening of the ventral thecal sac, near obliteration of the ventral CSF space.  Mild biforaminal stenosis, right side greater than left.  At C5-6, minimal left foraminal encroachment at C6-7.  Flattening of the ventral thecal sac with mild foraminal stenosis bilaterally.  She has no EMGs or nerve conduction studies.  Review of Natalie Dodson's MRI scan shows that she does have significant degenerative changes occurring at the C3-4 level and at C4-5 with degenerative disc disease and it is apparent at both segments that foraminal stenosis is also apparent bilaterally at C4-5 and right side at C3-4.  These are directly  adjacent to her previous fusion at the C4-5 level.    ASSESSMENT:  Her pain pattern is such that it is suspicious and probable for adjacent level disease at C3-4 and C4-5 to be the source of her pain into her neck and right shoulder and parascapular area.  The C4 nerve root does not have a motor that can be readily assessed by EMG and nerve conduction studies.  The degree of degeneration in her cervical spine suggests that this condition is chronic and likely she is having worsening of the spondylosis findings in her cervical region.  However, much of her pain is in her neck and central to her shoulders, so that foraminotomy alone unfortunately may not relieve all  of her pain, particularly if it is limited to one side only, as foraminotomies are.  With that in mind, the only surgery I can offer her is extension of her fusion to C3-4 and C4-5.  In this way, we would eliminate the mechanical pain associated with degenerative disc disease and also relieve the foraminal stenosis at these segments.  To decrease tendencies to nonunion, corpectomy would be done of the C4 central vertebral body to allow for exposure and decompression of both C3-4 and C4-5 with allograft strut graft and plate and then bridging the anterior aspect of the cervical spine from C3 to C6.  This would leave her with a single level that shows degeneration and spondylosis at C6-7; however, her symptoms do not appear to be in the distribution of this segment.  In the future she may very well require intervention at that segment.  She also has a very minimal anterolisthesis of C7 on T1, which does not have stenosis associated with it.    PLAN:  Natalie Dodson indicates that she wants to go ahead and have her neck fixed and she is concerned because she is going to change insurance or be without insurance relatively soon, within the next month.  I discussed this with Natalie Dodson and I have explained to her that the insurance matter is not the reason for doing  surgery, but if her pain is continuing to require the use of narcotic medicines, and she is unable to effectively relieve her discomfort, then intervention is warranted.  She is currently taking oxycodone for relieving her neck and shoulder pain.  Back pain is improved.  Will go ahead and schedule this patient for a corpectomy centrally at the C4 level with decompression at C3-4 and C4-5, removal of plate and screws at the C5-6 level, and extension of her fusion then from C3 to C6, 2 additional levels.  Explained to Natalie Dodson that this will result in significant stiffness and loss of motion of almost 50%.  Will probably inhibit her driving capacity and her ability to drive well and move her head from side-to-side.  Although patients do drive with this, it is important to realize that there is loss of function and loss of range of motion.  I expect that she would have significant relief of pain centrally into her scapula and into her right arm.  There is, however, concern about the remaining levels of her cervical spine that may eventually require intervention at a later date.  She understands this and wishes to proceed.  The risks of infection, bleeding, and neurologic compromise were discussed with Natalie Dodson and she still wishes to go ahead and proceed.  Natalie Dodson,Natalie Dodson 12/05/2014, 6:58 AM  Patient examined and lab reviewed with Ricard Dillon, PA-C.

## 2014-12-05 NOTE — Evaluation (Signed)
Occupational Therapy Evaluation Patient Details Name: Natalie Dodson MRN: [id number] DOB: June 12, 1950 Today's Date: 12/05/2014    History of Present Illness Pt s/p C4 Corpectomy with decompression C3-4 and C4-5 neuroforamen, allograft, ilac crest bone graft, cervical plate and screws. History of previous back surgeries and cervical fusion.   Clinical Impression   Pt s/p above. Pt independent with ADLs, PTA. Plan to see pt with boyfriend present tomorrow to educate on cervical collar.     Follow Up Recommendations  No OT follow up;Supervision - Intermittent    Equipment Recommendations  None recommended by OT    Recommendations for Other Services       Precautions / Restrictions Precautions Precautions: Cervical;Fall Precaution Comments: educated on precautions Required Braces or Orthoses: Cervical Brace Cervical Brace: Hard collar;At all times Restrictions Weight Bearing Restrictions: No      Mobility Bed Mobility Overal bed mobility: Needs Assistance Bed Mobility: Rolling;Sidelying to Sit;Sit to Sidelying Rolling: Independent;Supervision Sidelying to sit: Supervision     Sit to sidelying: Supervision General bed mobility comments: dropped down rail and lowered HOB, but did not practice with bed completely flat.   Transfers Overall transfer level: Needs assistance Transfers: Sit to/from Stand Sit to Stand: Supervision              Balance No LOB in session. Balance not formally assessed.       ADL Overall ADL's : Needs assistance/impaired     Grooming: Set up;Supervision/safety;Sitting;Standing             Upper Body Dressing Details (indicate cue type and reason): demonstrated on how to don/doff cervical collar Lower Body Dressing: Set up;Supervision/safety;Sit to/from stand   Toilet Transfer: Supervision/safety;Ambulation (sit to stand from bed)           Functional mobility during ADLs: Supervision/safety General ADL Comments:  Discussed incorporating precautions into functional activities. Pt able to don/doff socks, but cues given for precautions. Discussed AE briefly. Educated on safety. Educated on cervical collar.     Vision     Perception     Praxis      Pertinent Vitals/Pain Pain Assessment: 0-10 Pain Score: 2  Pain Location: throat Pain Descriptors / Indicators: Sore Pain Intervention(s): Monitored during session     Salman Dominance     Extremity/Trunk Assessment Upper Extremity Assessment Upper Extremity Assessment: Overall WFL for tasks assessed   Lower Extremity Assessment Lower Extremity Assessment: Defer to PT evaluation LLE Sensation: decreased light touch   Cervical / Trunk Assessment Cervical / Trunk Assessment: Normal   Communication Communication Communication: No difficulties   Cognition Arousal/Alertness: Awake/alert Behavior During Therapy: WFL for tasks assessed/performed Overall Cognitive Status: Within Functional Limits for tasks assessed                     General Comments       Exercises       Shoulder Instructions      Home Living Family/patient expects to be discharged to:: Private residence Living Arrangements: Spouse/significant other;Children Available Help at Discharge: Family and boyfriend Type of Home: House Home Access: Stairs to enter Technical brewer of Steps: 3 Entrance Stairs-Rails: Right;Left;Can reach both Home Layout: One level     Bathroom Shower/Tub: Occupational psychologist: Standard Bathroom Accessibility: Yes   Home Equipment: Shower seat - built in          Prior Functioning/Environment Level of Independence: Independent  OT Diagnosis: Generalized weakness   OT Problem List: Pain;Decreased knowledge of precautions;Decreased knowledge of use of DME or AE   OT Treatment/Interventions: Self-care/ADL training;Balance training;Patient/family education;Therapeutic activities;DME and/or AE  instruction    OT Goals(Current goals can be found in the care plan section) Acute Rehab OT Goals Patient Stated Goal: not stated OT Goal Formulation: With patient Time For Goal Achievement: 12/12/14 Potential to Achieve Goals: Good ADL Goals Pt Will Perform Lower Body Dressing: with set-up;sit to/from stand Additional ADL Goal #1: Pt/family will be independent with managing cervical collar. Additional ADL Goal #2: Pt will independently maintain cervical precautions during session/functional activities with no verbal cues.   OT Frequency: Min 2X/week   Barriers to D/C:            Co-evaluation              End of Session Equipment Utilized During Treatment: Cervical collar  Activity Tolerance: Patient tolerated treatment well Patient left: in bed;with call bell/phone within reach   Time: 2878-6767 OT Time Calculation (min): 20 min Charges:  OT General Charges $OT Visit: 1 Procedure OT Evaluation $Initial OT Evaluation Tier I: 1 Procedure G-Codes: OT G-codes **NOT FOR INPATIENT CLASS** Functional Assessment Tool Used: clinical judgment Functional Limitation: Self care Self Care Current Status (M0947): At least 1 percent but less than 20 percent impaired, limited or restricted Self Care Goal Status (S9628): 0 percent impaired, limited or restricted  Benito Mccreedy OTR/L [phone]  12/05/2014, 5:25 PM

## 2014-12-05 NOTE — Anesthesia Preprocedure Evaluation (Signed)
Anesthesia Evaluation  Patient identified by MRN, date of birth, ID band Patient awake    Reviewed: NPO status , Patient's Chart, lab work & pertinent test results  History of Anesthesia Complications (+) PONV and history of anesthetic complications  Airway Mallampati: II  TM Distance: >3 FB Neck ROM: Full    Dental  (+) Teeth Intact, Dental Advisory Given   Pulmonary neg pulmonary ROS,    Pulmonary exam normal breath sounds clear to auscultation       Cardiovascular negative cardio ROS Normal cardiovascular exam Rhythm:Regular Rate:Normal     Neuro/Psych PSYCHIATRIC DISORDERS Anxiety    GI/Hepatic Neg liver ROS, GERD  Controlled,  Endo/Other  Morbid obesity  Renal/GU negative Renal ROS     Musculoskeletal  (+) Arthritis , Fibromyalgia -, narcotic dependent  Abdominal   Peds  Hematology   Anesthesia Other Findings   Reproductive/Obstetrics                             Anesthesia Physical  Anesthesia Plan  ASA: II  Anesthesia Plan: General   Post-op Pain Management:    Induction: Intravenous  Airway Management Planned: Oral ETT  Additional Equipment: Arterial line  Intra-op Plan:   Post-operative Plan: Extubation in OR  Informed Consent: I have reviewed the patients History and Physical, chart, labs and discussed the procedure including the risks, benefits and alternatives for the proposed anesthesia with the patient or authorized representative who has indicated his/her understanding and acceptance.   Dental advisory given  Plan Discussed with: CRNA  Anesthesia Plan Comments: (2 x PIV)        Anesthesia Quick Evaluation

## 2014-12-05 NOTE — Progress Notes (Signed)
Orthopedic Tech Progress Note Patient Details:  Natalie Dodson 03/22/1950 [id number]  Ortho Devices Type of Ortho Device: [place] cervical collar Ortho Device/Splint Interventions: Application   [name] 12/05/2014, 12:18 PM

## 2014-12-05 NOTE — Anesthesia Postprocedure Evaluation (Signed)
Anesthesia Post Note  Patient: Natalie Dodson  Procedure(s) Performed: Procedure(s) (LRB): C4 Corpectomy with decompression C3-4 and C4-5 neuroforamen, allograft, ilac crest bone graft, cervical plate and screws (N/A)  Anesthesia type: General  Patient location: PACU  Post pain: Pain level controlled  Post assessment: Post-op Vital signs reviewed  Last Vitals: BP 132/59 mmHg  Pulse 55  Temp(Src) 36.8 C (Oral)  Resp 16  Ht 5\' 3"  (1.6 m)  Wt 168 lb 2 oz (76.261 kg)  BMI 29.79 kg/m2  SpO2 100%  Post vital signs: Reviewed  Level of consciousness: sedated  Complications: No apparent anesthesia complications

## 2014-12-05 NOTE — Op Note (Signed)
12/05/2014  11:31 AM  PATIENT:  Natalie Dodson  64 y.o. 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 Tobias Alexander.  QIH:[id number]    COMPLICATIONS:  None.     COMPONENTS:  Implant Name Type Inv. Item Serial No. Manufacturer Lot No. LRB No. Used  STRIP ILIUM TRICORT 2.2CMX60MM - [id number] Neuro Prosthesis/Implant STRIP ILIUM TRICORT 2.2CMX60MM [id number] MUSCULOSKELE[employer] TRANSPLANT FNDN  N/A 1  PLATE SKYLINE TWO LEVEL 32MM - [id number] Plate PLATE SKYLINE TWO LEVEL 32MM  DEPUY SPINE  N/A 1  SCREW VAR SELF TAP SKYLINE 25M - [id number] Screw SCREW VAR SELF TAP SKYLINE 25M  DEPUY SPINE  N/A 4  SCREW SKYLINE VAR OS 25MM - [id number] Screw SCREW SKYLINE VAR OS 25MM  DEPUY SPINE  N/A 1  BONE MATRIX VIVIGEN 1CC - [id number] Bone Implant BONE MATRIX VIVIGEN 1CC [id number] [facility]  N/A 1  BONE MATRIX VIVIGEN 1CC - [id number] Bone Implant BONE MATRIX VIVIGEN 1CC [id number] [employer] [place]   N/A 1    DRAINS: 7 french TLS drain left anterior cervical spine, foley to SD.   PROCEDURE:The patient was met in the holding area, and the appropriate Left cervical C3-4 and C4-5 level identified and marked with an "X" and my initials. The patient was then transported to OR and was placed on the operative table in a supine position. The patient  was then placed under  general anesthesia without difficulty. The patient received appropriate preoperative antibiotic prophylaxis.  . Nursing staff inserted a Foley catheter under sterile conditions cervical spine was positioned with a [place] horseshoe and 5 pound cervical halter traction. All pressure points well padded and semi-beach chair position. Arm holder both arms. Standard prep with DuraPrep solution the anterior cervical spine chest. Draped in the usual manner. Iodine vi drape was used. Standard timeout protocol was carried out identifying the patient procedure side of the procedure and level. The skin the left neck was infiltrated with marcaine 0.5% 1:1 exparel 1.3% total 12 cc. This at the level of expected C4-5 incision and also along the  Lines of Langer. Incision transverse at the C4-5 level and carried down to the level of the platysma and then medial to sternocleidomastoid muscle. Small superficial veins were suture ligated with 2-0 vicryl. The interval between the trachea and esophagus medially and the carotid sheath laterally was developed as a Metzenbaum scissors and blunt dissection exposing the anterior aspect of cervical spine at the C3.4 and C4-5 levels.  Attention then turned to the C4-5 level where an 18-gauge spinal needle were placed with sheath intact allowing only a centimeter to extend into the C4-5 disc and observed on lateral C-arm at the C4-5level. Handheld Cloward retraction of the soft tissues while identifying the level at C4-5 removing a portion of the anterior aspect of the disc with15 blade scalpel and pituitary. Additionally a portion of the anterior disc at  C3-4 was removed. Medial border of the longus collie muscles was carefully elevated bilaterally and self-retaining retractors were introduced the foot of the blade beneath the medial border of the longus colli muscles. Soft tissue overlying the anterior borders of the disc space at C3-4 and C4-5 level carefully  debridement of soft tissue back to bony edges. The anterior lip osteophytes were then resected at C4-5 and C3-4 using rongeur. This bone was preserved for later bone grafting purposes.Distraction pins placed first at the C3 level and at the C5 level. The C3-4 disc space was then first prepared [place] with resection of degenerative disc annulus anteriorly and posteriorly and cartilaginous endplates using micro-curettes pituitaries and a high-speed bur.The disc space at C4-5 was similarly prepared with high speed burr and curettes. The operating room microscope was used. Under the operating room microscope and posterior aspect of the disc was excised using micro-curettes pituitary rongeur and times per. Posterior lip osteophytes were drilled using a high-speed bur and a carefully resected with 1 and 2 mm Kerrison foraminotomy performed over both C4 nerve roots using 1 and 2 mm Kerrisons disc and spur material was noted centrally and into the right foramen some of which represented reflected portion of the patient's annulus into the neuroforamen. Additionally there was significant spur into the right side C4 neuroforamen affecting the right C4 nerve. The 3 mm high speed bur was then used to make a central trough in the C4 verterbral body, a rounger was not able to cut through the body of C4. Care was taken to center the OR microscope and the patient's cervical spine and chin during the corpectomy. Following decompression of the spinal cord at C3-4, C4 vertebral body and C4-5,the C5 nerve roots were decompressed with 1 and 3m kerrisons, irrigation was carried out. Bleeding controlled using some bone wax during the initial part of the central corpectomy at C4 excess bone wax removed and then Gelfoam thrombin-soaked and flow-seal. The endplates of the inferior aspect of C3 and superior aspect of C5 were carefully prepared using a high-speed bur to parallel. The trough space was then sounded utilizing a tranzgraft sounder  with measured 163min width and 12 mm in depth. The trough was measured at 1530mn width and 18 mm in depth.  A portion of sterile paper used to measure the length of the height and width of the expected allograft tricortical iliac bone strut spacer felt to provide best fit for the C3-5 corpectomy space. The allograft tricortical iliac crest implant was then fashioned on the OR field utilizing an oscillating saw and high speed spur the ends on the strut graft carefully rounded and undercut to allow seating into the end plated. The grafIt was then carefully positioned over the C4 no soft tissue within the corpectomy site that could be retropulsed with graft placement. The implant was then carefully placed over the intervertebral disc space at C3-5 level.The graft was then impacted into place with the head placed in longitudinal cervical traction.  The graft was felt to be in excellent position alignment. Distraction pins at the C3 and C5 removed.Bone wax applied to the pin openings.This point irrigation was carried out cervical incision site and lower cervical incision site. Esophagus examined and the upper cervical level as well as at the lower cervical level and found to be normal. Irrigation was again carried out there was no active bleeding present.Longitudinal halter traction was discontinued. The length for cervical plate determined with calipers. Anterior lip osteophytes were then  resected about the anterior C4-5 and C3-4 levels using a high-speed bur. This area smoothed for application of the plate to the anterior surface of the cervical spine from C3-C5 A 32 mm plate chosen. The plate carefully applied to the anterior cervical spine and aligned. 48m screws placed at the right C3 and C5 levels. Drilling 14 mm hole right side at the C3 level and a 14 mm screw placed. A drill hole made on the left side at C3 to 14 mm and a 14 mm screw placed. A second screw then placed on the right side at the C5 level and on  the left at the C5 level. Total of 4x 14 mm screws were placed each locked within the plate quite nicely. Intraoperative lateral C-arm of the cervical spine demonstrate plates and screws and bone grafts to be in good position alignment without any significant  canal impingement or retropulsion of graft. Irrigation was carried out at cervical incision site and lower cervical incision site. Esophagus examined and the upper cervical level as well as at the lower cervical level and found to be normal. There was no active bleeding present. Additional bone graft chips were placed through openings in the  anterior cervical plate at the corpectomy level. A 7 French TLS drain then placed at the depth of the incision over the anterior cervical spine C3 to C5. The incisions were then closed by approximating the deep subcutaneous layers the platysma layer with interrupted 2-0 Vicryl suture and the superficial fascia overlying the sternocleidomastoid muscle with interrupted 3-0 Vicryl sutures. The subcutaneous layers were approximated with interrupted 3-0 Vicryl sutures as were the superficial layers. The skin was closed with a running subcutaneous stitch of 4-0 Vicryl at  the operative C4-5 transverse incision. Skin was approximated with Dermabond. Mepilex bandage was applied. A [place] collar was then applied to the cervical spine. drapes were removed. Patient's or table was then returned to the flat position. At the end of the cervical spine surgery case all instrument and sponge counts were correct. Patient was then reactivated extubated and returned to recovery room in satisfactory condition.  JBenjiman Core PA-C perform the duties of assistant surgeon performing careful retraction of the esophagus and trachea during the initial exposure and careful suctioning of neural elements cervical nerve roots and cervical cord. He was present from the beginning of case to the end of the case and assisted in positioning of the  patient in transfer the patient from bed to the stretcher at the end of the case. He closed the incision on the platysma layer to the skin. Applied dressing.   Natalie Dodson 12/05/2014, 11:31 AM

## 2014-12-05 NOTE — Interval H&P Note (Signed)
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 After consideration of risks, benefits and other options for treatment, the patient has consented to  Procedure(s): C4 Corpectomy with decompression C3-4 and C4-5 neuroforamen, allograft, ilac crest bone graft, cervical plate and screws (N/A) as a surgical intervention .  The patient's history has been reviewed, patient examined, no change in status, stable for surgery.  I have reviewed the patient's chart and labs.  Questions were answered to the patient's satisfaction.     Merryn Thaker E

## 2014-12-05 NOTE — Transfer of Care (Signed)
Immediate Anesthesia Transfer of Care Note  Patient: Natalie Dodson  Procedure(s) Performed: Procedure(s): C4 Corpectomy with decompression C3-4 and C4-5 neuroforamen, allograft, ilac crest bone graft, cervical plate and screws (N/A)  Patient Location: PACU  Anesthesia Type:General  Level of Consciousness: awake and alert   Airway & Oxygen Therapy: Patient Spontanous Breathing and Patient connected to nasal cannula oxygen  Post-op Assessment: Report given to RN, Post -op Vital signs reviewed and stable and Patient moving all extremities X 4  Post vital signs: Reviewed and stable  Last Vitals:  Filed Vitals:   12/05/14 1159  BP: 141/62  Pulse: 76  Temp: 36.3 C  Resp: 18    Complications: No apparent anesthesia complications

## 2014-12-05 NOTE — Brief Op Note (Signed)
12/05/2014  11:27 AM  PATIENT:  Natalie Dodson  64 y.o. 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 Tobias Alexander.  EBL:  Total I/O In: 1000 [I.V.:1000] Out: 1150 [Urine:900; Blood:250]  BLOOD ADMINISTERED:none  DRAINS: Urinary Catheter (Foley) and (7 french) TLS Drain(s) to suction in the left anterior cervical spine   LOCAL MEDICATIONS USED:  MARCAINE 0.5% 1:1 EXPAREL 1.3% Amount: 12 ml  SPECIMEN:  No Specimen  DISPOSITION OF SPECIMEN:  N/A  COUNTS:  YES  TOURNIQUET:  * No tourniquets in log *  DICTATION: .Dragon Dictation  PLAN OF CARE: Admit for overnight observation  PATIENT DISPOSITION:  PACU - hemodynamically stable.   Delay start of Pharmacological VTE agent (>24hrs) due to surgical blood loss or risk of bleeding: yes

## 2014-12-06 ENCOUNTER — Encounter (HOSPITAL_COMMUNITY): Payer: Self-pay | Admitting: Specialist

## 2014-12-06 DIAGNOSIS — M5031 Other cervical disc degeneration,  high cervical region: Secondary | ICD-10-CM | POA: Diagnosis not present

## 2014-12-06 LAB — BASIC METABOLIC PANEL
ANION GAP: 5 (ref 5–15)
BUN: 12 mg/dL (ref 6–20)
CALCIUM: 8.2 mg/dL — AB (ref 8.9–10.3)
CO2: 25 mmol/L (ref 22–32)
Chloride: 106 mmol/L (ref 101–111)
Creatinine, Ser: 0.8 mg/dL (ref 0.44–1.00)
Glucose, Bld: 136 mg/dL — ABNORMAL HIGH (ref 65–99)
POTASSIUM: 5.3 mmol/L — AB (ref 3.5–5.1)
SODIUM: 136 mmol/L (ref 135–145)

## 2014-12-06 LAB — CBC
HCT: 29.1 % — ABNORMAL LOW (ref 36.0–46.0)
Hemoglobin: 9.5 g/dL — ABNORMAL LOW (ref 12.0–15.0)
MCH: 28.8 pg (ref 26.0–34.0)
MCHC: 32.6 g/dL (ref 30.0–36.0)
MCV: 88.2 fL (ref 78.0–100.0)
PLATELETS: 165 10*3/uL (ref 150–400)
RBC: 3.3 MIL/uL — AB (ref 3.87–5.11)
RDW: 14.2 % (ref 11.5–15.5)
WBC: 8.8 10*3/uL (ref 4.0–10.5)

## 2014-12-06 MED ORDER — ESOMEPRAZOLE MAGNESIUM 40 MG PO PACK: 40.0000 mg | PACK | Freq: Every day | ORAL | Status: DC

## 2014-12-06 MED ORDER — DOCUSATE SODIUM 100 MG PO CAPS: 100.0000 mg | ORAL_CAPSULE | Freq: Two times a day (BID) | ORAL | Status: DC

## 2014-12-06 MED ORDER — OXYCODONE HCL 15 MG PO TABS: 15.0000 mg | ORAL_TABLET | ORAL | Status: DC | PRN

## 2014-12-06 MED FILL — Thrombin For Soln 20000 Unit: CUTANEOUS | Qty: 1 | Status: AC

## 2014-12-06 NOTE — Progress Notes (Signed)
Patient refused home health at this time and verbalized that she doesn't need it. Staffs offer walker, pt refused.

## 2014-12-06 NOTE — Progress Notes (Signed)
Patient discharging home. Patient refuses home health at this time. Patient does not want walker for home. Discharge instruction reviewed with patient, patient verbalized understanding. IV removed, vital signs stable. Patient assisted off the unit.

## 2014-12-06 NOTE — Progress Notes (Addendum)
Occupational Therapy Treatment Patient Details Name: ELVIS BOOT MRN: [id number] DOB: 1950/10/11 Today's Date: 12/06/2014    History of present illness Pt s/p C4 Corpectomy with decompression C3-4 and C4-5 neuroforamen, allograft, ilac crest bone graft, cervical plate and screws. History of previous back surgeries and cervical fusion.   OT comments  Education provided to pt and her boyfriend. Pt planning to d/c today and feel she is safe to d/c home from OT standpoint.  Follow Up Recommendations  No OT follow up;Supervision - Intermittent    Equipment Recommendations  None recommended by OT    Recommendations for Other Services      Precautions / Restrictions Precautions Precautions: Cervical;Fall Precaution Comments: reviewed precautions; gave handout Required Braces or Orthoses: Cervical Brace Cervical Brace: Hard collar;At all times Restrictions Weight Bearing Restrictions: No       Mobility Bed Mobility               General bed mobility comments: reminded to be sure to log roll.  Transfers Overall transfer level: Modified independent                    Balance       No LOB in session. Balance not formally assessed.                              ADL Overall ADL's : Needs assistance/impaired                         Toilet Transfer: Modified Independent;Ambulation;Grab bars;Regular Toilet           Functional mobility during ADLs: Modified independent General ADL Comments: Educated on cervical collar and boyfriend present in session. Discussed incorporating precautions into functional activities (managing pets, oral care, drinking fluids, LB dressing). Educated on safety such as safe footwear, sitting for LB dressing, rugs/items on floor, and recommended someone be with her for shower transfer. Discussed driving.  Explained use of reacher.      Vision                     Perception     Praxis       Cognition  Awake/Alert Behavior During Therapy: WFL for tasks assessed/performed Overall Cognitive Status: Within Functional Limits for tasks assessed                       Extremity/Trunk Assessment               Exercises     Shoulder Instructions       General Comments      Pertinent Vitals/ Pain       Pain Assessment: 0-10 Pain Score: 3  Pain Location: neck  Pain Descriptors / Indicators: Aching Pain Intervention(s): Monitored during session  Home Living                                          Prior Functioning/Environment              Frequency Min 2X/week     Progress Toward Goals  OT Goals(current goals can now be found in the care plan section)  Progress towards OT goals: Progressing toward goals  Acute Rehab OT Goals Patient Stated Goal: not stated  OT Goal  Formulation: With patient Time For Goal Achievement: 12/12/14 Potential to Achieve Goals: Good ADL Goals Pt Will Perform Lower Body Dressing: with set-up;sit to/from stand Additional ADL Goal #1: Pt/family will be independent with managing cervical collar. Additional ADL Goal #2: Pt will independently maintain cervical precautions during session/functional activities with no verbal cues.   Plan Discharge plan remains appropriate    Co-evaluation                 End of Session Equipment Utilized During Treatment: Cervical collar   Activity Tolerance Patient tolerated treatment well   Patient Left with family/visitor present (on couch)   Nurse Communication          Time: 2957-4734 OT Time Calculation (min): 13 min  Charges: OT General Charges $OT Visit: 1 Procedure OT Treatments $Self Care/Home Management : 8-22 mins  Benito Mccreedy OTR/L [phone] 12/06/2014, 9:26 AM

## 2014-12-06 NOTE — Progress Notes (Signed)
Discharge instructions reviewed with patient/family. RXs already given to pt/family from MD. All questions answered at this time. Transport family.   Ave Filter, RN

## 2014-12-06 NOTE — Progress Notes (Signed)
     Subjective: 1 Day Post-Op Procedure(s) (LRB): C4 Corpectomy with decompression C3-4 and C4-5 neuroforamen, allograft, ilac crest bone graft, cervical plate and screws (N/A) Awake, alert and oriented x 4. Prefers percocet for pain. Tolerating po meds and nourishment. Patient reports pain as moderate.    Objective:   VITALS:  Temp:  [97.2 F (36.2 C)-98.6 F (37 C)] 98.6 F (37 C) (10/04 0537) Pulse Rate:  [54-76] 59 (10/04 0537) Resp:  [10-20] 18 (10/04 0537) BP: (114-141)/(52-69) 140/61 mmHg (10/04 0537) SpO2:  [93 %-100 %] 99 % (10/04 0537) Arterial Line BP: (151-171)/(55-72) 151/55 mmHg (10/03 1245)  Neurologically intact ABD soft Neurovascular intact Sensation intact distally Intact pulses distally Dorsiflexion/Plantar flexion intact Incision: scant drainage TLS drain removed from left anterior neck.   LABS  Recent Labs  12/06/14 0412  HGB 9.5*  WBC 8.8  PLT 165    Recent Labs  12/06/14 0412  NA 136  K 5.3*  CL 106  CO2 25  BUN 12  CREATININE 0.80  GLUCOSE 136*   No results for input(s): LABPT, INR in the last 72 hours.   Assessment/Plan: 1 Day Post-Op Procedure(s) (LRB): C4 Corpectomy with decompression C3-4 and C4-5 neuroforamen, allograft, ilac crest bone graft, cervical plate and screws (N/A)  Advance diet Up with therapy Discharge home with home health  NITKA,JAMES E 12/06/2014, 7:43 AM

## 2014-12-06 NOTE — Care Management Note (Signed)
Case Management Note  Patient Details  Name: JIANA LEMAIRE MRN: [id number] Date of Birth: 10/12/1950  Subjective/Objective:                    Action/Plan: Patients admitted with spinal stenosis. Pt had C4 corpectomy and C3-5 decompression. Patient being discharged home with orders for home health and rolling walker. Pt refusing both. Bedside RN aware.   Expected Discharge Date:                  Expected Discharge Plan:  [place]  In-House Referral:     Discharge planning Services  CM Consult  Post Acute Care Choice:    Choice offered to:     DME Arranged:  Walker rolling DME Agency:     HH Arranged:    [place]:     Status of Service:  Completed, signed off  Medicare Important Message Given:    Date Medicare IM Given:    Medicare IM give by:    Date Additional Medicare IM Given:    Additional Medicare Important Message give by:     If discussed at [employer] of Stay Meetings, dates discussed:    Additional Comments:  Pollie Friar, RN 12/06/2014, 9:55 AM

## 2014-12-17 ENCOUNTER — Other Ambulatory Visit: Payer: Self-pay | Admitting: Family Medicine

## 2014-12-19 MED ORDER — GABAPENTIN 300 MG PO CAPS: 300.0000 mg | ORAL_CAPSULE | Freq: Two times a day (BID) | ORAL | Status: DC

## 2014-12-19 NOTE — Addendum Note (Signed)
Addended by: Ofilia Neas R on: 12/19/2014 09:11 AM   Modules accepted: Orders

## 2014-12-29 NOTE — Discharge Summary (Signed)
Physician Discharge Summary      Patient ID: Natalie Dodson MRN: [id number] DOB/AGE: June 19, 1950 64 y.o.  Admit date: 12/05/2014 Discharge date: 12/06/2014  Admission Diagnoses:  Principal Problem:   Spinal stenosis in cervical region Active Problems:   Stenosis of cervical spine   Discharge Diagnoses:  Same  Past Medical History  Diagnosis Date  . MRSA (methicillin resistant staph aureus) culture positive 11-06-11    cultured from back surgery hardware-hardware removed  . PONV (postoperative nausea and vomiting) 11-06-11    nausea with anesthesia  . Arthritis   . GERD (gastroesophageal reflux disease)   . Chronic diarrhea     d/t lap band  . Fibromyalgia     Surgeries: Procedure(s): C4 Corpectomy with decompression C3-4 and C4-5 neuroforamen, allograft, ilac crest bone graft, cervical plate and screws on 46/04/7033   Consultants:    Discharged Condition: Improved  Hospital Course: Natalie Dodson is an 64 y.o. female who was admitted 12/05/2014 with a chief complaint of No chief complaint on file. , and found to have a diagnosis of Spinal stenosis in cervical region.  She was brought to the operating room on 12/05/2014 and underwent the above named procedures.    She was given perioperative antibiotics:  Anti-infectives    Start     Dose/Rate Route Frequency Ordered Stop   12/05/14 1600  ceFAZolin (ANCEF) IVPB 1 g/50 mL premix     1 g 100 mL/hr over 30 Minutes Intravenous Every 8 hours 12/05/14 1329 12/06/14 0059   12/05/14 0700  ceFAZolin (ANCEF) IVPB 2 g/50 mL premix     2 g 100 mL/hr over 30 Minutes Intravenous To ShortStay Surgical 12/04/14 1536 12/05/14 0805    On POD#1 she was awake, alert and oriented x 4. Dressing changed left anterior neck. TLS drain removed. Aspen collar in place. She was independent to her ambulation in the hallway and to the bathroom following the removal of the folely on POD#!Marland Kitchen There was some moderate bloody drainage with d/c of the foley.  She was discharged home on  POD#!Marland Kitchen  She was given sequential compression devices, early ambulation, and chemoprophylaxis for DVT prophylaxis.  She benefited maximally from their hospital stay and there were no complications.    Recent vital signs:  Filed Vitals:   12/06/14 0930  BP: 112/80  Pulse: 64  Temp: 98.4 F (36.9 C)  Resp: 18    Recent laboratory studies:  Results for orders placed or performed during the hospital encounter of 12/05/14  CBC  Result Value Ref Range   WBC 8.8 4.0 - 10.5 K/uL   RBC 3.30 (L) 3.87 - 5.11 MIL/uL   Hemoglobin 9.5 (L) 12.0 - 15.0 g/dL   HCT 29.1 (L) 36.0 - 46.0 %   MCV 88.2 78.0 - 100.0 fL   MCH 28.8 26.0 - 34.0 pg   MCHC 32.6 30.0 - 36.0 g/dL   RDW 14.2 11.5 - 15.5 %   Platelets 165 150 - 400 K/uL  Basic Metabolic Panel  Result Value Ref Range   Sodium 136 135 - 145 mmol/L   Potassium 5.3 (H) 3.5 - 5.1 mmol/L   Chloride 106 101 - 111 mmol/L   CO2 25 22 - 32 mmol/L   Glucose, Bld 136 (H) 65 - 99 mg/dL   BUN 12 6 - 20 mg/dL   Creatinine, Ser 0.80 0.44 - 1.00 mg/dL   Calcium 8.2 (L) 8.9 - 10.3 mg/dL   GFR calc non Af Amer >60 >60 mL/min  GFR calc Af Amer >60 >60 mL/min   Anion gap 5 5 - 15  Type and screen  Result Value Ref Range   ABO/RH(D) A POS    Antibody Screen NEG    Sample Expiration 12/08/2014   ABO/Rh  Result Value Ref Range   ABO/RH(D) A POS     Discharge Medications:     Medication List    TAKE these medications        citalopram 20 MG tablet  Commonly known as:  CELEXA  Take 1 tablet (20 mg total) by mouth daily.     docusate sodium 100 MG capsule  Commonly known as:  COLACE  Take 1 capsule (100 mg total) by mouth 2 (two) times daily.     esomeprazole 40 MG capsule  Commonly known as:  NEXIUM  Take 40 mg by mouth 2 (two) times daily as needed (for acid reflux).     esomeprazole 40 MG packet  Commonly known as:  NEXIUM  Take 40 mg by mouth daily before breakfast.     gabapentin 300 MG capsule    Commonly known as:  NEURONTIN  Take 1 capsule (300 mg total) by mouth 2 (two) times daily.     methocarbamol 500 MG tablet  Commonly known as:  ROBAXIN  Take 1 tablet (500 mg total) by mouth every 6 (six) hours as needed for muscle spasms.     oxyCODONE 15 MG immediate release tablet  Commonly known as:  ROXICODONE  Take 1 tablet (15 mg total) by mouth every 4 (four) hours as needed for pain.        Diagnostic Studies: Dg Cervical Spine 1 View  12/05/2014  CLINICAL DATA:  C4 corpectomy, C3-C5 fusion. EXAM: DG C-ARM 61-120 MIN; DG CERVICAL SPINE - 1 VIEW COMPARISON:  MRI 11/21/2014 FINDINGS: Solid bony fusion across the C5-6 disc space. There is an anterior plate which extends from C3-C6. Changes of corpectomy at C4. No hardware or bony complicating feature visualized on this single intraoperative spot image. IMPRESSION: Anterior fusion as above.  No visible complicating feature. Electronically Signed   By: Rolm Baptise M.D.   On: 12/05/2014 11:37   Dg C-arm 1-60 Min  12/05/2014  CLINICAL DATA:  C4 corpectomy, C3-C5 fusion. EXAM: DG C-ARM 61-120 MIN; DG CERVICAL SPINE - 1 VIEW COMPARISON:  MRI 11/21/2014 FINDINGS: Solid bony fusion across the C5-6 disc space. There is an anterior plate which extends from C3-C6. Changes of corpectomy at C4. No hardware or bony complicating feature visualized on this single intraoperative spot image. IMPRESSION: Anterior fusion as above.  No visible complicating feature. Electronically Signed   By: Rolm Baptise M.D.   On: 12/05/2014 11:37    Disposition: 01-Home or Self Care      Discharge Instructions    Call MD / Call 911    Complete by:  As directed   If you experience chest pain or shortness of breath, CALL 911 and be transported to the hospital emergency room.  If you develope a fever above 101 F, pus (white drainage) or increased drainage or redness at the wound, or calf pain, call your surgeon's office.     Constipation Prevention    Complete by:   As directed   Drink plenty of fluids.  Prune juice may be helpful.  You may use a stool softener, such as Colace (over the counter) 100 mg twice a day.  Use MiraLax (over the counter) for constipation as needed.  Diet - low sodium heart healthy    Complete by:  As directed      Discharge instructions    Complete by:  As directed   No lifting greater than 10 lbs. No overhead use of arms. Avoid bending,and twisting neck. Walk in house for first week them may start to get out slowly increasing distance up to one quarter mile by 3 weeks post op. Keep incision dry for 3 days, may then bathe and wet incision using a [place] collar when showering. Call if any fevers >101, chills, or increasing numbness or weakness or increased swelling or drainage.     Driving restrictions    Complete by:  As directed   No driving for 4 weeks     Increase activity slowly as tolerated    Complete by:  As directed      Lifting restrictions    Complete by:  As directed   No lifting for 8 weeks           Follow-up Information    Follow up with Lawernce Earll E, MD In 2 weeks.   Specialty:  Orthopedic Surgery   Contact information:   [place] [address] [phone]        Signed: Jessy Oto 12/29/2014, 11:42 PM

## 2015-04-24 ENCOUNTER — Encounter: Payer: Self-pay | Admitting: Nurse Practitioner

## 2015-04-24 ENCOUNTER — Ambulatory Visit (INDEPENDENT_AMBULATORY_CARE_PROVIDER_SITE_OTHER): Payer: 59 | Admitting: Nurse Practitioner

## 2015-04-24 VITALS — BP 132/80 | Ht 63.0 in | Wt 166.2 lb

## 2015-04-24 DIAGNOSIS — F419 Anxiety disorder, unspecified: Secondary | ICD-10-CM | POA: Diagnosis not present

## 2015-04-24 DIAGNOSIS — Z780 Asymptomatic menopausal state: Secondary | ICD-10-CM

## 2015-04-24 DIAGNOSIS — Z0000 Encounter for general adult medical examination without abnormal findings: Secondary | ICD-10-CM | POA: Diagnosis not present

## 2015-04-24 DIAGNOSIS — M8580 Other specified disorders of bone density and structure, unspecified site: Secondary | ICD-10-CM

## 2015-04-24 DIAGNOSIS — Z139 Encounter for screening, unspecified: Secondary | ICD-10-CM | POA: Diagnosis not present

## 2015-04-24 DIAGNOSIS — Z01419 Encounter for gynecological examination (general) (routine) without abnormal findings: Secondary | ICD-10-CM

## 2015-04-24 MED ORDER — METHOCARBAMOL 500 MG PO TABS: 500.0000 mg | ORAL_TABLET | Freq: Four times a day (QID) | ORAL | Status: DC | PRN

## 2015-04-24 MED ORDER — ALPRAZOLAM 0.5 MG PO TABS: 0.5000 mg | ORAL_TABLET | Freq: Two times a day (BID) | ORAL | Status: DC | PRN

## 2015-04-24 NOTE — Progress Notes (Signed)
Subjective:    Patient ID: Natalie Dodson, female    DOB: Mar 19, 1950, 65 y.o.   MRN: [id number]  HPI presents  For her wellness physical. Regular physician and dental care. Same sexual partner. Has had a hysterectomy BSO , defers pelvic and rectal exams today. Recently had cervical spine surgery, will need to have another spinal surgery. Very active. Doing well with her diet and weight. Has had the gastric sleeve.    Review of Systems  Constitutional: Negative for fever, activity change, appetite change and fatigue.  HENT: Negative for dental problem, ear pain, sinus pressure and sore throat.   Respiratory: Negative for cough, chest tightness, shortness of breath and wheezing.   Cardiovascular: Negative for chest pain.  Gastrointestinal: Positive for diarrhea. Negative for nausea, vomiting, abdominal pain, constipation and abdominal distention.       Occasional diarrhea only with extreme anxiety.   Genitourinary: Negative for dysuria, urgency, frequency, vaginal discharge, enuresis, difficulty urinating, genital sores and pelvic pain.       Objective:   Physical Exam  Constitutional: She is oriented to person, place, and time. She appears well-developed. No distress.  HENT:  Right Ear: External ear normal.  Left Ear: External ear normal.  Mouth/Throat: Oropharynx is clear and moist.  Neck: Normal range of motion. Neck supple. No tracheal deviation present. No thyromegaly present.  Cardiovascular: Normal rate, regular rhythm and normal heart sounds.  Exam reveals no gallop.   No murmur heard. Pulmonary/Chest: Effort normal and breath sounds normal.  Abdominal: Soft. She exhibits no distension. There is no tenderness.  Musculoskeletal: She exhibits no edema.  Lymphadenopathy:    She has no cervical adenopathy.  Neurological: She is alert and oriented to person, place, and time.  Skin: Skin is warm and dry. No rash noted.  Psychiatric: She has a normal mood and affect. Her  behavior is normal.  Vitals reviewed. Breast exam: no masses; axillae no adenopathy.         Assessment & Plan:   Problem List Items Addressed This Visit      Musculoskeletal and Integument   Osteopenia (Chronic)   Relevant Orders   VITAMIN D 25 Hydroxy (Vit-D Deficiency, Fractures)   DG Bone Density    Other Visit Diagnoses    Well woman exam    -  Primary    Relevant Orders    CBC with Differential/Platelet    Lipid panel    Basic metabolic panel    POC Hemoccult Bld/Stl (3-Cd Home Screen)    Anxiety        Relevant Medications    ALPRAZolam (XANAX) 0.5 MG tablet    Screening        Relevant Orders    CBC with Differential/Platelet    Lipid panel    Basic metabolic panel    POC Hemoccult Bld/Stl (3-Cd Home Screen)    Post-menopausal        Relevant Orders    DG Bone Density      Meds ordered this encounter  Medications  . ALPRAZolam (XANAX) 0.5 MG tablet    Sig: Take 1 tablet (0.5 mg total) by mouth 2 (two) times daily as needed for anxiety.    Dispense:  30 tablet    Refill:  0    Order Specific Question:  Supervising Provider    Answer:  Mikey Kirschner [[id number]]  . methocarbamol (ROBAXIN) 500 MG tablet    Sig: Take 1 tablet (500 mg total) by mouth every 6 (  six) hours as needed for muscle spasms.    Dispense:  50 tablet    Refill:  1    Order Specific Question:  Supervising Provider    Answer:  Mikey Kirschner [[id number]]   Take Xanax only for extreme anxiety; use sparingly. Do not take within 4 hours of opiate use. Encouraged daily vitamin D and calcium supplement.  Return in about 6 months (around 10/22/2015) for recheck.

## 2015-04-25 LAB — CBC WITH DIFFERENTIAL/PLATELET
BASOS: 1 %
Basophils Absolute: 0.1 10*3/uL (ref 0.0–0.2)
EOS (ABSOLUTE): 0.1 10*3/uL (ref 0.0–0.4)
EOS: 2 %
HEMATOCRIT: 34.9 % (ref 34.0–46.6)
Hemoglobin: 11.5 g/dL (ref 11.1–15.9)
IMMATURE GRANS (ABS): 0 10*3/uL (ref 0.0–0.1)
IMMATURE GRANULOCYTES: 0 %
LYMPHS: 46 %
Lymphocytes Absolute: 2.8 10*3/uL (ref 0.7–3.1)
MCH: 28.1 pg (ref 26.6–33.0)
MCHC: 33 g/dL (ref 31.5–35.7)
MCV: 85 fL (ref 79–97)
Monocytes Absolute: 0.4 10*3/uL (ref 0.1–0.9)
Monocytes: 6 %
NEUTROS ABS: 2.8 10*3/uL (ref 1.4–7.0)
Neutrophils: 45 %
Platelets: 199 10*3/uL (ref 150–379)
RBC: 4.09 x10E6/uL (ref 3.77–5.28)
RDW: 15.2 % (ref 12.3–15.4)
WBC: 6.2 10*3/uL (ref 3.4–10.8)

## 2015-04-25 LAB — BASIC METABOLIC PANEL
BUN / CREAT RATIO: 22 (ref 11–26)
BUN: 18 mg/dL (ref 8–27)
CHLORIDE: 102 mmol/L (ref 96–106)
CO2: 25 mmol/L (ref 18–29)
Calcium: 8.8 mg/dL (ref 8.7–10.3)
Creatinine, Ser: 0.82 mg/dL (ref 0.57–1.00)
GFR calc Af Amer: 89 mL/min/{1.73_m2} (ref >59)
GFR calc non Af Amer: 77 mL/min/{1.73_m2} (ref >59)
GLUCOSE: 87 mg/dL (ref 65–99)
POTASSIUM: 5.1 mmol/L (ref 3.5–5.2)
SODIUM: 141 mmol/L (ref 134–144)

## 2015-04-25 LAB — LIPID PANEL
CHOL/HDL RATIO: 2.6 ratio (ref 0.0–4.4)
Cholesterol, Total: 198 mg/dL (ref 100–199)
HDL: 77 mg/dL (ref >39)
LDL Calculated: 106 mg/dL — ABNORMAL HIGH (ref 0–99)
Triglycerides: 75 mg/dL (ref 0–149)
VLDL CHOLESTEROL CAL: 15 mg/dL (ref 5–40)

## 2015-04-25 LAB — VITAMIN D 25 HYDROXY (VIT D DEFICIENCY, FRACTURES): VIT D 25 HYDROXY: 17.2 ng/mL — AB (ref 30.0–100.0)

## 2015-04-26 ENCOUNTER — Encounter: Payer: Self-pay | Admitting: Nurse Practitioner

## 2015-04-27 ENCOUNTER — Other Ambulatory Visit: Payer: Self-pay | Admitting: Nurse Practitioner

## 2015-04-27 MED ORDER — VITAMIN D (ERGOCALCIFEROL) 1.25 MG (50000 UNIT) PO CAPS: 50000.0000 [IU] | ORAL_CAPSULE | ORAL | Status: DC

## 2015-04-28 ENCOUNTER — Ambulatory Visit (HOSPITAL_COMMUNITY)
Admission: RE | Admit: 2015-04-28 | Discharge: 2015-04-28 | Disposition: A | Discharge status: Home / Self Care | Payer: 59 | Source: Ambulatory Visit | Attending: Nurse Practitioner | Admitting: Nurse Practitioner

## 2015-04-28 DIAGNOSIS — M8580 Other specified disorders of bone density and structure, unspecified site: Secondary | ICD-10-CM | POA: Insufficient documentation

## 2015-04-28 DIAGNOSIS — Z780 Asymptomatic menopausal state: Secondary | ICD-10-CM | POA: Insufficient documentation

## 2015-05-25 ENCOUNTER — Other Ambulatory Visit: Payer: Self-pay | Admitting: Family Medicine

## 2015-05-26 ENCOUNTER — Emergency Department (HOSPITAL_COMMUNITY)
Admission: EM | Admit: 2015-05-26 | Discharge: 2015-05-26 | Disposition: A | Discharge status: Home / Self Care | Payer: 59 | Attending: Dermatology | Admitting: Dermatology

## 2015-05-26 ENCOUNTER — Encounter (HOSPITAL_COMMUNITY): Payer: Self-pay | Admitting: Emergency Medicine

## 2015-05-26 DIAGNOSIS — R112 Nausea with vomiting, unspecified: Secondary | ICD-10-CM | POA: Diagnosis present

## 2015-05-26 DIAGNOSIS — R52 Pain, unspecified: Secondary | ICD-10-CM | POA: Diagnosis not present

## 2015-05-26 DIAGNOSIS — Z5321 Procedure and treatment not carried out due to patient leaving prior to being seen by health care provider: Secondary | ICD-10-CM | POA: Insufficient documentation

## 2015-05-26 LAB — COMPREHENSIVE METABOLIC PANEL
ALBUMIN: 4.1 g/dL (ref 3.5–5.0)
ALT: 18 U/L (ref 14–54)
ANION GAP: 10 (ref 5–15)
AST: 29 U/L (ref 15–41)
Alkaline Phosphatase: 105 U/L (ref 38–126)
BILIRUBIN TOTAL: 1.1 mg/dL (ref 0.3–1.2)
BUN: 17 mg/dL (ref 6–20)
CHLORIDE: 106 mmol/L (ref 101–111)
CO2: 21 mmol/L — ABNORMAL LOW (ref 22–32)
Calcium: 9.1 mg/dL (ref 8.9–10.3)
Creatinine, Ser: 0.89 mg/dL (ref 0.44–1.00)
GFR calc Af Amer: >60 mL/min (ref >60)
GFR calc non Af Amer: >60 mL/min (ref >60)
GLUCOSE: 152 mg/dL — AB (ref 65–99)
POTASSIUM: 3.7 mmol/L (ref 3.5–5.1)
SODIUM: 137 mmol/L (ref 135–145)
TOTAL PROTEIN: 7.4 g/dL (ref 6.5–8.1)

## 2015-05-26 LAB — CBC
HEMATOCRIT: 37.3 % (ref 36.0–46.0)
HEMOGLOBIN: 12.7 g/dL (ref 12.0–15.0)
MCH: 29.1 pg (ref 26.0–34.0)
MCHC: 34 g/dL (ref 30.0–36.0)
MCV: 85.4 fL (ref 78.0–100.0)
Platelets: 247 10*3/uL (ref 150–400)
RBC: 4.37 MIL/uL (ref 3.87–5.11)
RDW: 13.4 % (ref 11.5–15.5)
WBC: 9.4 10*3/uL (ref 4.0–10.5)

## 2015-05-26 MED ORDER — ONDANSETRON 4 MG PO TBDP
4.0000 mg | ORAL_TABLET | Freq: Once | ORAL | Status: AC | PRN
  Administered 2015-05-26: 4 mg via ORAL
  Filled 2015-05-26: qty 1

## 2015-05-26 NOTE — ED Notes (Signed)
PT stated she was feeling a little better at this time of nausea and temp rechecked was 98.8 oral and she stated she was leaving at this time and going home with her husband.

## 2015-05-26 NOTE — ED Notes (Addendum)
PT c/o generalized body aches and nausea that started this am at 0800. PT denies any cough or urinary symptoms. PT vomiting in triage at this time.

## 2015-06-17 ENCOUNTER — Other Ambulatory Visit: Payer: Self-pay | Admitting: Family Medicine

## 2015-06-29 ENCOUNTER — Other Ambulatory Visit: Payer: Self-pay | Admitting: Nurse Practitioner

## 2015-07-20 ENCOUNTER — Other Ambulatory Visit: Payer: Self-pay | Admitting: Specialist

## 2015-07-20 DIAGNOSIS — G8929 Other chronic pain: Secondary | ICD-10-CM

## 2015-07-20 DIAGNOSIS — M25551 Pain in right hip: Secondary | ICD-10-CM

## 2015-07-20 DIAGNOSIS — M25512 Pain in left shoulder: Principal | ICD-10-CM

## 2015-07-20 DIAGNOSIS — M545 Low back pain: Secondary | ICD-10-CM

## 2015-07-20 DIAGNOSIS — M25511 Pain in right shoulder: Secondary | ICD-10-CM

## 2015-07-26 ENCOUNTER — Ambulatory Visit
Admission: RE | Admit: 2015-07-26 | Discharge: 2015-07-26 | Disposition: A | Discharge status: Home / Self Care | Payer: 59 | Source: Ambulatory Visit | Attending: Specialist | Admitting: Specialist

## 2015-07-26 VITALS — BP 119/57 | HR 54

## 2015-07-26 DIAGNOSIS — G8929 Other chronic pain: Secondary | ICD-10-CM

## 2015-07-26 DIAGNOSIS — M545 Low back pain, unspecified: Secondary | ICD-10-CM

## 2015-07-26 DIAGNOSIS — M48062 Spinal stenosis, lumbar region with neurogenic claudication: Secondary | ICD-10-CM

## 2015-07-26 DIAGNOSIS — M25511 Pain in right shoulder: Secondary | ICD-10-CM

## 2015-07-26 DIAGNOSIS — M25512 Pain in left shoulder: Principal | ICD-10-CM

## 2015-07-26 DIAGNOSIS — M25551 Pain in right hip: Secondary | ICD-10-CM

## 2015-07-26 DIAGNOSIS — M4802 Spinal stenosis, cervical region: Secondary | ICD-10-CM

## 2015-07-26 DIAGNOSIS — M5417 Radiculopathy, lumbosacral region: Secondary | ICD-10-CM

## 2015-07-26 MED ORDER — MEPERIDINE HCL 100 MG/ML IJ SOLN
100.0000 mg | Freq: Once | INTRAMUSCULAR | Status: AC
Start: 2015-07-26 — End: 2015-07-26
  Administered 2015-07-26: 100 mg via INTRAMUSCULAR

## 2015-07-26 MED ORDER — IOPAMIDOL (ISOVUE-M 300) INJECTION 61%
10.0000 mL | Freq: Once | INTRAMUSCULAR | Status: AC | PRN
  Administered 2015-07-26: 10 mL via INTRATHECAL

## 2015-07-26 MED ORDER — ONDANSETRON HCL 4 MG/2ML IJ SOLN
4.0000 mg | Freq: Once | INTRAMUSCULAR | Status: AC
  Administered 2015-07-26: 4 mg via INTRAMUSCULAR

## 2015-07-26 MED ORDER — DIAZEPAM 5 MG PO TABS
10.0000 mg | ORAL_TABLET | Freq: Once | ORAL | Status: AC
  Administered 2015-07-26: 10 mg via ORAL

## 2015-07-26 NOTE — Discharge Instructions (Signed)
Myelogram Discharge Instructions  1. Go home and rest quietly for the next 24 hours.  It is important to lie flat for the next 24 hours.  Get up only to go to the restroom.  You may lie in the bed or on a couch on your back, your stomach, your left side or your right side.  You may have one pillow under your head.  You may have pillows between your knees while you are on your side or under your knees while you are on your back.  2. DO NOT drive today.  Recline the seat as far back as it will go, while still wearing your seat belt, on the way home.  3. You may get up to go to the bathroom as needed.  You may sit up for 10 minutes to eat.  You may resume your normal diet and medications unless otherwise indicated.  Drink lots of extra fluids today and tomorrow.  4. The incidence of headache, nausea, or vomiting is about 5% (one in 20 patients).  If you develop a headache, lie flat and drink plenty of fluids until the headache goes away.  Caffeinated beverages may be helpful.  If you develop severe nausea and vomiting or a headache that does not go away with flat bed rest, call [phone].  5. You may resume normal activities after your 24 hours of bed rest is over; however, do not exert yourself strongly or do any heavy lifting tomorrow. If when you get up you have a headache when standing, go back to bed and force fluids for another 24 hours.  6. Call your physician for a follow-up appointment.  The results of your myelogram will be sent directly to your physician by the following day.  7. If you have any questions or if complications develop after you arrive home, please call [phone].  Discharge instructions have been explained to the patient.  The patient, or the person responsible for the patient, fully understands these instructions.      May resume Celexa on Jul 27, 2015, after 1:00 pm.

## 2015-07-26 NOTE — Progress Notes (Signed)
Pt states she has been off Celexa for the past 2 days. 

## 2015-08-10 ENCOUNTER — Other Ambulatory Visit: Payer: Self-pay | Admitting: Nurse Practitioner

## 2015-08-10 MED ORDER — ALPRAZOLAM 0.5 MG PO TABS: 0.5000 mg | ORAL_TABLET | Freq: Two times a day (BID) | ORAL | Status: DC | PRN

## 2015-08-31 ENCOUNTER — Ambulatory Visit (INDEPENDENT_AMBULATORY_CARE_PROVIDER_SITE_OTHER): Payer: 59 | Admitting: Family Medicine

## 2015-08-31 ENCOUNTER — Encounter: Payer: Self-pay | Admitting: Family Medicine

## 2015-08-31 VITALS — BP 132/78 | Temp 98.1°F | Ht 63.0 in | Wt 171.0 lb

## 2015-08-31 DIAGNOSIS — R21 Rash and other nonspecific skin eruption: Secondary | ICD-10-CM | POA: Diagnosis not present

## 2015-08-31 MED ORDER — DOXYCYCLINE HYCLATE 100 MG PO TABS: 100.0000 mg | ORAL_TABLET | Freq: Two times a day (BID) | ORAL | Status: DC

## 2015-08-31 NOTE — Progress Notes (Signed)
   Subjective:    Patient ID: Natalie Dodson, female    DOB: [birth year], 65 y.o.   MRN: [id number]  HPI Patient arrives with c/o left finger pain and swelling. Patient states she had a recent manicure but not sure if that is what caused it.   Review of Systems No headache, no major weight loss or weight gain, no chest pain no back pain abdominal pain no change in bowel habits complete ROS otherwise negative     Objective:   Physical Exam  Alert vital stable left distal finger inflamed with slight pocket of pus with blisterlike. NICU infection sterilize incised with 18 no plate injury      Assessment & Plan:  Impression cellulitis finger plan antibiotics prescribed local measures discussed WSL

## 2015-09-17 ENCOUNTER — Other Ambulatory Visit: Payer: Self-pay | Admitting: Family Medicine

## 2015-09-18 ENCOUNTER — Other Ambulatory Visit: Payer: Self-pay | Admitting: Nurse Practitioner

## 2015-10-27 ENCOUNTER — Ambulatory Visit (INDEPENDENT_AMBULATORY_CARE_PROVIDER_SITE_OTHER): Payer: 59 | Admitting: Nurse Practitioner

## 2015-10-27 ENCOUNTER — Encounter: Payer: Self-pay | Admitting: Nurse Practitioner

## 2015-10-27 VITALS — BP 126/82 | Ht 63.0 in | Wt 169.0 lb

## 2015-10-27 DIAGNOSIS — R5383 Other fatigue: Secondary | ICD-10-CM | POA: Diagnosis not present

## 2015-10-27 DIAGNOSIS — E559 Vitamin D deficiency, unspecified: Secondary | ICD-10-CM | POA: Diagnosis not present

## 2015-10-27 DIAGNOSIS — M797 Fibromyalgia: Secondary | ICD-10-CM

## 2015-10-27 DIAGNOSIS — M8580 Other specified disorders of bone density and structure, unspecified site: Secondary | ICD-10-CM

## 2015-10-28 ENCOUNTER — Encounter: Payer: Self-pay | Admitting: Nurse Practitioner

## 2015-10-28 LAB — VITAMIN D 25 HYDROXY (VIT D DEFICIENCY, FRACTURES): Vit D, 25-Hydroxy: 43.7 ng/mL (ref 30.0–100.0)

## 2015-10-28 LAB — TSH: TSH: 1.32 u[IU]/mL (ref 0.450–4.500)

## 2015-10-28 NOTE — Progress Notes (Signed)
Subjective:  Presents for routine follow up. Taking daily vitamin D. Continues to see specialist for chronic back and orthopedic problems. In addition has fibromyalgia with chronic migratory muscle aches and fatigue at times. Some relief with Xanax, Celexa and Gabapentin.   Objective:   BP 126/82   Ht 5\' 3"  (1.6 m)   Wt 169 lb (76.7 kg)   BMI 29.94 kg/m  NAD. Alert, oriented. Cheerful affect. Lungs clear. Heart RRR.   Assessment:  Problem List Items Addressed This Visit      Musculoskeletal and Integument   Fibromyalgia   Osteopenia (Chronic)   Relevant Orders   VITAMIN D 25 Hydroxy (Vit-D Deficiency, Fractures) (Completed)     Other   Vitamin D deficiency - Primary   Relevant Orders   VITAMIN D 25 Hydroxy (Vit-D Deficiency, Fractures) (Completed)    Other Visit Diagnoses    Other fatigue       Relevant Orders   TSH     Plan: discussed the importance of regular exercise and stress reduction for fibromyalgia. Patient limited in activity at this time due to back pain. Consider massage therapy. Patient understands this is a chronic condition with intermittent exacerbations.  Return in about 6 months (around 04/28/2016) for physical.

## 2015-11-28 ENCOUNTER — Other Ambulatory Visit: Payer: Self-pay | Admitting: Family Medicine

## 2015-11-28 NOTE — Telephone Encounter (Signed)
Dr Mariane Duval pt

## 2015-12-19 ENCOUNTER — Telehealth: Payer: Self-pay | Admitting: Nurse Practitioner

## 2015-12-19 NOTE — Telephone Encounter (Signed)
When patient was in a few weeks ago, her and [name] decided to increase the strength on her fibromyalgia medication.  Now she is out and needs this called in with the increased dosage updated with the pharmacy.  [employer]

## 2015-12-19 NOTE — Telephone Encounter (Signed)
Pt states she has enough til Natalie Dodson comes back tomorrow and wants message to go to Natalie Dodson

## 2015-12-20 ENCOUNTER — Other Ambulatory Visit: Payer: Self-pay | Admitting: Nurse Practitioner

## 2015-12-20 MED ORDER — GABAPENTIN 300 MG PO CAPS: 300.0000 mg | ORAL_CAPSULE | Freq: Three times a day (TID) | ORAL | 1 refills | Status: AC

## 2015-12-20 NOTE — Telephone Encounter (Signed)
done

## 2015-12-20 NOTE — Telephone Encounter (Signed)
Please be sure we went up on Gabapentin to TID and I will send in. Thanks.

## 2015-12-20 NOTE — Telephone Encounter (Signed)
Spoke with patient and patient stated that the gabapentin was increased to three times day during her last visit.

## 2015-12-26 ENCOUNTER — Encounter: Payer: Self-pay | Admitting: Family Medicine

## 2015-12-26 ENCOUNTER — Ambulatory Visit (INDEPENDENT_AMBULATORY_CARE_PROVIDER_SITE_OTHER): Payer: Self-pay | Admitting: Family Medicine

## 2015-12-26 VITALS — BP 126/74 | Temp 99.1°F | Ht 63.0 in | Wt 168.2 lb

## 2015-12-26 DIAGNOSIS — B9689 Other specified bacterial agents as the cause of diseases classified elsewhere: Secondary | ICD-10-CM

## 2015-12-26 DIAGNOSIS — J019 Acute sinusitis, unspecified: Secondary | ICD-10-CM

## 2015-12-26 MED ORDER — AMOXICILLIN-POT CLAVULANATE 875-125 MG PO TABS: 1.0000 | ORAL_TABLET | Freq: Two times a day (BID) | ORAL | 0 refills | Status: DC

## 2015-12-26 MED ORDER — CITALOPRAM HYDROBROMIDE 20 MG PO TABS: 20.0000 mg | ORAL_TABLET | Freq: Every day | ORAL | 3 refills | Status: DC

## 2015-12-26 NOTE — Progress Notes (Signed)
   Subjective:    Patient ID: Natalie Dodson, female    DOB: [birth year], 65 y.o.   MRN: [id number]  Sinusitis  This is a new problem. The current episode started more than 1 month ago. Associated symptoms include congestion, coughing, headaches and sinus pressure. Pertinent negatives include no ear pain or shortness of breath. Treatments tried: OTC sinus medications.   Patient states no other concerns this visit.   No allergies issues Review of Systems  Constitutional: Negative for activity change and fever.  HENT: Positive for congestion, rhinorrhea and sinus pressure. Negative for ear pain.   Eyes: Negative for discharge.  Respiratory: Positive for cough. Negative for shortness of breath and wheezing.   Cardiovascular: Negative for chest pain.  Neurological: Positive for headaches.       Objective:   Physical Exam  Constitutional: She appears well-developed.  HENT:  Head: Normocephalic.  Nose: Nose normal.  Mouth/Throat: Oropharynx is clear and moist. No oropharyngeal exudate.  Neck: Neck supple.  Cardiovascular: Normal rate and normal heart sounds.   No murmur heard. Pulmonary/Chest: Effort normal and breath sounds normal. She has no wheezes.  Lymphadenopathy:    She has no cervical adenopathy.  Skin: Skin is warm and dry.  Nursing note and vitals reviewed.   Mainly headaches and presurre  started about a week and a half go ahead congestion sinus pressure drainage coughing denies high fever chills     Assessment & Plan:  Patient was seen today for upper respiratory illness. It is felt that the patient is dealing with sinusitis. Antibiotics were prescribed today. Importance of compliance with medication was discussed. Symptoms should gradually resolve over the course of the next several days. If high fevers, progressive illness, difficulty breathing, worsening condition or failure for symptoms to improve over the next several days then the patient is to follow-up. If any  emergent conditions the patient is to follow-up in the emergency department otherwise to follow-up in the office.

## 2015-12-27 ENCOUNTER — Ambulatory Visit (INDEPENDENT_AMBULATORY_CARE_PROVIDER_SITE_OTHER): Payer: 59 | Admitting: Specialist

## 2016-01-03 ENCOUNTER — Telehealth: Payer: Self-pay | Admitting: Family Medicine

## 2016-01-03 MED ORDER — CEPHALEXIN 500 MG PO CAPS: ORAL_CAPSULE | ORAL | 0 refills | Status: DC

## 2016-01-03 NOTE — Telephone Encounter (Signed)
Keflex 500 tid ten d 

## 2016-01-03 NOTE — Telephone Encounter (Signed)
Pt called stating that the amoxicillin-clavulanate (AUGMENTIN) 875-125 MG tablet  Has given her severe diarrhea and is wanting to know if something else can be called in.    [facility]

## 2016-01-03 NOTE — Telephone Encounter (Signed)
Spoke with patient and informed her per Dr.Steve [place] sent into pharmacy. Patient verbalized understanding.

## 2016-01-11 ENCOUNTER — Encounter (INDEPENDENT_AMBULATORY_CARE_PROVIDER_SITE_OTHER): Payer: Self-pay | Admitting: Specialist

## 2016-01-11 ENCOUNTER — Ambulatory Visit (INDEPENDENT_AMBULATORY_CARE_PROVIDER_SITE_OTHER): Payer: BC Managed Care – PPO | Admitting: Specialist

## 2016-01-11 VITALS — BP 116/72 | HR 58 | Ht 63.0 in | Wt 160.0 lb

## 2016-01-11 DIAGNOSIS — M542 Cervicalgia: Secondary | ICD-10-CM | POA: Diagnosis not present

## 2016-01-11 NOTE — Progress Notes (Signed)
Office Visit Note   Patient: Natalie Dodson           Date of Birth: January 17, 1951           MRN: [id number] Visit Date: 01/11/2016              Requested by: Mikey Kirschner, MD [address] [place], [place] [address] PCP: Mickie Hillier, MD   Assessment & Plan: Visit Diagnoses:  1. Cervicalgia     Plan: Patient will continue to follow with [facility]. She has not had the cervical facet blocks as previous and recommended by Dr. Louanne Skye a few months ago.  I did write a note for her to give to the pain clinic seen at they will go ahead and do the injections. Follow up with [place] in 6 months for recheck. Return sooner if needed.. Follow-Up Instructions: Return in about 6 months (around 07/10/2016).   Orders:  No orders of the defined types were placed in this encounter.  No orders of the defined types were placed in this encounter.     Procedures: No procedures performed   Clinical Data: No additional findings.   Subjective: Chief Complaint  Patient presents with  . Neck - Pain, Follow-up  . Lower Back - Pain, Follow-up    Patient returning for a 2 month recheck on low back and neck pain. Patient went to her 1st pain management visit this past Monday. They increased the oxycodone to 15mg  2 times a day. Patient states she is doing about the same, no changes. Still experiencing pain, radicular pain in right leg. No numbness. Neck pain that goes in between shoulder blades.     Review of Systems  Constitutional: Negative.   HENT: Negative.   Respiratory: Negative.   Gastrointestinal: Negative for abdominal distention.  Genitourinary: Negative.   Musculoskeletal: Positive for back pain and neck pain.  Psychiatric/Behavioral: Negative.      Objective: Vital Signs: BP 116/72   Pulse (!) 58   Ht 5\' 3"  (1.6 m)   Wt 160 lb (72.6 kg)   BMI 28.34 kg/m   Physical Exam  Constitutional: She is oriented to person, place, and time. No distress.  HENT:    Head: Normocephalic and atraumatic.  Eyes: Pupils are equal, round, and reactive to light.  Neck: Normal range of motion.  Pulmonary/Chest: Effort normal.  Abdominal: She exhibits no distension.  Neurological: She is alert and oriented to person, place, and time.  Skin: Skin is warm and dry.  Psychiatric: She has a normal mood and affect.    Ortho Exam Gait is normal. She does have some limitation and C-spine range of motion. No brachial plexus tenderness. Negative logroll bilaterally. Negative straight leg raise. No focal motor deficits. Specialty Comments:  No specialty comments available.  Imaging: No results found.   PMFS History: Patient Active Problem List   Diagnosis Date Noted  . Vitamin D deficiency 10/27/2015  . Spinal stenosis in cervical region 12/05/2014    Class: Chronic  . Stenosis of cervical spine 12/05/2014  . Spinal stenosis, lumbar region, with neurogenic claudication 08/26/2014  . Radiculopathy of lumbosacral region 08/05/2014  . Adjustment disorder with anxious mood 02/23/2014  . Fibromyalgia 02/21/2014  . Osteopenia 08/21/2012  . Diarrhea 11/11/2010  . Morbid obesity ([facility]) 10/31/2010   Past Medical History:  Diagnosis Date  . Arthritis   . Chronic diarrhea    d/t lap band  . Fibromyalgia   . GERD (gastroesophageal reflux  disease)   . MRSA (methicillin resistant staph aureus) culture positive 11-06-11   cultured from back surgery hardware-hardware removed  . PONV (postoperative nausea and vomiting) 11-06-11   nausea with anesthesia    Family History  Problem Relation Age of Onset  . Colon cancer Neg Hx     Past Surgical History:  Procedure Laterality Date  . ABDOMINAL HYSTERECTOMY     then ovarian removal  . ANTERIOR CERVICAL DECOMP/DISCECTOMY FUSION N/A 12/05/2014   Procedure: C4 Corpectomy with decompression C3-4 and C4-5 neuroforamen, allograft, ilac crest bone graft, cervical plate and screws;  Surgeon: Jessy Oto, MD;  Location: [facility];   Service: Orthopedics;  Laterality: N/A;  . APPENDECTOMY    . BACK SURGERY     X6  . BILATERAL OOPHORECTOMY    . CARPAL TUNNEL RELEASE     right [place]  . CERVICAL FUSION    . COLONOSCOPY N/A 08/18/2012   RMR: Rectal polyp-removed. Colonic diverticulosis s/p segmental biopsies and stool sampling  . ELBOW SURGERY  11-06-11   bilateral "tendon release"  . HERNIA REPAIR     umbilical  . LAPAROSCOPIC GASTRIC BANDING  12/12/08  . LUMBAR LAMINECTOMY/DECOMPRESSION MICRODISCECTOMY N/A 08/26/2014   Procedure: RIGHT L2-3 LATERAL RECESS DECOMPRESSION, BILATERAL L3-4 LATERAL RECESS DECOMPRESSION, BILATERAL L4 FORAMINOTOMY;  Surgeon: Jessy Oto, MD;  Location: [facility];  Service: Orthopedics;  Laterality: N/A;  . removal of laparoscopic gastric band  01/02/10   port removed prior to this surgery  . ROTATOR CUFF REPAIR     bilateral  . TONSILLECTOMY    . 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 vertical sleeve gastrectomy N/A    Social History   Occupational History  . 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 Smoking status: Never Smoker  . Smokeless tobacco: Never Used  . Alcohol use No  . Drug use: No  . Sexual activity: Yes    Birth control/ protection: Surgical

## 2016-01-23 IMAGING — CR DG LUMBAR SPINE 2-3V
2 series · 2 of 2 positions shown · non-contrast
Comparison: MRI 06/08/2014

CLINICAL DATA: Lumbar decompression.

EXAM:
LUMBAR SPINE - 2-3 VIEW

[lat (1 of 2)]
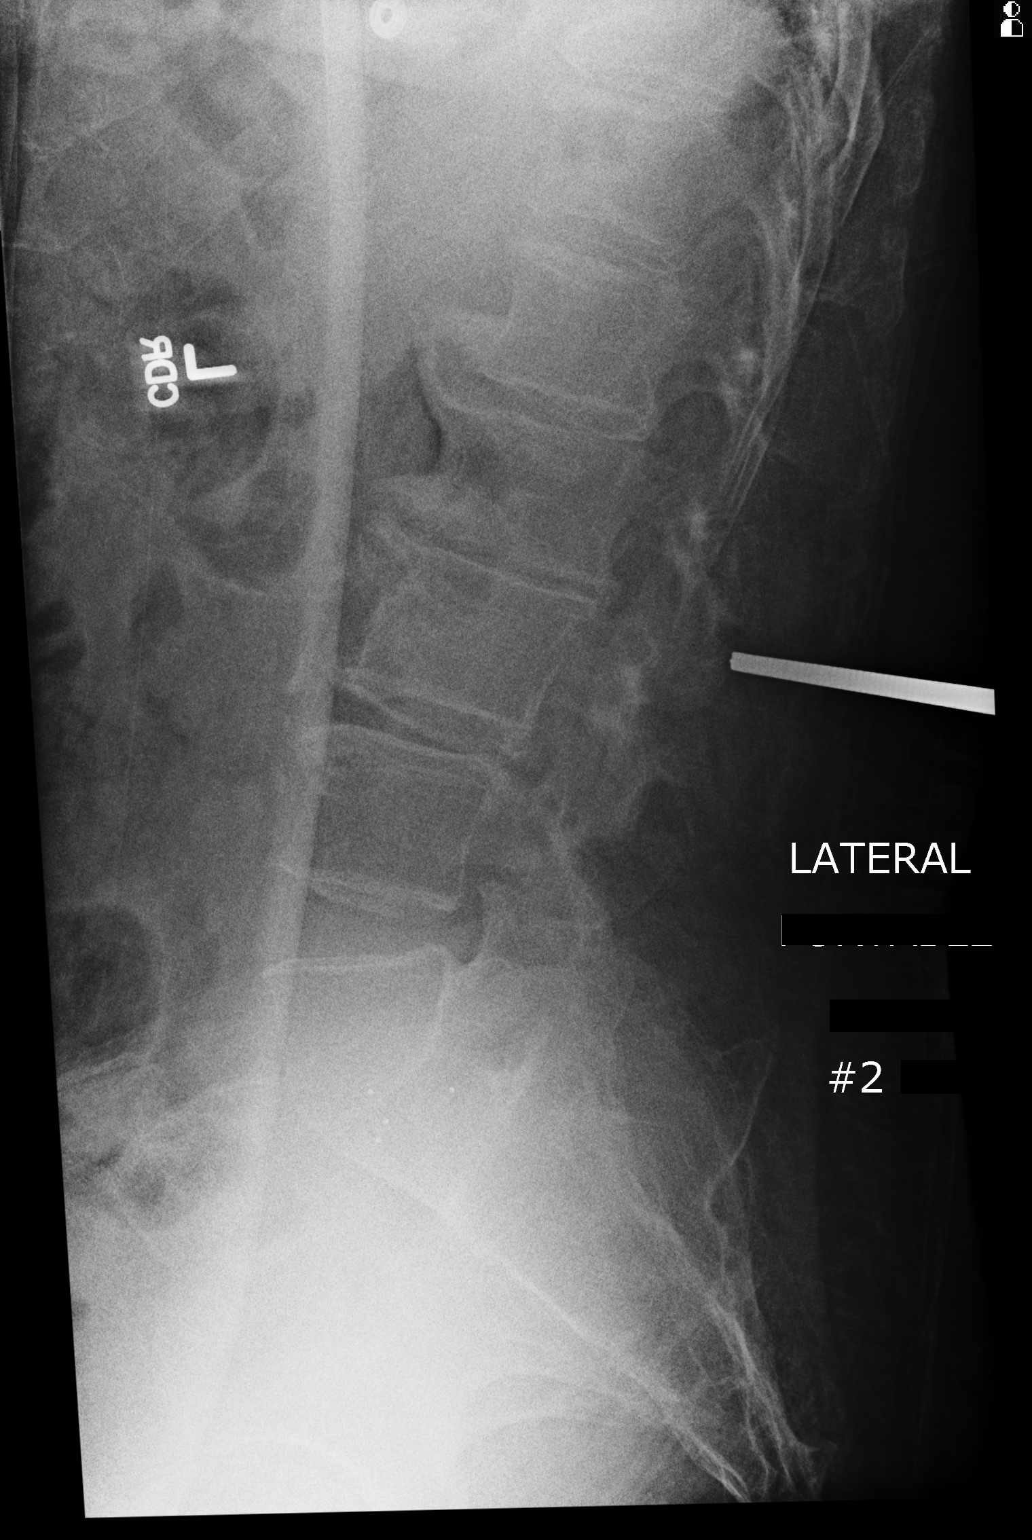

[lat (2 of 2)]
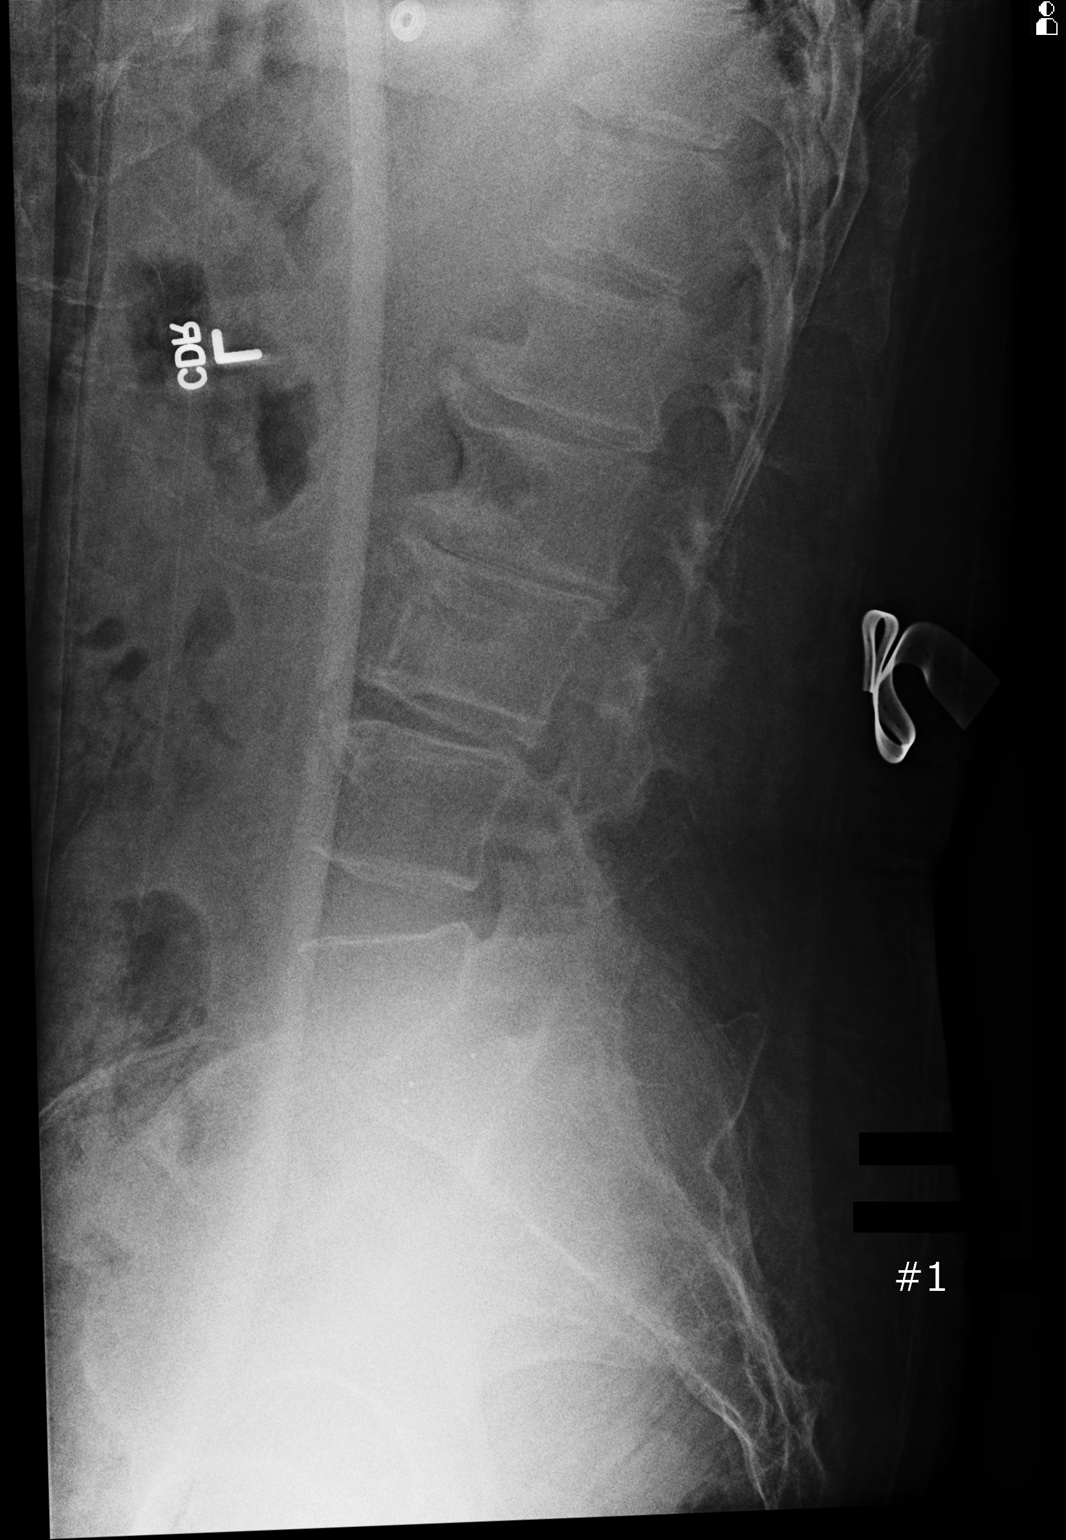

[2 of 2 positions shown; findings below may reference images not displayed]

FINDINGS: Vertebral body alignment and heights are within normal. There is
moderate spondylosis throughout the lumbar spine. There is
multilevel disc space narrowing most prominent at the L2-3 level
with sparing of the L4-5 level. In intervertebral disc spacer is
present at the L5-S1 level. Evidence of previous laminectomy over
the lower lumbar spine. No compression fracture or subluxation.
Subsequent image demonstrates a surgical instrument with tip over
the posterior soft tissues at the L2-3 level.
IMPRESSION: Model spondylosis of the lumbar spine with multilevel disc disease
worse at the L2-3 level. Surgical instrument over the posterior soft
tissues at the L2-3 level.

## 2016-03-26 ENCOUNTER — Other Ambulatory Visit: Payer: Self-pay | Admitting: Nurse Practitioner

## 2016-03-30 ENCOUNTER — Encounter (HOSPITAL_COMMUNITY): Payer: Self-pay | Admitting: Emergency Medicine

## 2016-03-30 ENCOUNTER — Emergency Department (HOSPITAL_COMMUNITY)
Admission: EM | Admit: 2016-03-30 | Discharge: 2016-03-30 | Disposition: A | Discharge status: Home / Self Care | Payer: BC Managed Care – PPO | Attending: Emergency Medicine | Admitting: Emergency Medicine

## 2016-03-30 DIAGNOSIS — S61213A Laceration without foreign body of left middle finger without damage to nail, initial encounter: Secondary | ICD-10-CM | POA: Diagnosis not present

## 2016-03-30 DIAGNOSIS — W272XXA Contact with scissors, initial encounter: Secondary | ICD-10-CM | POA: Insufficient documentation

## 2016-03-30 DIAGNOSIS — Y999 Unspecified external cause status: Secondary | ICD-10-CM | POA: Insufficient documentation

## 2016-03-30 DIAGNOSIS — Y929 Unspecified place or not applicable: Secondary | ICD-10-CM | POA: Diagnosis not present

## 2016-03-30 DIAGNOSIS — Z79899 Other long term (current) drug therapy: Secondary | ICD-10-CM | POA: Diagnosis not present

## 2016-03-30 DIAGNOSIS — Y9389 Activity, other specified: Secondary | ICD-10-CM | POA: Diagnosis not present

## 2016-03-30 DIAGNOSIS — S61211A Laceration without foreign body of left index finger without damage to nail, initial encounter: Secondary | ICD-10-CM | POA: Diagnosis present

## 2016-03-30 DIAGNOSIS — Z23 Encounter for immunization: Secondary | ICD-10-CM | POA: Insufficient documentation

## 2016-03-30 DIAGNOSIS — S61219A Laceration without foreign body of unspecified finger without damage to nail, initial encounter: Secondary | ICD-10-CM

## 2016-03-30 MED ORDER — HYDROCODONE-ACETAMINOPHEN 5-325 MG PO TABS: 1.0000 | ORAL_TABLET | ORAL | 0 refills | Status: DC | PRN

## 2016-03-30 MED ORDER — PROMETHAZINE HCL 12.5 MG PO TABS
12.5000 mg | ORAL_TABLET | Freq: Once | ORAL | Status: AC
  Administered 2016-03-30: 12.5 mg via ORAL
  Filled 2016-03-30: qty 1

## 2016-03-30 MED ORDER — TETANUS-DIPHTH-ACELL PERTUSSIS 5-2.5-18.5 LF-MCG/0.5 IM SUSP
0.5000 mL | Freq: Once | INTRAMUSCULAR | Status: AC
  Administered 2016-03-30: 0.5 mL via INTRAMUSCULAR

## 2016-03-30 MED ORDER — TETANUS-DIPHTH-ACELL PERTUSSIS 5-2.5-18.5 LF-MCG/0.5 IM SUSP
INTRAMUSCULAR | Status: AC
  Administered 2016-03-30: 0.5 mL via INTRAMUSCULAR
  Filled 2016-03-30: qty 0.5

## 2016-03-30 MED ORDER — LIDOCAINE HCL (PF) 1 % IJ SOLN
INTRAMUSCULAR | Status: AC
  Filled 2016-03-30: qty 5

## 2016-03-30 MED ORDER — HYDROCODONE-ACETAMINOPHEN 5-325 MG PO TABS
2.0000 | ORAL_TABLET | Freq: Once | ORAL | Status: AC
Start: 2016-03-30 — End: 2016-03-30
  Administered 2016-03-30: 2 via ORAL
  Filled 2016-03-30: qty 2

## 2016-03-30 NOTE — ED Notes (Signed)
Avulsion wounds to left first and second finger tips from shears used for quilting. Unknown last tetanus, so tetanus updated during visit. Wounds cleansed with sterile saline.

## 2016-03-30 NOTE — ED Notes (Signed)
Patient with no complaints at this time. Respirations even and unlabored. Skin warm/dry. Discharge instructions reviewed with patient at this time. Patient given opportunity to voice concerns/ask questions. Patient discharged at this time and left Emergency Department with steady gait.   

## 2016-03-30 NOTE — Discharge Instructions (Signed)
The avulsion/laceration of your fingers was treated with surgical clot. Please leave this dressing on until Monday night, January 29. Please cleanse the wound gently with soap and water, and apply a bandage daily. Please see your primary physician or return to the emergency department if any signs of advancing infection.

## 2016-03-30 NOTE — ED Provider Notes (Signed)
[facility] DEPT Provider Note   CSN: [id number] Arrival date & time: 03/30/16  1205     History   Chief Complaint Chief Complaint  Patient presents with  . Extremity Laceration    2,3 finger cut with avulsion    HPI Natalie Dodson is a 66 y.o. female.  Patient is a 66 year old female who presents to the emergency department with injury to the left index and long fingers.  The patient states that she was quilting, she was using  sharp quilting shears. She accidentally cut her fingers. She noticed the distal tips were on her quilting table. She attempted to cleanse the wounds on. And she presents now to the emergency department. The patient is unsure of her last tetanus shot. She denies being on any anticoagulation medications. She has no history of bleeding disorder.      Past Medical History:  Diagnosis Date  . Arthritis   . Chronic diarrhea    d/t lap band  . Fibromyalgia   . GERD (gastroesophageal reflux disease)   . MRSA (methicillin resistant staph aureus) culture positive 11-06-11   cultured from back surgery hardware-hardware removed  . PONV (postoperative nausea and vomiting) 11-06-11   nausea with anesthesia    Patient Active Problem List   Diagnosis Date Noted  . Vitamin D deficiency 10/27/2015  . Spinal stenosis in cervical region 12/05/2014    Class: Chronic  . Stenosis of cervical spine 12/05/2014  . Spinal stenosis, lumbar region, with neurogenic claudication 08/26/2014  . Radiculopathy of lumbosacral region 08/05/2014  . Adjustment disorder with anxious mood 02/23/2014  . Fibromyalgia 02/21/2014  . Osteopenia 08/21/2012  . Diarrhea 11/11/2010  . Morbid obesity ([place]) 10/31/2010    Past Surgical History:  Procedure Laterality Date  . ABDOMINAL HYSTERECTOMY     then ovarian removal  . ANTERIOR CERVICAL DECOMP/DISCECTOMY FUSION N/A 12/05/2014   Procedure: C4 Corpectomy with decompression C3-4 and C4-5 neuroforamen, allograft, ilac crest bone graft,  cervical plate and screws;  Surgeon: Jessy Oto, MD;  Location: [facility];  Service: Orthopedics;  Laterality: N/A;  . APPENDECTOMY    . BACK SURGERY     X6  . BILATERAL OOPHORECTOMY    . CARPAL TUNNEL RELEASE     right [place]  . CERVICAL FUSION    . COLONOSCOPY N/A 08/18/2012   RMR: Rectal polyp-removed. Colonic diverticulosis s/p segmental biopsies and stool sampling  . ELBOW SURGERY  11-06-11   bilateral "tendon release"  . HERNIA REPAIR     umbilical  . LAPAROSCOPIC GASTRIC BANDING  12/12/08  . LUMBAR LAMINECTOMY/DECOMPRESSION MICRODISCECTOMY N/A 08/26/2014   Procedure: RIGHT L2-3 LATERAL RECESS DECOMPRESSION, BILATERAL L3-4 LATERAL RECESS DECOMPRESSION, BILATERAL L4 FORAMINOTOMY;  Surgeon: Jessy Oto, MD;  Location: [facility];  Service: Orthopedics;  Laterality: N/A;  . removal of laparoscopic gastric band  01/02/10   port removed prior to this surgery  . ROTATOR CUFF REPAIR     bilateral  . TONSILLECTOMY    . 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 vertical sleeve gastrectomy N/A     OB History    No data available       Home Medications    Prior to Admission medications   Medication Sig Start Date End Date Taking? Authorizing Provider  ALPRAZolam Duanne Moron) 0.5 MG tablet One po BID prn 03/26/16  Yes Nilda Simmer, NP  Calcium Carbonate (CALCIUM 600 PO) Take by mouth. 2 a day   Yes Historical Provider, MD  Cholecalciferol (  VITAMIN D PO) Take by mouth. Vit d3 2,000 units daily   Yes Historical Provider, MD  citalopram (CELEXA) 20 MG tablet Take 1 tablet (20 mg total) by mouth daily. 12/26/15  Yes Kathyrn Drown, MD  esomeprazole (NEXIUM) 40 MG capsule Take 40 mg by mouth 2 (two) times daily as needed (for acid reflux).  06/29/14  Yes Historical Provider, MD  gabapentin (NEURONTIN) 300 MG capsule Take 1 capsule (300 mg total) by mouth 3 (three) times daily. 12/20/15  Yes Nilda Simmer, NP  Multiple Vitamin (MULTIVITAMIN) tablet Take 1 tablet by mouth daily. 2 a day   Yes  Historical Provider, MD  amoxicillin-clavulanate (AUGMENTIN) 875-125 MG tablet Take 1 tablet by mouth 2 (two) times daily. 12/26/15   Kathyrn Drown, MD  cephALEXin (KEFLEX) 500 MG capsule Take 1 tablet by mouth three times a day for 10 days 01/03/16   Mikey Kirschner, MD  methocarbamol (ROBAXIN) 500 MG tablet TAKE ONE TABLET BY MOUTH EVERY 6 HOURS AS NEEDED FOR MUSCLE SPASMS. 11/29/15   Mikey Kirschner, MD    Family History Family History  Problem Relation Age of Onset  . Colon cancer Neg Hx     Social History Social History  Substance Use Topics  . Smoking status: Never Smoker  . Smokeless tobacco: Never Used  . Alcohol use No     Allergies   Vancomycin   Review of Systems Review of Systems  Constitutional: Negative for activity change.       All ROS Neg except as noted in HPI  HENT: Negative for nosebleeds.   Eyes: Negative for photophobia and discharge.  Respiratory: Negative for cough, shortness of breath and wheezing.   Cardiovascular: Negative for chest pain and palpitations.  Gastrointestinal: Negative for abdominal pain and blood in stool.  Genitourinary: Negative for dysuria, frequency and hematuria.  Musculoskeletal: Positive for arthralgias. Negative for back pain and neck pain.  Skin: Negative.   Neurological: Negative for dizziness, seizures and speech difficulty.  Psychiatric/Behavioral: Negative for confusion and hallucinations.     Physical Exam Updated Vital Signs BP (!) 146/124 (BP Location: Right Arm)   Pulse 66   Temp 98.4 F (36.9 C) (Oral)   Resp 20   Ht 5\' 3"  (1.6 m)   Wt 72.6 kg   SpO2 98%   BMI 28.34 kg/m   Physical Exam  Constitutional: She is oriented to person, place, and time. She appears well-developed and well-nourished.  Non-toxic appearance.  HENT:  Head: Normocephalic.  Right Ear: Tympanic membrane and external ear normal.  Left Ear: Tympanic membrane and external ear normal.  Eyes: EOM and lids are normal. Pupils are  equal, round, and reactive to light.  Neck: Normal range of motion. Neck supple. Carotid bruit is not present.  Cardiovascular: Normal rate, regular rhythm, normal heart sounds, intact distal pulses and normal pulses.   Pulmonary/Chest: Breath sounds normal. No respiratory distress.  Abdominal: Soft. Bowel sounds are normal. There is no tenderness. There is no guarding.  Musculoskeletal: Normal range of motion.       Hands: Lymphadenopathy:       Head (right side): No submandibular adenopathy present.       Head (left side): No submandibular adenopathy present.    She has no cervical adenopathy.  Neurological: She is alert and oriented to person, place, and time. She has normal strength. No cranial nerve deficit or sensory deficit.  Skin: Skin is warm and dry.  Psychiatric: She has a  normal mood and affect. Her speech is normal.  Nursing note and vitals reviewed.    ED Treatments / Results  Labs (all labs ordered are listed, but only abnormal results are displayed) Labs Reviewed - No data to display  EKG  EKG Interpretation None       Radiology No results found.  Procedures Procedures (including critical care time) LACERATION REPAIR Patient sustained a laceration/avulsion of the tips of the index finger and the long finger of the left [place].  I discussed with the patient the need for evaluation of the lacerations, and repair of the same. The patient is in agreement, and gives permission for the procedure.  The patient identified by arm band. The wounds were cleansed on gently with soap and water. They were then cleansed with safe cleanse. The wounds were inspected. The patient has laceration/avulsion of the soft tissue tip of the index finger and the long finger of the left [place]. There is no bone involvement. No tendon involvement. The bleeding has been stopped by applying pressure directly. The areas were treated with surgery clot and a sterile Kling dressing was applied.  Patient tolerated the procedure without problem. Patient will use Norco for pain not controlled by Tylenol or ibuprofen. Medications Ordered in ED Medications  lidocaine (PF) (XYLOCAINE) 1 % injection (not administered)  Tdap (BOOSTRIX) injection 0.5 mL (0.5 mLs Intramuscular Given 03/30/16 1252)     Initial Impression / Assessment and Plan / ED Course  I have reviewed the triage vital signs and the nursing notes.  Pertinent labs & imaging results that were available during my care of the patient were reviewed by me and considered in my medical decision making (see chart for details).     **I have reviewed nursing notes, vital signs, and all appropriate lab and imaging results for this patient.*  Final Clinical Impressions(s) / ED Diagnoses  The patient has a soft tissue laceration/avulsion of the tip of the index finger and the long finger. The area was cleansed and then treated with surgery clot. I've instructed the patient to keep her [place] elevated as much as possible. She will use Tylenol and ibuprofen for mild pain, she will use Norco for more severe pain. We discussed the importance of returning immediately if any signs of advancing infection. The patient acknowledges these instructions and is in agreement.    Final diagnoses:  None    New Prescriptions New Prescriptions   No medications on file     Lily Kocher, PA-C 03/30/16 [place], MD 03/30/16 1525

## 2016-05-03 ENCOUNTER — Encounter: Payer: Self-pay | Admitting: Nurse Practitioner

## 2016-05-03 ENCOUNTER — Other Ambulatory Visit: Payer: Self-pay | Admitting: Nurse Practitioner

## 2016-05-03 ENCOUNTER — Ambulatory Visit (INDEPENDENT_AMBULATORY_CARE_PROVIDER_SITE_OTHER): Payer: BC Managed Care – PPO | Admitting: Nurse Practitioner

## 2016-05-03 VITALS — BP 128/84 | Ht 62.25 in | Wt 172.0 lb

## 2016-05-03 DIAGNOSIS — Z1231 Encounter for screening mammogram for malignant neoplasm of breast: Secondary | ICD-10-CM

## 2016-05-03 DIAGNOSIS — Z13220 Encounter for screening for lipoid disorders: Secondary | ICD-10-CM | POA: Diagnosis not present

## 2016-05-03 DIAGNOSIS — E559 Vitamin D deficiency, unspecified: Secondary | ICD-10-CM | POA: Diagnosis not present

## 2016-05-03 DIAGNOSIS — Z0000 Encounter for general adult medical examination without abnormal findings: Secondary | ICD-10-CM

## 2016-05-03 DIAGNOSIS — Z79899 Other long term (current) drug therapy: Secondary | ICD-10-CM | POA: Diagnosis not present

## 2016-05-03 NOTE — Patient Instructions (Signed)
[facility]

## 2016-05-04 ENCOUNTER — Encounter: Payer: Self-pay | Admitting: Nurse Practitioner

## 2016-05-04 LAB — LIPID PANEL
Chol/HDL Ratio: 3.4 ratio units (ref 0.0–4.4)
Cholesterol, Total: 177 mg/dL (ref 100–199)
HDL: 52 mg/dL (ref >39)
LDL Calculated: 112 mg/dL — ABNORMAL HIGH (ref 0–99)
Triglycerides: 65 mg/dL (ref 0–149)
VLDL Cholesterol Cal: 13 mg/dL (ref 5–40)

## 2016-05-04 LAB — BASIC METABOLIC PANEL
BUN / CREAT RATIO: 16 (ref 12–28)
BUN: 13 mg/dL (ref 8–27)
CO2: 24 mmol/L (ref 18–29)
CREATININE: 0.82 mg/dL (ref 0.57–1.00)
Calcium: 8.5 mg/dL — ABNORMAL LOW (ref 8.7–10.3)
Chloride: 104 mmol/L (ref 96–106)
GFR calc Af Amer: 88 mL/min/{1.73_m2} (ref >59)
GFR, EST NON AFRICAN AMERICAN: 76 mL/min/{1.73_m2} (ref >59)
Glucose: 90 mg/dL (ref 65–99)
Potassium: 4.1 mmol/L (ref 3.5–5.2)
SODIUM: 143 mmol/L (ref 134–144)

## 2016-05-04 LAB — HEPATIC FUNCTION PANEL
ALBUMIN: 3.6 g/dL (ref 3.6–4.8)
ALK PHOS: 112 IU/L (ref 39–117)
ALT: 11 IU/L (ref 0–32)
AST: 20 IU/L (ref 0–40)
BILIRUBIN, DIRECT: 0.15 mg/dL (ref 0.00–0.40)
Bilirubin Total: 0.4 mg/dL (ref 0.0–1.2)
TOTAL PROTEIN: 6.4 g/dL (ref 6.0–8.5)

## 2016-05-04 LAB — VITAMIN D 25 HYDROXY (VIT D DEFICIENCY, FRACTURES): Vit D, 25-Hydroxy: 102 ng/mL — ABNORMAL HIGH (ref 30.0–100.0)

## 2016-05-04 NOTE — Progress Notes (Signed)
   Subjective:    Patient ID: Natalie Dodson, female    DOB: 12-11-1950, 66 y.o.   MRN: [id number]  HPI presents for her wellness exam. Same sexual partner. Regular dental exams. Needs vision exam. Occasional walking for activity. Takes daily vitamin D and calcium. Has had shingles vaccine.     Review of Systems  Constitutional: Negative for activity change, appetite change and fatigue.  HENT: Negative for dental problem, ear pain, sinus pressure and sore throat.   Respiratory: Negative for cough, chest tightness, shortness of breath and wheezing.   Cardiovascular: Negative for chest pain.  Gastrointestinal: Negative for abdominal distention, abdominal pain, constipation, diarrhea, nausea and vomiting.  Genitourinary: Negative for difficulty urinating, dysuria, enuresis, frequency, genital sores, pelvic pain, urgency and vaginal discharge.       Objective:   Physical Exam  Constitutional: She is oriented to person, place, and time. She appears well-developed. No distress.  HENT:  Right Ear: External ear normal.  Left Ear: External ear normal.  Mouth/Throat: Oropharynx is clear and moist.  Neck: Normal range of motion. Neck supple. No tracheal deviation present. No thyromegaly present.  Cardiovascular: Normal rate, regular rhythm and normal heart sounds.  Exam reveals no gallop.   No murmur heard. Pulmonary/Chest: Effort normal and breath sounds normal.  Abdominal: Soft. She exhibits no distension. There is no tenderness.  Genitourinary:  Genitourinary Comments: Recommend pelvic exam but patient defers. Denies any problems.   Musculoskeletal: She exhibits no edema.  Lymphadenopathy:    She has no cervical adenopathy.  Neurological: She is alert and oriented to person, place, and time.  Skin: Skin is warm and dry. No rash noted.  Psychiatric: She has a normal mood and affect. Her behavior is normal.  Vitals reviewed. Breast exam: no masses; axillae no adenopathy.           Assessment & Plan:   Problem List Items Addressed This Visit      Other   Vitamin D deficiency   Relevant Orders   VITAMIN D 25 Hydroxy (Vit-D Deficiency, Fractures) (Completed)    Other Visit Diagnoses    Routine general medical examination at a health care facility    -  Primary   Encounter for screening mammogram for malignant neoplasm of breast       Relevant Orders   MM DIGITAL SCREENING BILATERAL   Screening, lipid       Relevant Orders   Lipid panel (Completed)   High risk medication use       Relevant Orders   Hepatic function panel (Completed)   Basic metabolic panel (Completed)     Recommend regular activity such as walking. Mammogram scheduled. Recommend eye exam. Labs pending.  Return in about 6 months (around 11/03/2016) for follow up.

## 2016-05-14 ENCOUNTER — Encounter: Payer: Self-pay | Admitting: Nurse Practitioner

## 2016-05-16 ENCOUNTER — Telehealth (INDEPENDENT_AMBULATORY_CARE_PROVIDER_SITE_OTHER): Payer: Self-pay | Admitting: Specialist

## 2016-05-16 ENCOUNTER — Ambulatory Visit (HOSPITAL_COMMUNITY)
Admission: RE | Admit: 2016-05-16 | Discharge: 2016-05-16 | Disposition: A | Discharge status: Home / Self Care | Payer: BC Managed Care – PPO | Source: Ambulatory Visit | Attending: Nurse Practitioner | Admitting: Nurse Practitioner

## 2016-05-16 ENCOUNTER — Other Ambulatory Visit: Payer: Self-pay | Admitting: Nurse Practitioner

## 2016-05-16 DIAGNOSIS — Z1231 Encounter for screening mammogram for malignant neoplasm of breast: Secondary | ICD-10-CM | POA: Insufficient documentation

## 2016-05-16 NOTE — Telephone Encounter (Signed)
FAXED RECORDS 09-02-2015 TO PRESENT TO THE [facility] CLAIM [phone]

## 2016-06-11 ENCOUNTER — Encounter (HOSPITAL_COMMUNITY): Payer: Self-pay

## 2016-06-17 ENCOUNTER — Ambulatory Visit (INDEPENDENT_AMBULATORY_CARE_PROVIDER_SITE_OTHER): Payer: BC Managed Care – PPO | Admitting: Specialist

## 2016-07-10 ENCOUNTER — Ambulatory Visit (INDEPENDENT_AMBULATORY_CARE_PROVIDER_SITE_OTHER): Payer: BC Managed Care – PPO | Admitting: Specialist

## 2016-08-19 ENCOUNTER — Encounter: Payer: Self-pay | Admitting: Nurse Practitioner

## 2016-08-29 ENCOUNTER — Ambulatory Visit (INDEPENDENT_AMBULATORY_CARE_PROVIDER_SITE_OTHER): Payer: BC Managed Care – PPO | Admitting: Specialist

## 2016-08-29 ENCOUNTER — Encounter (INDEPENDENT_AMBULATORY_CARE_PROVIDER_SITE_OTHER): Payer: Self-pay | Admitting: Specialist

## 2016-08-29 ENCOUNTER — Ambulatory Visit (INDEPENDENT_AMBULATORY_CARE_PROVIDER_SITE_OTHER): Payer: BC Managed Care – PPO

## 2016-08-29 VITALS — BP 159/81 | HR 57 | Ht 63.0 in | Wt 160.0 lb

## 2016-08-29 DIAGNOSIS — M546 Pain in thoracic spine: Secondary | ICD-10-CM | POA: Diagnosis not present

## 2016-08-29 DIAGNOSIS — M4816 Ankylosing hyperostosis [Forestier], lumbar region: Secondary | ICD-10-CM | POA: Diagnosis not present

## 2016-08-29 DIAGNOSIS — M545 Low back pain: Secondary | ICD-10-CM

## 2016-08-29 DIAGNOSIS — M5136 Other intervertebral disc degeneration, lumbar region: Secondary | ICD-10-CM | POA: Diagnosis not present

## 2016-08-29 MED ORDER — OXAPROZIN 600 MG PO TABS: 600.0000 mg | ORAL_TABLET | Freq: Every day | ORAL | 3 refills | Status: DC

## 2016-08-29 NOTE — Patient Instructions (Signed)
Avoid frequent bending and stooping  No lifting greater than 10 lbs. May use ice or moist heat for pain. Weight loss is of benefit. Handicap license is approved.   

## 2016-08-29 NOTE — Progress Notes (Signed)
Office Visit Note   Patient: Natalie Dodson           Date of Birth: 1950/10/16           MRN: [id number] Visit Date: 08/29/2016              Requested by: Mikey Kirschner, [address], [place] [address] PCP: Mikey Kirschner, MD   Assessment & Plan: Visit Diagnoses:  1. Low back pain, unspecified back pain laterality, unspecified chronicity, with sciatica presence unspecified   2. Forestier's disease of lumbar region   3. Degenerative disc disease, lumbar   4. Pain in thoracic spine     Plan:Avoid frequent bending and stooping  No lifting greater than 10 lbs. May use ice or moist heat for pain. Weight loss is of benefit. Handicap license is approved   Follow-Up Instructions: Return in about 3 weeks (around 09/19/2016).   Orders:  Orders Placed This Encounter  Procedures  . XR Lumbar Spine 2-3 Views  . MR Thoracic Spine w/o contrast  . Ambulatory referral to Neurosurgery   Meds ordered this encounter  Medications  . oxaprozin (DAYPRO) 600 MG tablet    Sig: Take 1 tablet (600 mg total) by mouth daily.    Dispense:  30 tablet    Refill:  3      Procedures: No procedures performed   Clinical Data: No additional findings.   Subjective: Chief Complaint  Patient presents with  . Neck - Follow-up  . Lower Back - Pain    66 year old female with cervical and lumbar DDD and spondylosis, status post cervical spine corpectomy C4 with C3 to C5 fusion and lumbar decompression and fusion L4-5 and decompression right L2-3 and L3-4.  Previous lumbar decompression and fusion done in 3545 was complicated with infection with MRSA.  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 Pain in her back with radiation into the right leg right buttock with radiation into the right thigh and leg below the knee laterally. Right and left arm with tingling, no leg numbness or tingling. No bowel or bladder concerns. Post last sleeve surgery with Gi  difficulty since starting pain meds the stomach is okay with less diarrhea. She is seeing Dr. Greta Doom for pain management and has received injections of the cervical spine (facets recently).        Review of Systems  Constitutional: Negative.   HENT: Negative.   Eyes: Negative.   Respiratory: Negative.   Cardiovascular: Negative.   Gastrointestinal: Negative.   Endocrine: Negative.   Genitourinary: Negative.   Musculoskeletal: Positive for back pain, neck pain and neck stiffness.  Skin: Negative.   Allergic/Immunologic: Negative.   Neurological: Positive for numbness.  Hematological: Negative.   Psychiatric/Behavioral: Negative.      Objective: Vital Signs: BP (!) 159/81 (BP Location: Left Arm, Patient Position: Sitting)   Pulse (!) 57   Ht 5\' 3"  (1.6 m)   Wt 160 lb (72.6 kg)   BMI 28.34 kg/m   Physical Exam  Constitutional: She is oriented to person, place, and time. She appears well-developed and well-nourished.  HENT:  Head: Normocephalic and atraumatic.  Eyes: EOM are normal. Pupils are equal, round, and reactive to light.  Neck: Normal range of motion. Neck supple.  Pulmonary/Chest: Effort normal and breath sounds normal.  Abdominal: Soft. Bowel sounds are normal.  Neurological: She is alert and oriented to person, place, and time.  Skin: Skin is  warm and dry.  Psychiatric: She has a normal mood and affect. Her behavior is normal. Judgment and thought content normal.    Back Exam   Tenderness  The patient is experiencing tenderness in the lumbar.  Range of Motion  Extension: abnormal  Flexion: abnormal  Lateral Bend Right: abnormal  Lateral Bend Left: abnormal  Rotation Right: abnormal  Rotation Left: abnormal   Muscle Strength  Right Quadriceps:  5/5  Left Quadriceps:  5/5  Right Hamstrings:  5/5  Left Hamstrings:  5/5   Tests  Straight leg raise right: negative Straight leg raise left: negative  Reflexes  Babinski's sign: normal        Specialty Comments:  No specialty comments available.  Imaging: No results found.   PMFS History: Patient Active Problem List   Diagnosis Date Noted  . Spinal stenosis in cervical region 12/05/2014    Priority: High    Class: Chronic  . Vitamin D deficiency 10/27/2015  . Stenosis of cervical spine 12/05/2014  . Spinal stenosis, lumbar region, with neurogenic claudication 08/26/2014  . Radiculopathy of lumbosacral region 08/05/2014  . Adjustment disorder with anxious mood 02/23/2014  . Fibromyalgia 02/21/2014  . Osteopenia 08/21/2012  . Diarrhea 11/11/2010  . Morbid obesity ([place]) 10/31/2010   Past Medical History:  Diagnosis Date  . Arthritis   . Chronic diarrhea    d/t lap band  . Fibromyalgia   . GERD (gastroesophageal reflux disease)   . MRSA (methicillin resistant staph aureus) culture positive 11-06-11   cultured from back surgery hardware-hardware removed  . PONV (postoperative nausea and vomiting) 11-06-11   nausea with anesthesia    Family History  Problem Relation Age of Onset  . Colon cancer Neg Hx     Past Surgical History:  Procedure Laterality Date  . ABDOMINAL HYSTERECTOMY     then ovarian removal  . ANTERIOR CERVICAL DECOMP/DISCECTOMY FUSION N/A 12/05/2014   Procedure: C4 Corpectomy with decompression C3-4 and C4-5 neuroforamen, allograft, ilac crest bone graft, cervical plate and screws;  Surgeon: Jessy Oto, MD;  Location: [facility];  Service: Orthopedics;  Laterality: N/A;  . APPENDECTOMY    . BACK SURGERY     X6  . BILATERAL OOPHORECTOMY    . CARPAL TUNNEL RELEASE     right [place]  . CERVICAL FUSION    . COLONOSCOPY N/A 08/18/2012   RMR: Rectal polyp-removed. Colonic diverticulosis s/p segmental biopsies and stool sampling  . ELBOW SURGERY  11-06-11   bilateral "tendon release"  . HERNIA REPAIR     umbilical  . LAPAROSCOPIC GASTRIC BANDING  12/12/08  . LUMBAR LAMINECTOMY/DECOMPRESSION MICRODISCECTOMY N/A 08/26/2014   Procedure: RIGHT L2-3  LATERAL RECESS DECOMPRESSION, BILATERAL L3-4 LATERAL RECESS DECOMPRESSION, BILATERAL L4 FORAMINOTOMY;  Surgeon: Jessy Oto, MD;  Location: [facility];  Service: Orthopedics;  Laterality: N/A;  . removal of laparoscopic gastric band  01/02/10   port removed prior to this surgery  . ROTATOR CUFF REPAIR     bilateral  . TONSILLECTOMY    . 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 vertical sleeve gastrectomy N/A    Social History   Occupational History  . 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 Smoking status: Never Smoker  . Smokeless tobacco: Never Used  . Alcohol use No  . Drug use: No  . Sexual activity: Yes    Birth control/ protection: Surgical

## 2016-09-16 ENCOUNTER — Ambulatory Visit
Admission: RE | Admit: 2016-09-16 | Discharge: 2016-09-16 | Disposition: A | Discharge status: Home / Self Care | Payer: BC Managed Care – PPO | Source: Ambulatory Visit | Attending: Specialist | Admitting: Specialist

## 2016-09-16 DIAGNOSIS — M4816 Ankylosing hyperostosis [Forestier], lumbar region: Secondary | ICD-10-CM

## 2016-09-16 DIAGNOSIS — M545 Low back pain: Secondary | ICD-10-CM

## 2016-09-19 ENCOUNTER — Ambulatory Visit (INDEPENDENT_AMBULATORY_CARE_PROVIDER_SITE_OTHER): Payer: BC Managed Care – PPO | Admitting: Specialist

## 2016-09-19 ENCOUNTER — Encounter (INDEPENDENT_AMBULATORY_CARE_PROVIDER_SITE_OTHER): Payer: Self-pay | Admitting: Specialist

## 2016-09-19 VITALS — BP 151/51 | HR 60 | Ht 63.0 in | Wt 160.0 lb

## 2016-09-19 DIAGNOSIS — M4816 Ankylosing hyperostosis [Forestier], lumbar region: Secondary | ICD-10-CM

## 2016-09-19 DIAGNOSIS — M4726 Other spondylosis with radiculopathy, lumbar region: Secondary | ICD-10-CM | POA: Diagnosis not present

## 2016-09-19 DIAGNOSIS — M545 Low back pain: Secondary | ICD-10-CM

## 2016-09-19 DIAGNOSIS — M5124 Other intervertebral disc displacement, thoracic region: Secondary | ICD-10-CM | POA: Diagnosis not present

## 2016-09-19 DIAGNOSIS — M4722 Other spondylosis with radiculopathy, cervical region: Secondary | ICD-10-CM

## 2016-09-19 MED ORDER — OXAPROZIN 600 MG PO TABS: ORAL_TABLET | ORAL | 3 refills | Status: DC

## 2016-09-19 NOTE — Patient Instructions (Addendum)
Avoid frequent bending and stooping  No lifting greater than 10 lbs. May use ice or moist heat for pain. Weight loss is of benefit. Handicap license is approved. Walking in a swimming pool or on level ground would be one of the best ways to burn calories. May tke the daypro up to BID but the least amount is the best as far as risk of side effects, stomach upset of renal or liver irritation. See Dr. Greta Doom and Dr. Trenton Gammon and we will follow and help were you may wish intervention.

## 2016-09-19 NOTE — Progress Notes (Signed)
Office Visit Note   Patient: Natalie Dodson           Date of Birth: 18-Aug-1950           MRN: [id number] Visit Date: 09/19/2016              Requested by: Mikey Kirschner, [address] [place], [place] [address] PCP: Mikey Kirschner, MD   Assessment & Plan: Visit Diagnoses:  1. Other spondylosis with radiculopathy, cervical region   2. Other spondylosis with radiculopathy, lumbar region   3. 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 Low back pain, unspecified back pain laterality, unspecified chronicity, with sciatica presence unspecified   5. Forestier's disease of lumbar region     Plan: Avoid frequent bending and stooping  No lifting greater than 10 lbs. May use ice or moist heat for pain. Weight loss is of benefit. Handicap license is approved. Walking in a swimming pool or on level ground would be one of the best ways to burn calories. May tke the daypro up to BID but the least amount is the best as far as risk of side effects, stomach upset of renal or liver irritation. See Dr. Greta Doom and Dr. Trenton Gammon and we will follow and help were you may wish intervention.  Follow-Up Instructions: Return in about 6 weeks (around 10/31/2016).   Orders:  No orders of the defined types were placed in this encounter.  Meds ordered this encounter  Medications  . oxaprozin (DAYPRO) 600 MG tablet    Sig: Take one tablet BID with a meal or a snack.    Dispense:  60 tablet    Refill:  3      Procedures: No procedures performed   Clinical Data: No additional findings.   Subjective: Chief Complaint  Patient presents with  . Middle Back - Follow-up    MRI Review    66 year old female right handed with history of C4-5 and C5-6 ACDF. She also has a degenerative scoliosis and severe degenerative disc changes in the lumbar spine and has had decompressive laminectomies in the past for localized areas of stenosis and a remote L5-S1  fusion for spondylolisthesis complicated by a MRSA in 2006. . Now the pain that is bilateral at the lower ribs is the most significant and she has had a recent MRI of the thoracic spine. She returns for discussion and further assessment. Her pain management specialist Dr. Greta Doom is going to perform upper extremity EMG/NCVs for assessment of the carpal tunnel and to assess for any cervical radiculopathy.She has an appoinment to see Dr. Deri Fuelling for assessment of the findings at the lower thoracic and whether a surgical solution may be of benefit.    Review of Systems  Constitutional: Negative.   HENT: Negative.   Eyes: Negative.   Respiratory: Negative.   Cardiovascular: Negative.   Gastrointestinal: Negative.   Endocrine: Negative.   Genitourinary: Negative.   Musculoskeletal: Positive for back pain, neck pain and neck stiffness.  Skin: Negative.   Allergic/Immunologic: Negative.   Neurological: Positive for numbness.  Hematological: Negative.   Psychiatric/Behavioral: Negative.      Objective: Vital Signs: BP (!) 151/51 (BP Location: Left Arm, Patient Position: Sitting)   Pulse 60   Ht 5\' 3"  (1.6 m)   Wt 160 lb (72.6 kg)   BMI 28.34 kg/m   Physical Exam  Constitutional: She is oriented to person, place, and time. She appears  well-developed and well-nourished.  HENT:  Head: Normocephalic and atraumatic.  Left Ear: External ear normal.  Eyes: Pupils are equal, round, and reactive to light. EOM are normal. Right eye exhibits no discharge. Left eye exhibits no discharge.  Neck: Normal range of motion. Neck supple. No JVD present. No tracheal deviation present. No thyromegaly present.  Cardiovascular: Intact distal pulses.   No murmur heard. Pulmonary/Chest: Effort normal. No stridor. She has no wheezes.  Abdominal: Soft. Bowel sounds are normal. She exhibits no distension. There is no tenderness.  Neurological: She is alert and oriented to person, place, and time. A sensory  deficit is present.  Skin: Skin is warm and dry.  Psychiatric: She has a normal mood and affect. Her behavior is normal. Judgment and thought content normal.    Back Exam   Tenderness  The patient is experiencing tenderness in the cervical and lumbar.  Range of Motion  Extension: abnormal  Flexion: abnormal  Lateral Bend Right: abnormal  Lateral Bend Left: abnormal  Rotation Right: abnormal  Rotation Left: abnormal   Muscle Strength  Right Quadriceps:  5/5  Left Quadriceps:  5/5  Right Hamstrings:  5/5  Left Hamstrings:  5/5   Tests  Straight leg raise right: negative Straight leg raise left: negative  Reflexes  Patellar: 2/4 Achilles: 2/4 Babinski's sign: normal   Other  Toe Walk: normal Heel Walk: normal Sensation: normal Erythema: no back redness Scars: absent      Specialty Comments:  No specialty comments available.  Imaging: No results found.   PMFS History: Patient Active Problem List   Diagnosis Date Noted  . Spinal stenosis in cervical region 12/05/2014    Priority: High    Class: Chronic  . Vitamin D deficiency 10/27/2015  . Stenosis of cervical spine 12/05/2014  . Spinal stenosis, lumbar region, with neurogenic claudication 08/26/2014  . Radiculopathy of lumbosacral region 08/05/2014  . Adjustment disorder with anxious mood 02/23/2014  . Fibromyalgia 02/21/2014  . Osteopenia 08/21/2012  . Diarrhea 11/11/2010  . Morbid obesity ([place]) 10/31/2010   Past Medical History:  Diagnosis Date  . Arthritis   . Chronic diarrhea    d/t lap band  . Fibromyalgia   . GERD (gastroesophageal reflux disease)   . MRSA (methicillin resistant staph aureus) culture positive 11-06-11   cultured from back surgery hardware-hardware removed  . PONV (postoperative nausea and vomiting) 11-06-11   nausea with anesthesia    Family History  Problem Relation Age of Onset  . Colon cancer Neg Hx     Past Surgical History:  Procedure Laterality Date  . ABDOMINAL  HYSTERECTOMY     then ovarian removal  . ANTERIOR CERVICAL DECOMP/DISCECTOMY FUSION N/A 12/05/2014   Procedure: C4 Corpectomy with decompression C3-4 and C4-5 neuroforamen, allograft, ilac crest bone graft, cervical plate and screws;  Surgeon: Jessy Oto, MD;  Location: [facility];  Service: Orthopedics;  Laterality: N/A;  . APPENDECTOMY    . BACK SURGERY     X6  . BILATERAL OOPHORECTOMY    . CARPAL TUNNEL RELEASE     right [place]  . CERVICAL FUSION    . COLONOSCOPY N/A 08/18/2012   RMR: Rectal polyp-removed. Colonic diverticulosis s/p segmental biopsies and stool sampling  . ELBOW SURGERY  11-06-11   bilateral "tendon release"  . HERNIA REPAIR     umbilical  . LAPAROSCOPIC GASTRIC BANDING  12/12/08  . LUMBAR LAMINECTOMY/DECOMPRESSION MICRODISCECTOMY N/A 08/26/2014   Procedure: RIGHT L2-3 LATERAL RECESS DECOMPRESSION, BILATERAL L3-4 LATERAL RECESS  DECOMPRESSION, BILATERAL L4 FORAMINOTOMY;  Surgeon: Jessy Oto, MD;  Location: [facility];  Service: Orthopedics;  Laterality: N/A;  . removal of laparoscopic gastric band  01/02/10   port removed prior to this surgery  . ROTATOR CUFF REPAIR     bilateral  . TONSILLECTOMY    . 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 vertical sleeve gastrectomy N/A    Social History   Occupational History  . 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 Smoking status: Never Smoker  . Smokeless tobacco: Never Used  . Alcohol use No  . Drug use: No  . Sexual activity: Yes    Birth control/ protection: Surgical

## 2016-10-28 ENCOUNTER — Encounter: Payer: Self-pay | Admitting: Family Medicine

## 2016-10-28 ENCOUNTER — Ambulatory Visit (INDEPENDENT_AMBULATORY_CARE_PROVIDER_SITE_OTHER): Payer: BC Managed Care – PPO | Admitting: Family Medicine

## 2016-10-28 VITALS — BP 154/90 | Temp 98.5°F | Ht 63.0 in | Wt 164.0 lb

## 2016-10-28 DIAGNOSIS — R413 Other amnesia: Secondary | ICD-10-CM | POA: Diagnosis not present

## 2016-10-28 DIAGNOSIS — R109 Unspecified abdominal pain: Secondary | ICD-10-CM | POA: Diagnosis not present

## 2016-10-28 LAB — POCT URINALYSIS DIPSTICK
Spec Grav, UA: >=1.03 — AB (ref 1.010–1.025)
pH, UA: 5 (ref 5.0–8.0)

## 2016-10-28 MED ORDER — ONDANSETRON 4 MG PO TBDP: 4.0000 mg | ORAL_TABLET | Freq: Three times a day (TID) | ORAL | 0 refills | Status: DC | PRN

## 2016-10-28 NOTE — Patient Instructions (Signed)
Make sure to use xanax just st night  See if can cut gabapentin dose to one four times per day  Also full dose on the robaxin can add to confusion and memory issues and this can add to our problem, see if you can cut down may well help  Stop the hydrocodone

## 2016-10-28 NOTE — Progress Notes (Signed)
   Subjective:    Patient ID: Natalie Dodson, female    DOB: Jul 30, 1950, 66 y.o.   MRN: [id number]  Abdominal Pain  This is a new problem. Episode onset: 2 days  Associated symptoms include nausea. Associated symptoms comments: Body aches.   Memory issues for past 6 months. Failed mini cog.    Pt had some confusion last Thursday, has happened off and on the past yr  Speaking nonsensical stuff  Having difficulty operating phone now  Felt nauseated and queazzy, some diarrhea, dim appetite  surg several weeks ago , was in the hosp one day  Both legs hurt, sees pain specialist once per   neurontin thre   Review of Systems  Gastrointestinal: Positive for abdominal pain and nausea.       Objective:   Physical Exam Alert and oriented, vitals reviewed and stable, NAD ENT-TM's and ext canals WNL bilat via otoscopic exam Soft palate, tonsils and post pharynx WNL via oropharyngeal exam Neck-symmetric, no masses; thyroid nonpalpable and nontender Pulmonary-no tachypnea or accessory muscle use; Clear without wheezes via auscultation Card--no abnrml murmurs, rhythm reg and rate WNL Carotid pulses symmetric, without bruits Patient speech lucidity and understandable, oriented 3, some difficulty with short-term memory. Some difficulty with drawing clock.      Assessment & Plan:  Impression early dementia versus medication side effects, or, potentially a combination of both. Very long discussion held with the family. Plan would like to back off on Robaxin and Neurontin if at all possible. Patient family to touch base with pain specialist soon. Patient to stop hydrocodone which she is been using when necessary in addition to her frequent oxycodone.  Return for follow-up as already scheduled with [name]. At that time we'll need an MMSE, if positive for potential dementia despite cessation of some of these medications, we'll press on and do folic acid C-58 thyroid and CT scan of  brain  Greater than 50% of this 25 minute face to face visit was spent in counseling and discussion and coordination of care regarding the above diagnosis/diagnosies

## 2016-11-05 ENCOUNTER — Ambulatory Visit: Payer: BC Managed Care – PPO | Admitting: Nurse Practitioner

## 2016-11-11 ENCOUNTER — Encounter: Payer: Self-pay | Admitting: Nurse Practitioner

## 2016-11-11 ENCOUNTER — Ambulatory Visit (INDEPENDENT_AMBULATORY_CARE_PROVIDER_SITE_OTHER): Payer: BC Managed Care – PPO | Admitting: Nurse Practitioner

## 2016-11-11 VITALS — BP 122/78 | Ht 63.0 in | Wt 160.0 lb

## 2016-11-11 DIAGNOSIS — R413 Other amnesia: Secondary | ICD-10-CM

## 2016-11-11 DIAGNOSIS — Z23 Encounter for immunization: Secondary | ICD-10-CM

## 2016-11-11 DIAGNOSIS — G629 Polyneuropathy, unspecified: Secondary | ICD-10-CM | POA: Diagnosis not present

## 2016-11-12 ENCOUNTER — Encounter: Payer: Self-pay | Admitting: Nurse Practitioner

## 2016-11-12 LAB — FERRITIN: FERRITIN: 94 ng/mL (ref 15–150)

## 2016-11-12 LAB — VITAMIN B12: VITAMIN B 12: 1324 pg/mL — AB (ref 232–1245)

## 2016-11-12 NOTE — Progress Notes (Signed)
Subjective:  Presents for recheck on short-term memory issues see previous note. Much improved. Has stopped hydrocodone. Has now decreased her oxycodone use to about 2 per day. Since making these changes her memory and speech have returned to normal. Has some anxiety mainly when she's riding in the car with her husband. Questions whether she should take an increased dose of Xanax. Continues to see pain specialist. Also on Robaxin 3 times a day when necessary. Was also started on duloxetine 60 mg daily. Continues to experience some tingling in the extremities including some restless leg symptoms.  Objective:   BP 122/78   Ht 5\' 3"  (1.6 m)   Wt 160 lb (72.6 kg)   BMI 28.34 kg/m  NAD. Alert, oriented. Thoughts logical coherent and relevant. Speech clear. Testing for short-term memory intact. Dressed appropriately. Making good eye contact.  Assessment:  Short-term memory loss  Neuropathy - Plan: Vitamin B12, Ferritin  Need for vaccination - Plan: Flu Vaccine QUAD 36+ mos IM    Plan:   Meds ordered this encounter  Medications  . DULoxetine (CYMBALTA) 60 MG capsule    Sig: Take 60 mg by mouth daily.   Address some concerns about taking duloxetine and citalopram at the same time. Recommend that she start weaning off citalopram taking half of her current dose or 10 mg. Call if any problems weaning off medication. Continue to limit her pain medication. Cautioned about taking Xanax Robaxin gabapentin and oxycodone due to potential drowsiness and confusion. Explained that we will not be increasing her strength on number of Xanax while she is on opiates. Because of persistent restless leg symptoms, labs were ordered.  Return in about 6 months (around 05/11/2017) for recheck.

## 2016-11-22 ENCOUNTER — Other Ambulatory Visit: Payer: Self-pay | Admitting: Nurse Practitioner

## 2016-11-27 ENCOUNTER — Other Ambulatory Visit: Payer: Self-pay | Admitting: Neurosurgery

## 2016-11-27 DIAGNOSIS — M5412 Radiculopathy, cervical region: Secondary | ICD-10-CM

## 2016-12-07 ENCOUNTER — Ambulatory Visit
Admission: RE | Admit: 2016-12-07 | Discharge: 2016-12-07 | Disposition: A | Discharge status: Home / Self Care | Payer: BC Managed Care – PPO | Source: Ambulatory Visit | Attending: Neurosurgery | Admitting: Neurosurgery

## 2016-12-07 DIAGNOSIS — M5412 Radiculopathy, cervical region: Secondary | ICD-10-CM

## 2017-05-12 ENCOUNTER — Encounter: Payer: Self-pay | Admitting: Nurse Practitioner

## 2017-05-12 ENCOUNTER — Ambulatory Visit: Payer: BC Managed Care – PPO | Admitting: Nurse Practitioner

## 2017-05-12 VITALS — BP 150/86 | Ht 63.0 in | Wt 179.0 lb

## 2017-05-12 DIAGNOSIS — Z13220 Encounter for screening for lipoid disorders: Secondary | ICD-10-CM

## 2017-05-12 DIAGNOSIS — F419 Anxiety disorder, unspecified: Secondary | ICD-10-CM

## 2017-05-12 DIAGNOSIS — E559 Vitamin D deficiency, unspecified: Secondary | ICD-10-CM

## 2017-05-12 DIAGNOSIS — I10 Essential (primary) hypertension: Secondary | ICD-10-CM

## 2017-05-12 DIAGNOSIS — K140 Glossitis: Secondary | ICD-10-CM

## 2017-05-12 DIAGNOSIS — Z79899 Other long term (current) drug therapy: Secondary | ICD-10-CM

## 2017-05-12 DIAGNOSIS — R5383 Other fatigue: Secondary | ICD-10-CM

## 2017-05-12 MED ORDER — NYSTATIN 100000 UNIT/ML MT SUSP: 5.0000 mL | Freq: Four times a day (QID) | OROMUCOSAL | 0 refills | Status: AC

## 2017-05-12 MED ORDER — LISINOPRIL 5 MG PO TABS: 5.0000 mg | ORAL_TABLET | Freq: Every day | ORAL | 1 refills | Status: AC

## 2017-05-12 NOTE — Progress Notes (Signed)
   Subjective:    Patient ID: Natalie Dodson, female    DOB: Jul 08, 1950, 67 y.o.   MRN: [id number]  HPI Patient is here today to follow up her anxiety. She takes xanax 0.5 mg one BID prn. Continues to follow up with pain management. Took phentermine years ago without difficulty. Would like to try this again. Regular eye exams. Had recent eye surgery which is going well. No CP/ischemic type pain. Has had some soreness in her tongue, better, now mainly on the tip. Come and goes. Very sporadic. No specific triggers. Lasts 2-5 days. No fever.   Review of Systems     Objective:   Physical Exam NAD. Alert, oriented. Tongue minimal erythema. Faint white area in the middle of the tongue. No oral lesions. Pharynx clear. Neck supple with minimal adenopathy. Lungs clear. Heart RRR. Carotids no bruits or thrills. BP on recheck right arm sitting 166/90.        Assessment & Plan:   Problem List Items Addressed This Visit      Cardiovascular and Mediastinum   Essential hypertension - Primary   Relevant Medications   lisinopril (PRINIVIL,ZESTRIL) 5 MG tablet   Other Relevant Orders   Basic metabolic panel     Other   Morbid obesity ([place])   Vitamin D deficiency   Relevant Orders   TSH   VITAMIN D 25 Hydroxy (Vit-D Deficiency, Fractures)    Other Visit Diagnoses    Anxiety       Glossitis       Fatigue, unspecified type       Relevant Orders   CBC with Differential/Platelet   TSH   VITAMIN D 25 Hydroxy (Vit-D Deficiency, Fractures)   High risk medication use       Relevant Orders   Hepatic function panel   Lipid screening       Relevant Orders   Lipid panel     Meds ordered this encounter  Medications  . lisinopril (PRINIVIL,ZESTRIL) 5 MG tablet    Sig: Take 1 tablet (5 mg total) by mouth daily.    Dispense:  90 tablet    Refill:  1    Order Specific Question:   Supervising Provider    Answer:   Mikey Kirschner [[id number]]  . nystatin (MYCOSTATIN) 100000 UNIT/ML suspension   Sig: Take 5 mLs (500,000 Units total) by mouth 4 (four) times daily.    Dispense:  120 mL    Refill:  0    Order Specific Question:   Supervising Provider    Answer:   Mikey Kirschner [[id number]]   Add Lisinopril to regimen. NV in 1-2 weeks to recheck BP. Hold on Phentermine for now until BP is under better control. Recommend activity as tolerated. Very limited due to pain. DASH diet info given.  Trial of Nystatin oral suspension. Call back if oral symptoms persist.  Return in about 6 months (around 11/12/2017) for recheck. Reminded about wellness exam.  25 minutes was spent with the patient.  This statement verifies that 25 minutes was indeed spent with the patient. Greater than half the time was spent in discussion, counseling and answering questions  regarding the issues that the patient came in for today as reflected in the diagnosis (s) please refer to documentation for further details.

## 2017-05-12 NOTE — Patient Instructions (Signed)
DASH Eating Plan DASH stands for "Dietary Approaches to Stop Hypertension." The DASH eating plan is a healthy eating plan that has been shown to reduce high blood pressure (hypertension). It may also reduce your risk for type 2 diabetes, heart disease, and stroke. The DASH eating plan may also help with weight loss. What are tips for following this plan? General guidelines  Avoid eating more than 2,300 mg (milligrams) of salt (sodium) a day. If you have hypertension, you may need to reduce your sodium intake to 1,500 mg a day.  Limit alcohol intake to no more than 1 drink a day for nonpregnant women and 2 drinks a day for men. One drink equals 12 oz of beer, 5 oz of wine, or 1 oz of hard liquor.  Work with your health care provider to maintain a healthy body weight or to lose weight. Ask what an ideal weight is for you.  Get at least 30 minutes of exercise that causes your heart to beat faster (aerobic exercise) most days of the week. Activities may include walking, swimming, or biking.  Work with your health care provider or diet and nutrition specialist (dietitian) to adjust your eating plan to your individual calorie needs. Reading food labels  Check food labels for the amount of sodium per serving. Choose foods with less than 5 percent of the Daily Value of sodium. Generally, foods with less than 300 mg of sodium per serving fit into this eating plan.  To find whole grains, look for the word "whole" as the first word in the ingredient list. Shopping  Buy products labeled as "low-sodium" or "no salt added."  Buy fresh foods. Avoid canned foods and premade or frozen meals. Cooking  Avoid adding salt when cooking. Use salt-free seasonings or herbs instead of table salt or sea salt. Check with your health care provider or pharmacist before using salt substitutes.  Do not fry foods. Cook foods using healthy methods such as baking, boiling, grilling, and broiling instead.  Cook with  heart-healthy oils, such as olive, canola, soybean, or sunflower oil. Meal planning   Eat a balanced diet that includes: ? 5 or more servings of fruits and vegetables each day. At each meal, try to fill half of your plate with fruits and vegetables. ? Up to 6-8 servings of whole grains each day. ? Less than 6 oz of lean meat, poultry, or fish each day. A 3-oz serving of meat is about the same size as a deck of cards. One egg equals 1 oz. ? 2 servings of low-fat dairy each day. ? A serving of nuts, seeds, or beans 5 times each week. ? Heart-healthy fats. Healthy fats called Omega-3 fatty acids are found in foods such as flaxseeds and coldwater fish, like sardines, salmon, and mackerel.  Limit how much you eat of the following: ? Canned or prepackaged foods. ? Food that is high in trans fat, such as fried foods. ? Food that is high in saturated fat, such as fatty meat. ? Sweets, desserts, sugary drinks, and other foods with added sugar. ? Full-fat dairy products.  Do not salt foods before eating.  Try to eat at least 2 vegetarian meals each week.  Eat more home-cooked food and less restaurant, buffet, and fast food.  When eating at a restaurant, ask that your food be prepared with less salt or no salt, if possible. What foods are recommended? The items listed may not be a complete list. Talk with your dietitian about what   dietary choices are best for you. Grains Whole-grain or whole-wheat bread. Whole-grain or whole-wheat pasta. Brown rice. Oatmeal. Quinoa. Bulgur. Whole-grain and low-sodium cereals. Pita bread. Low-fat, low-sodium crackers. Whole-wheat flour tortillas. Vegetables Fresh or frozen vegetables (raw, steamed, roasted, or grilled). Low-sodium or reduced-sodium tomato and vegetable juice. Low-sodium or reduced-sodium tomato sauce and tomato paste. Low-sodium or reduced-sodium canned vegetables. Fruits All fresh, dried, or frozen fruit. Canned fruit in natural juice (without  added sugar). Meat and other protein foods Skinless chicken or turkey. Ground chicken or turkey. Pork with fat trimmed off. Fish and seafood. Egg whites. Dried beans, peas, or lentils. Unsalted nuts, nut butters, and seeds. Unsalted canned beans. Lean cuts of beef with fat trimmed off. Low-sodium, lean deli meat. Dairy Low-fat (1%) or fat-free (skim) milk. Fat-free, low-fat, or reduced-fat cheeses. Nonfat, low-sodium ricotta or cottage cheese. Low-fat or nonfat yogurt. Low-fat, low-sodium cheese. Fats and oils Soft margarine without trans fats. Vegetable oil. Low-fat, reduced-fat, or light mayonnaise and salad dressings (reduced-sodium). Canola, safflower, olive, soybean, and sunflower oils. Avocado. Seasoning and other foods Herbs. Spices. Seasoning mixes without salt. Unsalted popcorn and pretzels. Fat-free sweets. What foods are not recommended? The items listed may not be a complete list. Talk with your dietitian about what dietary choices are best for you. Grains Baked goods made with fat, such as croissants, muffins, or some breads. Dry pasta or rice meal packs. Vegetables Creamed or fried vegetables. Vegetables in a cheese sauce. Regular canned vegetables (not low-sodium or reduced-sodium). Regular canned tomato sauce and paste (not low-sodium or reduced-sodium). Regular tomato and vegetable juice (not low-sodium or reduced-sodium). Pickles. Olives. Fruits Canned fruit in a light or heavy syrup. Fried fruit. Fruit in cream or butter sauce. Meat and other protein foods Fatty cuts of meat. Ribs. Fried meat. Bacon. Sausage. Bologna and other processed lunch meats. Salami. Fatback. Hotdogs. Bratwurst. Salted nuts and seeds. Canned beans with added salt. Canned or smoked fish. Whole eggs or egg yolks. Chicken or turkey with skin. Dairy Whole or 2% milk, cream, and half-and-half. Whole or full-fat cream cheese. Whole-fat or sweetened yogurt. Full-fat cheese. Nondairy creamers. Whipped toppings.  Processed cheese and cheese spreads. Fats and oils Butter. Stick margarine. Lard. Shortening. Ghee. Bacon fat. Tropical oils, such as coconut, palm kernel, or palm oil. Seasoning and other foods Salted popcorn and pretzels. Onion salt, garlic salt, seasoned salt, table salt, and sea salt. Worcestershire sauce. Tartar sauce. Barbecue sauce. Teriyaki sauce. Soy sauce, including reduced-sodium. Steak sauce. Canned and packaged gravies. Fish sauce. Oyster sauce. Cocktail sauce. Horseradish that you find on the shelf. Ketchup. Mustard. Meat flavorings and tenderizers. Bouillon cubes. Hot sauce and Tabasco sauce. Premade or packaged marinades. Premade or packaged taco seasonings. Relishes. Regular salad dressings. Where to find more information:  National Heart, Lung, and Blood Institute: www.nhlbi.nih.gov  American Heart Association: www.heart.org Summary  The DASH eating plan is a healthy eating plan that has been shown to reduce high blood pressure (hypertension). It may also reduce your risk for type 2 diabetes, heart disease, and stroke.  With the DASH eating plan, you should limit salt (sodium) intake to 2,300 mg a day. If you have hypertension, you may need to reduce your sodium intake to 1,500 mg a day.  When on the DASH eating plan, aim to eat more fresh fruits and vegetables, whole grains, lean proteins, low-fat dairy, and heart-healthy fats.  Work with your health care provider or diet and nutrition specialist (dietitian) to adjust your eating plan to your individual   calorie needs. This information is not intended to replace advice given to you by your health care provider. Make sure you discuss any questions you have with your health care provider. Document Released: 02/07/2011 Document Revised: 02/12/2016 Document Reviewed: 02/12/2016 Elsevier Interactive Patient Education  2018 Elsevier Inc.  

## 2017-05-14 LAB — HEPATIC FUNCTION PANEL
ALBUMIN: 3.8 g/dL (ref 3.6–4.8)
ALT: 14 IU/L (ref 0–32)
AST: 22 IU/L (ref 0–40)
Alkaline Phosphatase: 126 IU/L — ABNORMAL HIGH (ref 39–117)
BILIRUBIN TOTAL: 0.2 mg/dL (ref 0.0–1.2)
Bilirubin, Direct: 0.09 mg/dL (ref 0.00–0.40)
TOTAL PROTEIN: 6.8 g/dL (ref 6.0–8.5)

## 2017-05-14 LAB — TSH: TSH: 2.95 u[IU]/mL (ref 0.450–4.500)

## 2017-05-14 LAB — CBC WITH DIFFERENTIAL/PLATELET
BASOS ABS: 0 10*3/uL (ref 0.0–0.2)
Basos: 0 %
EOS (ABSOLUTE): 0.1 10*3/uL (ref 0.0–0.4)
Eos: 2 %
HEMATOCRIT: 33 % — AB (ref 34.0–46.6)
HEMOGLOBIN: 10.5 g/dL — AB (ref 11.1–15.9)
Immature Grans (Abs): 0 10*3/uL (ref 0.0–0.1)
Immature Granulocytes: 0 %
LYMPHS ABS: 3.2 10*3/uL — AB (ref 0.7–3.1)
Lymphs: 55 %
MCH: 28.5 pg (ref 26.6–33.0)
MCHC: 31.8 g/dL (ref 31.5–35.7)
MCV: 89 fL (ref 79–97)
MONOCYTES: 7 %
Monocytes Absolute: 0.4 10*3/uL (ref 0.1–0.9)
NEUTROS ABS: 2.1 10*3/uL (ref 1.4–7.0)
Neutrophils: 36 %
Platelets: 289 10*3/uL (ref 150–379)
RBC: 3.69 x10E6/uL — ABNORMAL LOW (ref 3.77–5.28)
RDW: 15 % (ref 12.3–15.4)
WBC: 5.9 10*3/uL (ref 3.4–10.8)

## 2017-05-14 LAB — BASIC METABOLIC PANEL
BUN / CREAT RATIO: 12 (ref 12–28)
BUN: 11 mg/dL (ref 8–27)
CO2: 26 mmol/L (ref 20–29)
CREATININE: 0.94 mg/dL (ref 0.57–1.00)
Calcium: 9.4 mg/dL (ref 8.7–10.3)
Chloride: 103 mmol/L (ref 96–106)
GFR, EST AFRICAN AMERICAN: 74 mL/min/{1.73_m2} (ref >59)
GFR, EST NON AFRICAN AMERICAN: 64 mL/min/{1.73_m2} (ref >59)
GLUCOSE: 92 mg/dL (ref 65–99)
Potassium: 5.2 mmol/L (ref 3.5–5.2)
SODIUM: 143 mmol/L (ref 134–144)

## 2017-05-14 LAB — VITAMIN D 25 HYDROXY (VIT D DEFICIENCY, FRACTURES): Vit D, 25-Hydroxy: 96 ng/mL (ref 30.0–100.0)

## 2017-05-14 LAB — LIPID PANEL
Chol/HDL Ratio: 4.6 ratio — ABNORMAL HIGH (ref 0.0–4.4)
Cholesterol, Total: 241 mg/dL — ABNORMAL HIGH (ref 100–199)
HDL: 52 mg/dL (ref >39)
LDL CALC: 145 mg/dL — AB (ref 0–99)
Triglycerides: 220 mg/dL — ABNORMAL HIGH (ref 0–149)
VLDL Cholesterol Cal: 44 mg/dL — ABNORMAL HIGH (ref 5–40)

## 2017-05-16 ENCOUNTER — Encounter: Payer: Self-pay | Admitting: Nurse Practitioner

## 2017-05-20 ENCOUNTER — Other Ambulatory Visit: Payer: Self-pay | Admitting: Nurse Practitioner

## 2017-05-20 ENCOUNTER — Telehealth: Payer: Self-pay | Admitting: Nurse Practitioner

## 2017-05-20 DIAGNOSIS — D649 Anemia, unspecified: Secondary | ICD-10-CM

## 2017-05-20 NOTE — Telephone Encounter (Signed)
Please order a B12, ferritin and iron panel; diagnosis anemia. Patient is aware and will pick up lab papers Tuesday. Thanks.

## 2017-05-20 NOTE — Telephone Encounter (Signed)
bw orders done and pt notified.

## 2017-05-21 LAB — VITAMIN B12

## 2017-05-21 LAB — IRON: IRON: 60 ug/dL (ref 27–139)

## 2017-05-21 LAB — FERRITIN: FERRITIN: 16 ng/mL (ref 15–150)

## 2017-05-23 ENCOUNTER — Other Ambulatory Visit: Payer: Self-pay | Admitting: Nurse Practitioner

## 2017-05-26 ENCOUNTER — Other Ambulatory Visit: Payer: Self-pay | Admitting: Nurse Practitioner

## 2017-05-28 ENCOUNTER — Ambulatory Visit: Payer: BC Managed Care – PPO

## 2017-05-28 VITALS — BP 134/76 | Ht 63.0 in | Wt 179.0 lb

## 2017-05-28 DIAGNOSIS — I10 Essential (primary) hypertension: Secondary | ICD-10-CM

## 2017-05-28 NOTE — Progress Notes (Signed)
   Subjective:    Patient ID: Natalie Dodson, female    DOB: 07-21-1950, 67 y.o.   MRN: [id number]  HPI  Patient is here today to follow up for bp check.Patient is on Lisinopril 5 mg one daily. She states she was seen last week by [name] and bp was elevated at that time and she asked her to come back for a recheck and she would ok Phentermine if bp was ok.  Review of Systems     Objective:   Physical Exam        Assessment & Plan:

## 2017-05-29 ENCOUNTER — Telehealth: Payer: Self-pay | Admitting: Nurse Practitioner

## 2017-05-29 NOTE — Telephone Encounter (Signed)
Patient said that Natalie Dodson was supposed to call in Rx for phentermine yesterday, but nothing was called in.  Please advise.  [employer]

## 2017-05-30 ENCOUNTER — Other Ambulatory Visit: Payer: Self-pay | Admitting: Nurse Practitioner

## 2017-05-30 MED ORDER — PHENTERMINE HCL 37.5 MG PO TABS: 37.5000 mg | ORAL_TABLET | Freq: Every day | ORAL | 2 refills | Status: AC

## 2017-05-30 NOTE — Telephone Encounter (Signed)
Done. Follow up in 3 months before further refills.

## 2017-06-10 ENCOUNTER — Telehealth: Payer: Self-pay

## 2017-06-10 NOTE — Telephone Encounter (Signed)
Per Vaughan Basta at [facility] pt will have to pay out of pocket for Phentermine 37.5. Patient is aware of this and aware the price per months supply is $28.27.

## 2017-06-12 DIAGNOSIS — M47812 Spondylosis without myelopathy or radiculopathy, cervical region: Secondary | ICD-10-CM | POA: Diagnosis not present

## 2017-06-12 DIAGNOSIS — M47817 Spondylosis without myelopathy or radiculopathy, lumbosacral region: Secondary | ICD-10-CM | POA: Diagnosis not present

## 2017-06-12 DIAGNOSIS — M5134 Other intervertebral disc degeneration, thoracic region: Secondary | ICD-10-CM | POA: Diagnosis not present

## 2017-06-12 DIAGNOSIS — G894 Chronic pain syndrome: Secondary | ICD-10-CM | POA: Diagnosis not present

## 2017-06-13 ENCOUNTER — Encounter (HOSPITAL_COMMUNITY): Payer: Self-pay

## 2017-07-01 ENCOUNTER — Other Ambulatory Visit: Payer: Self-pay | Admitting: Nurse Practitioner

## 2017-07-01 NOTE — Telephone Encounter (Signed)
Ok plus 5 monthly ref 

## 2017-07-14 DIAGNOSIS — M5134 Other intervertebral disc degeneration, thoracic region: Secondary | ICD-10-CM | POA: Diagnosis not present

## 2017-07-14 DIAGNOSIS — G894 Chronic pain syndrome: Secondary | ICD-10-CM | POA: Diagnosis not present

## 2017-07-14 DIAGNOSIS — M47817 Spondylosis without myelopathy or radiculopathy, lumbosacral region: Secondary | ICD-10-CM | POA: Diagnosis not present

## 2017-07-14 DIAGNOSIS — M47812 Spondylosis without myelopathy or radiculopathy, cervical region: Secondary | ICD-10-CM | POA: Diagnosis not present

## 2017-07-18 ENCOUNTER — Observation Stay (HOSPITAL_COMMUNITY)
Admission: EM | Admit: 2017-07-18 | Discharge: 2017-08-02 | Disposition: E | Payer: Medicare HMO | Attending: Pulmonary Disease | Admitting: Pulmonary Disease

## 2017-07-18 ENCOUNTER — Emergency Department (HOSPITAL_COMMUNITY): Payer: Medicare HMO

## 2017-07-18 ENCOUNTER — Observation Stay (HOSPITAL_COMMUNITY): Payer: Medicare HMO

## 2017-07-18 ENCOUNTER — Encounter (HOSPITAL_COMMUNITY): Payer: Self-pay

## 2017-07-18 ENCOUNTER — Other Ambulatory Visit: Payer: Self-pay

## 2017-07-18 DIAGNOSIS — K6389 Other specified diseases of intestine: Secondary | ICD-10-CM | POA: Insufficient documentation

## 2017-07-18 DIAGNOSIS — Z9911 Dependence on respirator [ventilator] status: Secondary | ICD-10-CM | POA: Insufficient documentation

## 2017-07-18 DIAGNOSIS — Z992 Dependence on renal dialysis: Secondary | ICD-10-CM | POA: Diagnosis not present

## 2017-07-18 DIAGNOSIS — R4182 Altered mental status, unspecified: Secondary | ICD-10-CM | POA: Insufficient documentation

## 2017-07-18 DIAGNOSIS — I700 Atherosclerosis of aorta: Secondary | ICD-10-CM | POA: Diagnosis not present

## 2017-07-18 DIAGNOSIS — Z9889 Other specified postprocedural states: Secondary | ICD-10-CM | POA: Insufficient documentation

## 2017-07-18 DIAGNOSIS — M1610 Unilateral primary osteoarthritis, unspecified hip: Secondary | ICD-10-CM | POA: Diagnosis not present

## 2017-07-18 DIAGNOSIS — J181 Lobar pneumonia, unspecified organism: Secondary | ICD-10-CM | POA: Insufficient documentation

## 2017-07-18 DIAGNOSIS — Z4682 Encounter for fitting and adjustment of non-vascular catheter: Secondary | ICD-10-CM | POA: Diagnosis not present

## 2017-07-18 DIAGNOSIS — J189 Pneumonia, unspecified organism: Secondary | ICD-10-CM | POA: Insufficient documentation

## 2017-07-18 DIAGNOSIS — J9602 Acute respiratory failure with hypercapnia: Secondary | ICD-10-CM | POA: Diagnosis not present

## 2017-07-18 DIAGNOSIS — Z4659 Encounter for fitting and adjustment of other gastrointestinal appliance and device: Secondary | ICD-10-CM

## 2017-07-18 DIAGNOSIS — A419 Sepsis, unspecified organism: Secondary | ICD-10-CM | POA: Diagnosis not present

## 2017-07-18 DIAGNOSIS — R4020 Unspecified coma: Secondary | ICD-10-CM | POA: Diagnosis not present

## 2017-07-18 DIAGNOSIS — Z981 Arthrodesis status: Secondary | ICD-10-CM | POA: Insufficient documentation

## 2017-07-18 DIAGNOSIS — M797 Fibromyalgia: Secondary | ICD-10-CM | POA: Diagnosis not present

## 2017-07-18 DIAGNOSIS — R6521 Severe sepsis with septic shock: Secondary | ICD-10-CM | POA: Diagnosis not present

## 2017-07-18 DIAGNOSIS — Z881 Allergy status to other antibiotic agents status: Secondary | ICD-10-CM | POA: Insufficient documentation

## 2017-07-18 DIAGNOSIS — R140 Abdominal distension (gaseous): Secondary | ICD-10-CM | POA: Diagnosis not present

## 2017-07-18 DIAGNOSIS — N179 Acute kidney failure, unspecified: Secondary | ICD-10-CM | POA: Diagnosis not present

## 2017-07-18 DIAGNOSIS — R739 Hyperglycemia, unspecified: Secondary | ICD-10-CM | POA: Insufficient documentation

## 2017-07-18 DIAGNOSIS — Z8614 Personal history of Methicillin resistant Staphylococcus aureus infection: Secondary | ICD-10-CM | POA: Diagnosis not present

## 2017-07-18 DIAGNOSIS — K219 Gastro-esophageal reflux disease without esophagitis: Secondary | ICD-10-CM | POA: Diagnosis not present

## 2017-07-18 DIAGNOSIS — I959 Hypotension, unspecified: Secondary | ICD-10-CM | POA: Diagnosis not present

## 2017-07-18 DIAGNOSIS — Z515 Encounter for palliative care: Secondary | ICD-10-CM | POA: Insufficient documentation

## 2017-07-18 DIAGNOSIS — Z90722 Acquired absence of ovaries, bilateral: Secondary | ICD-10-CM | POA: Insufficient documentation

## 2017-07-18 DIAGNOSIS — Z9884 Bariatric surgery status: Secondary | ICD-10-CM | POA: Diagnosis not present

## 2017-07-18 DIAGNOSIS — E872 Acidosis, unspecified: Secondary | ICD-10-CM | POA: Insufficient documentation

## 2017-07-18 DIAGNOSIS — Z452 Encounter for adjustment and management of vascular access device: Secondary | ICD-10-CM | POA: Diagnosis not present

## 2017-07-18 DIAGNOSIS — E875 Hyperkalemia: Secondary | ICD-10-CM | POA: Insufficient documentation

## 2017-07-18 DIAGNOSIS — M1990 Unspecified osteoarthritis, unspecified site: Secondary | ICD-10-CM | POA: Diagnosis not present

## 2017-07-18 DIAGNOSIS — Z79899 Other long term (current) drug therapy: Secondary | ICD-10-CM | POA: Diagnosis not present

## 2017-07-18 DIAGNOSIS — J9601 Acute respiratory failure with hypoxia: Secondary | ICD-10-CM | POA: Diagnosis not present

## 2017-07-18 DIAGNOSIS — R579 Shock, unspecified: Secondary | ICD-10-CM | POA: Diagnosis present

## 2017-07-18 DIAGNOSIS — D649 Anemia, unspecified: Secondary | ICD-10-CM | POA: Diagnosis not present

## 2017-07-18 DIAGNOSIS — G9340 Encephalopathy, unspecified: Secondary | ICD-10-CM | POA: Diagnosis not present

## 2017-07-18 DIAGNOSIS — J9811 Atelectasis: Secondary | ICD-10-CM | POA: Diagnosis not present

## 2017-07-18 DIAGNOSIS — Z90710 Acquired absence of both cervix and uterus: Secondary | ICD-10-CM | POA: Insufficient documentation

## 2017-07-18 LAB — BLOOD GAS, ARTERIAL
ACID-BASE DEFICIT: 17.2 mmol/L — AB (ref 0.0–2.0)
Bicarbonate: 11.2 mmol/L — ABNORMAL LOW (ref 20.0–28.0)
Drawn by: 28459
O2 CONTENT: 100 L/min
O2 SAT: 91.4 %
PH ART: 7.133 — AB (ref 7.350–7.450)
Patient temperature: 37
pCO2 arterial: 31.4 mmHg — ABNORMAL LOW (ref 32.0–48.0)
pO2, Arterial: 82.2 mmHg — ABNORMAL LOW (ref 83.0–108.0)

## 2017-07-18 LAB — URINALYSIS, ROUTINE W REFLEX MICROSCOPIC
Glucose, UA: NEGATIVE mg/dL
HGB URINE DIPSTICK: NEGATIVE
KETONES UR: NEGATIVE mg/dL
Nitrite: NEGATIVE
PROTEIN: 100 mg/dL — AB
Specific Gravity, Urine: 1.028 (ref 1.005–1.030)
pH: 5 (ref 5.0–8.0)

## 2017-07-18 LAB — GLUCOSE, CAPILLARY
GLUCOSE-CAPILLARY: 194 mg/dL — AB (ref 65–99)
Glucose-Capillary: 162 mg/dL — ABNORMAL HIGH (ref 65–99)
Glucose-Capillary: 210 mg/dL — ABNORMAL HIGH (ref 65–99)
Glucose-Capillary: 198 mg/dL — ABNORMAL HIGH (ref 65–99)

## 2017-07-18 LAB — CBC WITH DIFFERENTIAL/PLATELET
BASOS ABS: 0 10*3/uL (ref 0.0–0.1)
BASOS ABS: 0 10*3/uL (ref 0.0–0.1)
BASOS PCT: 0 %
Basophils Relative: 0 %
EOS PCT: 2 %
EOS PCT: 0 %
Eosinophils Absolute: 0.2 10*3/uL (ref 0.0–0.7)
Eosinophils Absolute: 0 10*3/uL (ref 0.0–0.7)
HCT: 40.5 % (ref 36.0–46.0)
HCT: 41.6 % (ref 36.0–46.0)
HEMOGLOBIN: 12 g/dL (ref 12.0–15.0)
Hemoglobin: 12.8 g/dL (ref 12.0–15.0)
LYMPHS ABS: 5.2 10*3/uL — AB (ref 0.7–4.0)
Lymphocytes Relative: 51 %
Lymphocytes Relative: 19 %
Lymphs Abs: 1.8 10*3/uL (ref 0.7–4.0)
MCH: 29.1 pg (ref 26.0–34.0)
MCH: 29.6 pg (ref 26.0–34.0)
MCHC: 29.6 g/dL — ABNORMAL LOW (ref 30.0–36.0)
MCHC: 30.8 g/dL (ref 30.0–36.0)
MCV: 98.3 fL (ref 78.0–100.0)
MCV: 96.1 fL (ref 78.0–100.0)
MONO ABS: 0.1 10*3/uL (ref 0.1–1.0)
Monocytes Absolute: 0.2 10*3/uL (ref 0.1–1.0)
Monocytes Relative: 2 %
Monocytes Relative: 1 %
NEUTROS ABS: 4.4 10*3/uL (ref 1.7–7.7)
NEUTROS ABS: 7.7 10*3/uL (ref 1.7–7.7)
NEUTROS PCT: 45 %
Neutrophils Relative %: 80 %
PLATELETS: 255 10*3/uL (ref 150–400)
PLATELETS: 223 10*3/uL (ref 150–400)
RBC: 4.12 MIL/uL (ref 3.87–5.11)
RBC: 4.33 MIL/uL (ref 3.87–5.11)
RDW: 15.7 % — ABNORMAL HIGH (ref 11.5–15.5)
RDW: 15.4 % (ref 11.5–15.5)
WBC: 10 10*3/uL (ref 4.0–10.5)
WBC: 9.6 10*3/uL (ref 4.0–10.5)

## 2017-07-18 LAB — POCT I-STAT 3, ART BLOOD GAS (G3+)
Acid-base deficit: 16 mmol/L — ABNORMAL HIGH (ref 0.0–2.0)
Bicarbonate: 14.3 mmol/L — ABNORMAL LOW (ref 20.0–28.0)
O2 SAT: 99 %
PCO2 ART: 50.6 mmHg — AB (ref 32.0–48.0)
PH ART: 7.056 — AB (ref 7.350–7.450)
Patient temperature: 97.5
TCO2: 16 mmol/L — ABNORMAL LOW (ref 22–32)
pO2, Arterial: 173 mmHg — ABNORMAL HIGH (ref 83.0–108.0)

## 2017-07-18 LAB — COMPREHENSIVE METABOLIC PANEL
ALBUMIN: 2.9 g/dL — AB (ref 3.5–5.0)
ALK PHOS: 129 U/L — AB (ref 38–126)
ALT: 186 U/L — AB (ref 14–54)
ALT: 274 U/L — AB (ref 14–54)
ALT: 232 U/L — ABNORMAL HIGH (ref 14–54)
ANION GAP: 14 (ref 5–15)
AST: 373 U/L — AB (ref 15–41)
AST: 631 U/L — AB (ref 15–41)
AST: 702 U/L — ABNORMAL HIGH (ref 15–41)
Albumin: 3 g/dL — ABNORMAL LOW (ref 3.5–5.0)
Albumin: 2.1 g/dL — ABNORMAL LOW (ref 3.5–5.0)
Alkaline Phosphatase: 158 U/L — ABNORMAL HIGH (ref 38–126)
Alkaline Phosphatase: 140 U/L — ABNORMAL HIGH (ref 38–126)
Anion gap: 14 (ref 5–15)
Anion gap: 12 (ref 5–15)
BUN: 30 mg/dL — AB (ref 6–20)
BUN: 33 mg/dL — AB (ref 6–20)
BUN: 32 mg/dL — ABNORMAL HIGH (ref 6–20)
CALCIUM: 8.1 mg/dL — AB (ref 8.9–10.3)
CHLORIDE: 105 mmol/L (ref 101–111)
CHLORIDE: 107 mmol/L (ref 101–111)
CHLORIDE: 111 mmol/L (ref 101–111)
CO2: 15 mmol/L — AB (ref 22–32)
CO2: 18 mmol/L — AB (ref 22–32)
CO2: 16 mmol/L — AB (ref 22–32)
CREATININE: 2.16 mg/dL — AB (ref 0.44–1.00)
CREATININE: 2.04 mg/dL — AB (ref 0.44–1.00)
Calcium: 7.4 mg/dL — ABNORMAL LOW (ref 8.9–10.3)
Calcium: 5.8 mg/dL — CL (ref 8.9–10.3)
Creatinine, Ser: 2.47 mg/dL — ABNORMAL HIGH (ref 0.44–1.00)
GFR calc Af Amer: 26 mL/min — ABNORMAL LOW (ref >60)
GFR calc Af Amer: 28 mL/min — ABNORMAL LOW (ref >60)
GFR calc non Af Amer: 23 mL/min — ABNORMAL LOW (ref >60)
GFR calc non Af Amer: 24 mL/min — ABNORMAL LOW (ref >60)
GFR, EST AFRICAN AMERICAN: 22 mL/min — AB (ref >60)
GFR, EST NON AFRICAN AMERICAN: 19 mL/min — AB (ref >60)
GLUCOSE: 154 mg/dL — AB (ref 65–99)
Glucose, Bld: 337 mg/dL — ABNORMAL HIGH (ref 65–99)
Glucose, Bld: 206 mg/dL — ABNORMAL HIGH (ref 65–99)
POTASSIUM: 6 mmol/L — AB (ref 3.5–5.1)
Potassium: 6 mmol/L — ABNORMAL HIGH (ref 3.5–5.1)
Potassium: 4.8 mmol/L (ref 3.5–5.1)
SODIUM: 141 mmol/L (ref 135–145)
Sodium: 134 mmol/L — ABNORMAL LOW (ref 135–145)
Sodium: 137 mmol/L (ref 135–145)
Total Bilirubin: 0.7 mg/dL (ref 0.3–1.2)
Total Bilirubin: 1 mg/dL (ref 0.3–1.2)
Total Bilirubin: 0.8 mg/dL (ref 0.3–1.2)
Total Protein: 6.3 g/dL — ABNORMAL LOW (ref 6.5–8.1)
Total Protein: 5.8 g/dL — ABNORMAL LOW (ref 6.5–8.1)
Total Protein: 4.1 g/dL — ABNORMAL LOW (ref 6.5–8.1)

## 2017-07-18 LAB — I-STAT CHEM 8, ED
BUN: 35 mg/dL — AB (ref 6–20)
CALCIUM ION: 1.07 mmol/L — AB (ref 1.15–1.40)
CHLORIDE: 107 mmol/L (ref 101–111)
Creatinine, Ser: 2.1 mg/dL — ABNORMAL HIGH (ref 0.44–1.00)
GLUCOSE: 315 mg/dL — AB (ref 65–99)
HCT: 39 % (ref 36.0–46.0)
Hemoglobin: 13.3 g/dL (ref 12.0–15.0)
Potassium: 6.1 mmol/L — ABNORMAL HIGH (ref 3.5–5.1)
Sodium: 135 mmol/L (ref 135–145)
TCO2: 17 mmol/L — ABNORMAL LOW (ref 22–32)

## 2017-07-18 LAB — PROTIME-INR
INR: 1.18
Prothrombin Time: 14.9 seconds (ref 11.4–15.2)

## 2017-07-18 LAB — I-STAT CG4 LACTIC ACID, ED
LACTIC ACID, VENOUS: 3.83 mmol/L — AB (ref 0.5–1.9)
Lactic Acid, Venous: 7.24 mmol/L (ref 0.5–1.9)

## 2017-07-18 LAB — APTT: APTT: 37 s — AB (ref 24–36)

## 2017-07-18 LAB — CK: Total CK: 163 U/L (ref 38–234)

## 2017-07-18 LAB — TYPE AND SCREEN
ABO/RH(D): A POS
ANTIBODY SCREEN: NEGATIVE

## 2017-07-18 LAB — I-STAT TROPONIN, ED: Troponin i, poc: 0 ng/mL (ref 0.00–0.08)

## 2017-07-18 LAB — MAGNESIUM: MAGNESIUM: 2.1 mg/dL (ref 1.7–2.4)

## 2017-07-18 LAB — MRSA PCR SCREENING: MRSA BY PCR: NEGATIVE

## 2017-07-18 LAB — LIPASE, BLOOD: LIPASE: 122 U/L — AB (ref 11–51)

## 2017-07-18 LAB — ETHANOL

## 2017-07-18 LAB — CBG MONITORING, ED: Glucose-Capillary: 320 mg/dL — ABNORMAL HIGH (ref 65–99)

## 2017-07-18 LAB — TROPONIN I
TROPONIN I: 0.05 ng/mL — AB (ref <0.03)
Troponin I: <0.03 ng/mL (ref <0.03)

## 2017-07-18 LAB — SALICYLATE LEVEL: Salicylate Lvl: <7 mg/dL (ref 2.8–30.0)

## 2017-07-18 LAB — HEPARIN LEVEL (UNFRACTIONATED): Heparin Unfractionated: 0.88 IU/mL — ABNORMAL HIGH (ref 0.30–0.70)

## 2017-07-18 LAB — LACTIC ACID, PLASMA: Lactic Acid, Venous: 5 mmol/L (ref 0.5–1.9)

## 2017-07-18 LAB — ACETAMINOPHEN LEVEL: Acetaminophen (Tylenol), Serum: <10 ug/mL — ABNORMAL LOW (ref 10–30)

## 2017-07-18 LAB — PHOSPHORUS: PHOSPHORUS: 6.2 mg/dL — AB (ref 2.5–4.6)

## 2017-07-18 LAB — D-DIMER, QUANTITATIVE: D-Dimer, Quant: >20 ug/mL-FEU — ABNORMAL HIGH (ref 0.00–0.50)

## 2017-07-18 LAB — AMMONIA: AMMONIA: 97 umol/L — AB (ref 9–35)

## 2017-07-18 MED ORDER — LINEZOLID 600 MG/300ML IV SOLN
600.0000 mg | Freq: Two times a day (BID) | INTRAVENOUS | Status: DC
  Administered 2017-07-18 (×2): 600 mg via INTRAVENOUS
  Filled 2017-07-18 (×8): qty 300

## 2017-07-18 MED ORDER — SODIUM CHLORIDE 0.9 % IV BOLUS
500.0000 mL | Freq: Once | INTRAVENOUS | Status: AC
  Administered 2017-07-18: 500 mL via INTRAVENOUS

## 2017-07-18 MED ORDER — INSULIN ASPART 100 UNIT/ML ~~LOC~~ SOLN
2.0000 [IU] | SUBCUTANEOUS | Status: DC
  Administered 2017-07-18 – 2017-07-19 (×2): 4 [IU] via SUBCUTANEOUS

## 2017-07-18 MED ORDER — PIPERACILLIN-TAZOBACTAM 3.375 G IVPB
3.3750 g | Freq: Three times a day (TID) | INTRAVENOUS | Status: DC
  Administered 2017-07-18: 3.375 g via INTRAVENOUS
  Filled 2017-07-18 (×3): qty 50

## 2017-07-18 MED ORDER — SODIUM CHLORIDE 0.9 % IV BOLUS
1000.0000 mL | Freq: Once | INTRAVENOUS | Status: AC
  Administered 2017-07-18: 1000 mL via INTRAVENOUS

## 2017-07-18 MED ORDER — EPINEPHRINE PF 1 MG/10ML IJ SOSY
PREFILLED_SYRINGE | INTRAMUSCULAR | Status: AC
  Filled 2017-07-18: qty 10

## 2017-07-18 MED ORDER — MIDAZOLAM HCL 2 MG/2ML IJ SOLN
2.0000 mg | Freq: Once | INTRAMUSCULAR | Status: AC
  Administered 2017-07-18: 2 mg via INTRAVENOUS

## 2017-07-18 MED ORDER — FENTANYL CITRATE (PF) 100 MCG/2ML IJ SOLN
50.0000 ug | INTRAMUSCULAR | Status: DC | PRN
  Filled 2017-07-18: qty 2

## 2017-07-18 MED ORDER — SODIUM BICARBONATE 8.4 % IV SOLN
150.0000 meq | Freq: Once | INTRAVENOUS | Status: AC
  Administered 2017-07-18: 150 meq via INTRAVENOUS

## 2017-07-18 MED ORDER — DEXMEDETOMIDINE HCL IN NACL 400 MCG/100ML IV SOLN
0.0000 ug/kg/h | INTRAVENOUS | Status: DC
Start: 2017-07-18 — End: 2017-07-19
  Administered 2017-07-18: 0.8 ug/kg/h via INTRAVENOUS
  Administered 2017-07-18: 0.6 ug/kg/h via INTRAVENOUS
  Filled 2017-07-18: qty 100

## 2017-07-18 MED ORDER — SODIUM CHLORIDE 0.9 % IV SOLN
1.0000 g | Freq: Once | INTRAVENOUS | Status: AC
  Administered 2017-07-18: 1 g via INTRAVENOUS
  Filled 2017-07-18: qty 10

## 2017-07-18 MED ORDER — FENTANYL CITRATE (PF) 100 MCG/2ML IJ SOLN
INTRAMUSCULAR | Status: AC
  Filled 2017-07-18: qty 2

## 2017-07-18 MED ORDER — VITAL AF 1.2 CAL PO LIQD: 1000.0000 mL | ORAL | Status: DC

## 2017-07-18 MED ORDER — DEXTROSE 50 % IV SOLN: 1.0000 | Freq: Once | INTRAVENOUS | Status: DC

## 2017-07-18 MED ORDER — SODIUM CHLORIDE 0.9 % FOR CRRT
INTRAVENOUS_CENTRAL | Status: DC | PRN
  Filled 2017-07-18: qty 1000

## 2017-07-18 MED ORDER — DEXTROSE 50 % IV SOLN
INTRAVENOUS | Status: AC
  Filled 2017-07-18: qty 50

## 2017-07-18 MED ORDER — FENTANYL CITRATE (PF) 100 MCG/2ML IJ SOLN
100.0000 ug | Freq: Once | INTRAMUSCULAR | Status: AC
  Administered 2017-07-18: 100 ug via INTRAVENOUS

## 2017-07-18 MED ORDER — NOREPINEPHRINE 4 MG/250ML-% IV SOLN
INTRAVENOUS | Status: AC
  Filled 2017-07-18: qty 250

## 2017-07-18 MED ORDER — EPINEPHRINE PF 1 MG/10ML IJ SOSY: 1.0000 | PREFILLED_SYRINGE | Freq: Once | INTRAMUSCULAR | Status: DC

## 2017-07-18 MED ORDER — HYDROCORTISONE NA SUCCINATE PF 100 MG IJ SOLR
50.0000 mg | Freq: Four times a day (QID) | INTRAMUSCULAR | Status: DC
  Administered 2017-07-18: 50 mg via INTRAVENOUS
  Filled 2017-07-18: qty 2

## 2017-07-18 MED ORDER — SODIUM BICARBONATE 4.2 % IV SOLN: 50.0000 meq | Freq: Once | INTRAVENOUS | Status: DC

## 2017-07-18 MED ORDER — ROCURONIUM BROMIDE 50 MG/5ML IV SOLN
50.0000 mg | Freq: Once | INTRAVENOUS | Status: AC
  Administered 2017-07-18: 50 mg via INTRAVENOUS

## 2017-07-18 MED ORDER — VASOPRESSIN 20 UNIT/ML IV SOLN: 0.0300 [IU]/min | INTRAVENOUS | Status: DC

## 2017-07-18 MED ORDER — SODIUM BICARBONATE 8.4 % IV SOLN
INTRAVENOUS | Status: AC
  Administered 2017-07-18: 50 meq
  Filled 2017-07-18: qty 50

## 2017-07-18 MED ORDER — LORAZEPAM 2 MG/ML IJ SOLN
1.0000 mg | Freq: Once | INTRAMUSCULAR | Status: AC
Start: 2017-07-18 — End: 2017-07-18
  Administered 2017-07-18: 1 mg via INTRAVENOUS

## 2017-07-18 MED ORDER — LORAZEPAM 2 MG/ML IJ SOLN
INTRAMUSCULAR | Status: AC
  Administered 2017-07-18: 1 mg via INTRAVENOUS
  Filled 2017-07-18: qty 1

## 2017-07-18 MED ORDER — SODIUM BICARBONATE 8.4 % IV SOLN
50.0000 meq | Freq: Once | INTRAVENOUS | Status: DC
  Filled 2017-07-18: qty 50

## 2017-07-18 MED ORDER — FENTANYL CITRATE (PF) 100 MCG/2ML IJ SOLN: 50.0000 ug | INTRAMUSCULAR | Status: DC | PRN

## 2017-07-18 MED ORDER — CALCIUM GLUCONATE 10 % IV SOLN
1.0000 g | Freq: Once | INTRAVENOUS | Status: AC
  Administered 2017-07-18: 1 g via INTRAVENOUS
  Filled 2017-07-18: qty 10

## 2017-07-18 MED ORDER — SODIUM CHLORIDE 0.9 % IV SOLN
Freq: Once | INTRAVENOUS | Status: AC
  Administered 2017-07-18: 1 mL via INTRAVENOUS

## 2017-07-18 MED ORDER — SODIUM CHLORIDE 0.9 % IV SOLN
INTRAVENOUS | Status: AC
  Administered 2017-07-18: 12:00:00 via INTRAVENOUS

## 2017-07-18 MED ORDER — STERILE WATER FOR INJECTION IV SOLN
INTRAVENOUS | Status: DC
  Administered 2017-07-18: 21:00:00 via INTRAVENOUS_CENTRAL
  Filled 2017-07-18 (×8): qty 150

## 2017-07-18 MED ORDER — SODIUM BICARBONATE 8.4 % IV SOLN
INTRAVENOUS | Status: DC
  Administered 2017-07-18: 06:00:00 via INTRAVENOUS
  Filled 2017-07-18 (×12): qty 150

## 2017-07-18 MED ORDER — ETOMIDATE 2 MG/ML IV SOLN
20.0000 mg | Freq: Once | INTRAVENOUS | Status: AC
  Administered 2017-07-18: 20 mg via INTRAVENOUS

## 2017-07-18 MED ORDER — PIPERACILLIN-TAZOBACTAM 3.375 G IVPB
3.3750 g | Freq: Four times a day (QID) | INTRAVENOUS | Status: DC
  Filled 2017-07-18 (×3): qty 50

## 2017-07-18 MED ORDER — DEXMEDETOMIDINE HCL IN NACL 200 MCG/50ML IV SOLN
0.0000 ug/kg/h | INTRAVENOUS | Status: DC
  Administered 2017-07-18: 0.6 ug/kg/h via INTRAVENOUS
  Filled 2017-07-18: qty 50

## 2017-07-18 MED ORDER — PRISMASOL BGK 4/2.5 32-4-2.5 MEQ/L IV SOLN
INTRAVENOUS | Status: DC
  Administered 2017-07-18: 21:00:00 via INTRAVENOUS_CENTRAL
  Filled 2017-07-18 (×7): qty 5000

## 2017-07-18 MED ORDER — SODIUM BICARBONATE 8.4 % IV SOLN
INTRAVENOUS | Status: AC
  Filled 2017-07-18: qty 50

## 2017-07-18 MED ORDER — DEXTROSE 5 % IV SOLN
0.0000 ug/min | INTRAVENOUS | Status: DC
  Filled 2017-07-18 (×2): qty 4

## 2017-07-18 MED ORDER — VASOPRESSIN 20 UNIT/ML IV SOLN
INTRAVENOUS | Status: AC
  Filled 2017-07-18: qty 2

## 2017-07-18 MED ORDER — HEPARIN (PORCINE) IN NACL 100-0.45 UNIT/ML-% IJ SOLN
1150.0000 [IU]/h | INTRAMUSCULAR | Status: DC
  Administered 2017-07-18: 1250 [IU]/h via INTRAVENOUS
  Filled 2017-07-18 (×2): qty 250

## 2017-07-18 MED ORDER — SODIUM CHLORIDE 0.9 % IV BOLUS: 1000.0000 mL | Freq: Once | INTRAVENOUS | Status: DC

## 2017-07-18 MED ORDER — NOREPINEPHRINE BITARTRATE 1 MG/ML IV SOLN
0.0000 ug/min | INTRAVENOUS | Status: DC
  Administered 2017-07-18: 40 ug/min via INTRAVENOUS
  Filled 2017-07-18 (×3): qty 16

## 2017-07-18 MED ORDER — PIPERACILLIN-TAZOBACTAM 3.375 G IVPB 30 MIN
3.3750 g | Freq: Once | INTRAVENOUS | Status: AC
  Administered 2017-07-18: 3.375 g via INTRAVENOUS
  Filled 2017-07-18: qty 50

## 2017-07-18 MED ORDER — HEPARIN SODIUM (PORCINE) 1000 UNIT/ML DIALYSIS
1000.0000 [IU] | INTRAMUSCULAR | Status: DC | PRN
  Administered 2017-07-18: 3200 [IU] via INTRAVENOUS_CENTRAL
  Filled 2017-07-18 (×2): qty 6

## 2017-07-18 MED ORDER — CALCIUM GLUCONATE 10 % IV SOLN: 1.0000 g | Freq: Once | INTRAVENOUS | Status: DC

## 2017-07-18 MED ORDER — PRO-STAT SUGAR FREE PO LIQD: 30.0000 mL | Freq: Two times a day (BID) | ORAL | Status: DC

## 2017-07-18 MED ORDER — INSULIN ASPART 100 UNIT/ML IV SOLN
10.0000 [IU] | Freq: Once | INTRAVENOUS | Status: AC
  Administered 2017-07-18: 10 [IU] via INTRAVENOUS

## 2017-07-18 MED ORDER — VASOPRESSIN 20 UNIT/ML IV SOLN
0.0300 [IU]/min | INTRAVENOUS | Status: DC
  Administered 2017-07-18: 0.03 [IU]/min via INTRAVENOUS
  Filled 2017-07-18 (×2): qty 2

## 2017-07-18 MED ORDER — HEPARIN BOLUS VIA INFUSION
3000.0000 [IU] | Freq: Once | INTRAVENOUS | Status: AC
  Administered 2017-07-18: 3000 [IU] via INTRAVENOUS

## 2017-07-18 MED ORDER — LINEZOLID 600 MG/300ML IV SOLN
600.0000 mg | Freq: Two times a day (BID) | INTRAVENOUS | Status: DC
  Filled 2017-07-18: qty 300

## 2017-07-18 MED ORDER — SODIUM BICARBONATE 8.4 % IV SOLN
INTRAVENOUS | Status: AC
  Filled 2017-07-18: qty 150

## 2017-07-18 MED ORDER — SODIUM BICARBONATE 8.4 % IV SOLN
100.0000 meq | Freq: Once | INTRAVENOUS | Status: AC
  Administered 2017-07-18: 100 meq via INTRAVENOUS
  Filled 2017-07-18: qty 50

## 2017-07-18 MED ORDER — DEXTROSE 50 % IV SOLN
25.0000 mL | Freq: Once | INTRAVENOUS | Status: DC
  Administered 2017-07-18: 25 mL via INTRAVENOUS

## 2017-07-18 MED ORDER — FENTANYL CITRATE (PF) 100 MCG/2ML IJ SOLN
50.0000 ug | Freq: Once | INTRAMUSCULAR | Status: AC
  Administered 2017-07-18: 50 ug via INTRAVENOUS
  Filled 2017-07-18: qty 2

## 2017-07-18 MED ORDER — STERILE DILUENT FOR HUMULIN INSULINS: 10.0000 [IU] | Freq: Once | SUBCUTANEOUS | Status: DC

## 2017-07-18 MED ORDER — DEXTROSE 5 % IV SOLN
0.0000 ug/min | INTRAVENOUS | Status: DC
  Administered 2017-07-18: 5 ug/min via INTRAVENOUS
  Filled 2017-07-18: qty 4

## 2017-07-18 MED ORDER — FENTANYL CITRATE (PF) 100 MCG/2ML IJ SOLN
50.0000 ug | Freq: Once | INTRAMUSCULAR | Status: AC
  Administered 2017-07-18: 50 ug via INTRAVENOUS

## 2017-07-18 MED ORDER — SODIUM CHLORIDE 0.9 % IV SOLN
1.2500 ng/kg/min | INTRAVENOUS | Status: DC
  Administered 2017-07-18: 5 ng/kg/min via INTRAVENOUS
  Filled 2017-07-18: qty 1

## 2017-07-18 MED ORDER — HEPARIN SODIUM (PORCINE) 1000 UNIT/ML DIALYSIS
1000.0000 [IU] | INTRAMUSCULAR | Status: DC | PRN
  Filled 2017-07-18 (×2): qty 6

## 2017-07-18 MED ORDER — MIDAZOLAM HCL 2 MG/2ML IJ SOLN
INTRAMUSCULAR | Status: AC
  Filled 2017-07-18: qty 2

## 2017-07-18 NOTE — Procedures (Signed)
Central Venous Dialysis Catheter Insertion Procedure Note Jamacia Jester Stober [id number] 1950/03/19  Procedure: Insertion of Central Venous Catheter Indications: HD Access  Procedure Details Consent: Risks of procedure as well as the alternatives and risks of each were explained to the (patient/caregiver).  Consent for procedure obtained. Time Out: Verified patient identification, verified procedure, site/side was marked, verified correct patient position, special equipment/implants available, medications/allergies/relevent history reviewed, required imaging and test results available.  Performed  Maximum sterile technique was used including antiseptics, cap, gloves, gown, Knippel hygiene, mask and sheet. Skin prep: Chlorhexidine; local anesthetic administered A antimicrobial bonded/coated triple lumen catheter was placed in the left femoral vein due to patient being a dialysis patient using the Seldinger technique.  Evaluation Blood flow good Complications: No apparent complications Patient did tolerate procedure well. Chest X-ray ordered to verify placement.  CXR: pending.  Wilberta Dorvil 07/04/2017, 5:05 PM

## 2017-07-18 NOTE — ED Notes (Signed)
CRITICAL VALUE ALERT  Critical Value:  Lactic Acid 3.83  Date & Time Notied:  07/20/2017 & 0605 hrs  Provider Notified: Dr. Wyvonnia Dusky  Orders Received/Actions taken: N/A

## 2017-07-18 NOTE — H&P (Signed)
PULMONARY / CRITICAL CARE MEDICINE   Name: Natalie Dodson MRN: [id number] DOB: 08/15/1950    ADMISSION DATE:  07/29/2017 CONSULTATION DATE:  07/02/2017  REFERRING MD:  [facility] ED - Thurnell Garbe  CHIEF COMPLAINT:  HCAP and VDRF  HISTORY OF PRESENT ILLNESS:   67 year old female with PMH below presenting with AMS and respiratory failure from HCAP.  The patient is obtunded upon arrival and I am unable to obtain history, no family bedside.  From EDP notes, patient was found slumped over on the toilet.  Patient was taken to the ER in [facility] where she was given narcan and woke up.  Evidently she has chronic pain and is on chronic pain medications.    PAST MEDICAL HISTORY :  She  has a past medical history of Arthritis, Chronic diarrhea, Fibromyalgia, GERD (gastroesophageal reflux disease), MRSA (methicillin resistant staph aureus) culture positive (11-06-11), and PONV (postoperative nausea and vomiting) (11-06-11).  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  Allergies  Allergen Reactions  . Vancomycin Hives and Other (See Comments)    Can spread all over the body. Can spread all over the body.    No current facility-administered medications on file prior to encounter.    Current Outpatient Medications on File Prior to Encounter  Medication Sig  . ALPRAZolam (XANAX) 0.5 MG tablet TAKE ONE TABLET BY MOUTH TWICE DAILY AS NEEDED. MUST LAST 30 DAYS.  . Calcium Carbonate (CALCIUM 600  PO) Take by mouth. 2 a day  . Cholecalciferol (VITAMIN D PO) Take by mouth. Vit d3 2,000 units daily  . esomeprazole (NEXIUM) 40 MG capsule Take 40 mg by mouth 2 (two) times daily as needed (for acid reflux).   . gabapentin (NEURONTIN) 300 MG capsule Take 1 capsule (300 mg total) by mouth 3 (three) times daily.  Marland Kitchen lisinopril (PRINIVIL,ZESTRIL) 5 MG tablet Take 1 tablet (5 mg total) by mouth daily.  . methocarbamol (ROBAXIN) 500 MG tablet   . Multiple Vitamin (MULTIVITAMIN) tablet Take 1 tablet by mouth daily. 2 a day  . nystatin (MYCOSTATIN) 100000 UNIT/ML suspension Take 5 mLs (500,000 Units total) by mouth 4 (four) times daily.  Marland Kitchen oxyCODONE-acetaminophen (PERCOCET) 10-325 MG tablet Take 1 tablet by mouth every 4 (four) hours as needed for pain.  . phentermine (ADIPEX-P) 37.5 MG tablet Take 1 tablet (37.5 mg total) by mouth daily before breakfast.    FAMILY HISTORY:  Her indicated that her mother is deceased. She indicated that her father is deceased. She indicated that the status of her neg hx is unknown.   SOCIAL HISTORY: She  reports that she has never smoked. She has never used smokeless tobacco. She reports that she does not drink alcohol or use drugs.  REVIEW OF SYSTEMS:   Unattainable  SUBJECTIVE:  AMS, unable to obtain  VITAL SIGNS: BP 108/62   Pulse (!) 108   Temp (!) 97.5 F (36.4 C) (Oral)   Resp (!) 23   Ht 5\' 5"  (1.651 m)   Wt 179 lb (81.2 kg)   SpO2 92%   BMI 29.79 kg/m   HEMODYNAMICS:    VENTILATOR SETTINGS:    INTAKE /  OUTPUT: I/O last 3 completed shifts: In: 2870 [I.V.:110; IV Piggyback:2760] Out: -   PHYSICAL EXAMINATION: General:  Acute on chronically ill appearing female Neuro:  Delirious, moving all ext spontaneously  HEENT:  [place]/AT, PERRL, EOM-I and MMM Cardiovascular:  RRR, Nl S1/S2 and -M/R/G Lungs:  Coarse BS diffusely, patient is not clearing her airway Abdomen:  Soft, NT, ND and +BS Musculoskeletal:  -edema and -tenderness Skin:   Intact  LABS:  BMET Recent Labs  Lab 07/02/2017 0342 07/08/2017 0400 07/31/2017 0828  NA 134* 135 137  K 6.0* 6.1* 4.8  CL 105 107 107  CO2 15*  --  18*  BUN 30* 35* 33*  CREATININE 2.16* 2.10* 2.04*  GLUCOSE 337* 315* 154*    Electrolytes Recent Labs  Lab 07/06/2017 0342 07/27/2017 0828  CALCIUM 8.1* 7.4*    CBC Recent Labs  Lab 07/29/2017 0342 07/28/2017 0400 07/07/2017 0828  WBC 10.0  --  9.6  HGB 12.0 13.3 12.8  HCT 40.5 39.0 41.6  PLT 255  --  223    Coag's Recent Labs  Lab 07/28/2017 0342  APTT 37*  INR 1.18    Sepsis Markers Recent Labs  Lab 07/06/2017 0356 07/22/2017 0552 07/11/2017 0828  LATICACIDVEN 7.24* 3.83* 5.0*    ABG Recent Labs  Lab 07/16/2017 0342  PHART 7.133*  PCO2ART 31.4*  PO2ART 82.2*    Liver Enzymes Recent Labs  Lab 07/22/2017 0342 07/12/2017 0828  AST 373* 631*  ALT 186* 274*  ALKPHOS 129* 158*  BILITOT 0.7 1.0  ALBUMIN 3.0* 2.9*    Cardiac Enzymes Recent Labs  Lab 07/08/2017 0342 07/24/2017 0828  TROPONINI <0.03 0.05*    Glucose Recent Labs  Lab 07/11/2017 0346 07/30/2017 1047  GLUCAP 320* 162*    Imaging Ct Abdomen Pelvis Wo Contrast  Result Date: 07/17/2017 CLINICAL DATA:  Acute onset of altered level of consciousness. Patient is semi-responsive. Sepsis. Abdominal distention. EXAM: CT ABDOMEN AND PELVIS WITHOUT CONTRAST TECHNIQUE: Multidetector CT imaging of the abdomen and pelvis was performed following the standard protocol without IV contrast. COMPARISON:  CT of the abdomen and pelvis performed 11/21/2009 FINDINGS: Lower chest: There is partial consolidation of the right lower lobe, compatible with pneumonia. The visualized portions of the mediastinum are unremarkable. Hepatobiliary: The liver is unremarkable in appearance. The gallbladder is unremarkable in appearance. The common bile duct remains normal in caliber. Pancreas: The pancreas is within normal limits. Spleen: The spleen is unremarkable in appearance.  Adrenals/Urinary Tract: The adrenal glands are unremarkable in appearance. The kidneys are within normal limits. There is no evidence of hydronephrosis. No renal or ureteral stones are identified. No perinephric stranding is seen. Stomach/Bowel: The patient is status post sleeve gastrectomy. The stomach is otherwise unremarkable. The small bowel is within normal limits. The patient is status post appendectomy. The colon is largely distended with fluid and air, and is grossly unremarkable in appearance. Vascular/Lymphatic: Scattered calcification is seen along the abdominal aorta and its branches. The abdominal aorta is otherwise grossly unremarkable. The inferior vena cava is grossly unremarkable. No retroperitoneal lymphadenopathy is seen. No pelvic sidewall lymphadenopathy is identified. Reproductive: The bladder is mildly distended and grossly unremarkable. The patient is status post hysterectomy. No suspicious adnexal masses are seen. Other: No additional soft tissue abnormalities are seen. Musculoskeletal: No acute osseous abnormalities are identified. Decompression is noted at the lower lumbar spine. There is chronic osseous fusion at L5-S1. The visualized musculature is unremarkable in appearance. IMPRESSION: 1. Colon largely distended with fluid and  air; this may correspond to the patient's abdominal distention. No evidence for obstruction. 2. Right lower lobe pneumonia. Aortic Atherosclerosis (ICD10-I70.0). Electronically Signed   By: Garald Balding M.D.   On: 07/27/2017 05:49   Ct Head Wo Contrast  Result Date: 07/27/2017 CLINICAL DATA:  67 y/o  F; encephalopathy. EXAM: CT HEAD WITHOUT CONTRAST TECHNIQUE: Contiguous axial images were obtained from the base of the skull through the vertex without intravenous contrast. COMPARISON:  None. FINDINGS: Brain: No evidence of acute infarction, hemorrhage, hydrocephalus, extra-axial collection or mass lesion/mass effect. Vascular: No hyperdense vessel or  unexpected calcification. Skull: Normal. Negative for fracture or focal lesion. Sinuses/Orbits: No acute finding. Other: None. IMPRESSION: No acute intracranial abnormality. Unremarkable CT of the head for age. Electronically Signed   By: Kristine Garbe M.D.   On: 07/05/2017 05:49   Dg Chest Portable 1 View  Result Date: 07/25/2017 CLINICAL DATA:  67 y/o F; unresponsive patient with low oxygen saturation and abdominal distention. EXAM: PORTABLE CHEST 1 VIEW COMPARISON:  10/21/2013 chest radiograph FINDINGS: Normal cardiac silhouette. Aortic atherosclerosis with calcification. Low lung volumes accentuate pulmonary markings. No focal consolidation. No pleural effusion or pneumothorax. Anterior cervical discectomy and fusion hardware. Post right shoulder rotator cuff repair. IMPRESSION: Low lung volumes.  No acute pulmonary process identified. Electronically Signed   By: Kristine Garbe M.D.   On: 07/14/2017 04:13   Dg Abd Portable 2 Views  Result Date: 07/23/2017 CLINICAL DATA:  67 y/o F; unresponsive patient with abdominal distention. EXAM: PORTABLE ABDOMEN - 2 VIEW COMPARISON:  None. FINDINGS: Mild diffuse air-filled dilatation of large bowel. Degenerative changes of the lumbar spine and mild levocurvature. Surgical sutures project over the right lower quadrant. Mild osteoarthrosis of the hip joints. IMPRESSION: Mild diffuse air-filled distention of large bowel may represent constipation, distal obstruction, or pseudo-obstruction. Electronically Signed   By: Kristine Garbe M.D.   On: 07/29/2017 04:16     STUDIES:  Head CT negative  CULTURES: Blood 5/17>>> Urine 5/17>>> Sputum 5/17>>>  ANTIBIOTICS: Linezolid 5/17>>> Zosyn 5/17>>>  SIGNIFICANT EVENTS:   LINES/TUBES: ETT 5/17>>> L [place] TLC 5/17>>> R radial a-line 5/17>>> OGT 5/17>>>  DISCUSSION: 67 year old female with HCAP vs aspiration, respiratory failure and ileus.  VDRF and septic shock  ASSESSMENT /  PLAN:  PULMONARY A: VDRF due to aspiration vs HCAP P:   - Intubate - Full vent support - ABG and adjust vent accordingly - Abx as above - Pan culture  CARDIOVASCULAR A:  Septic shock P:  - IVF resuscitation - CVP - Tele monitoring - Levophed/vasopressin  RENAL A:   Acute renal failure Metabolic acidosis P:   - IVF resuscitation - CVP - BMET in AM - Replace electrolytes as indicated  GASTROINTESTINAL A:   Ileus likely due to narcotics P:   - AXR - Reglan - TF per nutrition  HEMATOLOGIC A:   No active issues P:  - CBC in AM - Transfuse per ICU protocol  INFECTIOUS A:   HCAP vs Aspiration P:   - Linezolid/zosyn - Pan culture  ENDOCRINE A:   Hyperglycemia   P:   - ISS - CBGs  NEUROLOGIC A:   AMS due to sepsis, negative head CT P:   RASS goal: 0 - Precedex drip - Fentanyl pushes PRN  FAMILY  - Updates: No family bedside  - Inter-disciplinary family meet or Palliative Care meeting due by:  day 7  The patient is critically ill with multiple organ systems failure and requires high  complexity decision making for assessment and support, frequent evaluation and titration of therapies, application of advanced monitoring technologies and extensive interpretation of multiple databases.   Critical Care Time devoted to patient care services described in this note is  45  Minutes. This time reflects time of care of this signee Dr [name]. This critical care time does not reflect procedure time, or teaching time or supervisory time of PA/NP/Med student/Med Resident etc but could involve care discussion time.  Rush Farmer, M.D. [facility] Pulmonary/Critical Care Medicine. Pager: [phone]. After hours pager: [phone].  07/13/2017, 12:12 PM

## 2017-07-18 NOTE — Progress Notes (Signed)
ANTICOAGULATION CONSULT NOTE  Pharmacy Consult for Heparin Indication: R/o Pulmonary embolism  Allergies  Allergen Reactions  . Vancomycin Hives and Other (See Comments)    Can spread all over the body. Can spread all over the body.    Patient Measurements: Height: 5\' 5"  (165.1 cm) Weight: 179 lb (81.2 kg) IBW/kg (Calculated) : 57 HEPARIN DW (KG): 74.2   Vital Signs: Temp: 97.7 F (36.5 C) (05/17 1630) Temp Source: Oral (05/17 1142) BP: 84/66 (05/17 1615) Pulse Rate: 104 (05/17 1630)  Labs: Recent Labs    07/13/2017 0342 07/13/2017 0400 07/07/2017 0828 08/01/2017 1514  HGB 12.0 13.3 12.8  --   HCT 40.5 39.0 41.6  --   PLT 255  --  223  --   APTT 37*  --   --   --   LABPROT 14.9  --   --   --   INR 1.18  --   --   --   HEPARINUNFRC  --   --   --  0.88*  CREATININE 2.16* 2.10* 2.04*  --   CKTOTAL 163  --   --   --   TROPONINI <0.03  --  0.05*  --    Estimated Creatinine Clearance: 28.9 mL/min (A) (by C-G formula based on SCr of 2.04 mg/dL (H)).   Assessment: 67 yo female brought by EMS to the ED with altered mental status, hypotension, tachycardia, and abdominal pain. Chest CT shows pneumonia, but cannot r/o PE. D-dimer is >20. Pharmacy has been consulted for IV heparin dosing. CTA unable to be performed d/t AKI.  Patient transferred to [facility] ICU while on heparin. First level drawn here- elevated at 0.88 units/mL. No bleeding noted.  Goal of Therapy:  Heparin level goal: 0.3-0.7 units/ml Monitor platelets by anticoagulation protocol: Yes   Plan:  Decrease heparin to 1150 units/hr Recheck heparin level with AM labs Daily heparin level and CBC Follow for s/s bleeding and any imaging that can confirm/deny presence of VTE   Jazzmon Prindle D. Lilyth Lawyer, PharmD, BCPS Clinical Pharmacist [phone] 07/11/2017 4:44 PM

## 2017-07-18 NOTE — Progress Notes (Addendum)
Panic ABG results given to Darcella Gasman, RN

## 2017-07-18 NOTE — Progress Notes (Signed)
PCCM Interval Note  Called to patient bedside for hypotension. BP 40/20 after initiating CRRT. Currently on 200 ml/hr Bicarb gtt, 50 mcg Levophed, 0.03 Vasopressin.   CRRT paused. Given 250 ml bolus and 1 mg EPI with improvement of BP. CRRT re-started at half of ordered flow rate. Shortly after BP dropped to 60/30. CRRT stopped. Nephrology recommended to give 150 meq of Bicarb with 500 ml bolus and to attempt to restart once BP is stabilized. Giapreza added.   Hayden Pedro, AGACNP-BC [place] [facility]  Pgr: [phone]  PCCM Pgr: [phone]

## 2017-07-18 NOTE — Procedures (Signed)
Central Venous Catheter Insertion Procedure Note Pauline Pegues Pfister [id number] 04/02/1950  Procedure: Insertion of Central Venous Catheter Indications: Assessment of intravascular volume, Drug and/or fluid administration and Frequent blood sampling  Procedure Details Consent: Unable to obtain consent because of emergent medical necessity. Time Out: Verified patient identification, verified procedure, site/side was marked, verified correct patient position, special equipment/implants available, medications/allergies/relevent history reviewed, required imaging and test results available.  Performed  Maximum sterile technique was used including antiseptics, cap, gloves, gown, Brzozowski hygiene, mask and sheet. Skin prep: Chlorhexidine; local anesthetic administered A antimicrobial bonded/coated triple lumen catheter was placed in the left subclavian vein using the Seldinger technique.  Evaluation Blood flow good Complications: No apparent complications Patient did tolerate procedure well. Chest X-ray ordered to verify placement.  CXR: pending.  YACOUB,WESAM 07/11/2017, 12:27 PM

## 2017-07-18 NOTE — Consult Note (Addendum)
 [facility] Consult  Reason for Consult: Hypotension, ? Septic Shock  Referring Physician:  Dr. Wyvonnia Dusky   Chief Complaint    Altered Mental Status      Natalie Dodson is a 67 y.o. female.  HPI: Called by Dr. Wyvonnia Dusky prior to CT being done due to hypotension, metabolic acidosis with lactic to 7+.  Patient with prior Lap band reversion to Lap sleeve, history of diarrhea and imodium use and dilated colon on Xray.  Came in immediately. He had already placed a CVL, and patient to CT. On my read of the CT dilated colon with some in sigmoid and cecum, fluid in the transverse, cecum only measuring 10 max.  No free fluid, no free air, no internal hernia, sleeve normal appearance, RLL infiltrate.    Patient's husband reported he found her on the toilet, slumped over and unresponsive after going to be at about 1am.  He arrive to ED at about 3am.  She has had no recent complaints but is on chronic opioids for back pain and chronic diarrhea on imodium.  She has had a cough per her report. When I talked to her she had some LLQ pain.  She was hypothermic, hypotensive, and oriented to self and partial oriented to date and location.  Past Medical History:  Diagnosis Date  . Arthritis   . Chronic diarrhea    d/t lap band  . Fibromyalgia   . GERD (gastroesophageal reflux disease)   . MRSA (methicillin resistant staph aureus) culture positive 11-06-11   cultured from back surgery hardware-hardware removed  . PONV (postoperative nausea and vomiting) 11-06-11   nausea with anesthesia    Past Surgical History:  Procedure Laterality Date  . ABDOMINAL HYSTERECTOMY     then ovarian removal  . ANTERIOR CERVICAL DECOMP/DISCECTOMY FUSION N/A 12/05/2014   Procedure: C4 Corpectomy with decompression C3-4 and C4-5 neuroforamen, allograft, ilac crest bone graft, cervical plate and screws;  Surgeon: Jessy [place], MD;  Location: [place];  Service: Orthopedics;  Laterality: N/A;  . APPENDECTOMY    . BACK  SURGERY     X6  . BILATERAL OOPHORECTOMY    . CARPAL TUNNEL RELEASE     right [place]  . CERVICAL FUSION    . COLONOSCOPY N/A 08/18/2012   RMR: Rectal polyp-removed. Colonic diverticulosis s/p segmental biopsies and stool sampling  . ELBOW SURGERY  11-06-11   bilateral "tendon release"  . HERNIA REPAIR     umbilical  . LAPAROSCOPIC GASTRIC BANDING  12/12/08  . LUMBAR LAMINECTOMY/DECOMPRESSION MICRODISCECTOMY N/A 08/26/2014   Procedure: RIGHT L2-3 LATERAL RECESS DECOMPRESSION, BILATERAL L3-4 LATERAL RECESS DECOMPRESSION, BILATERAL L4 FORAMINOTOMY;  Surgeon: Jessy [place], MD;  Location: [place];  Service: Orthopedics;  Laterality: N/A;  . removal of laparoscopic gastric band  01/02/10   port removed prior to this surgery  . ROTATOR CUFF REPAIR     bilateral  . TONSILLECTOMY    . 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 vertical sleeve gastrectomy N/A     Family History  Problem Relation Age of Onset  . Colon cancer Neg Hx     Social History   Tobacco Use  . Smoking status: Never Smoker  . Smokeless tobacco: Never Used  Substance Use Topics  . Alcohol use: No  . Drug use: No    Medications: I have reviewed the patient's current medications. Current Facility-Administered Medications  Medication Dose Route Frequency Provider Last Rate Last Dose  . heparin ADULT infusion  100 units/mL (25000 units/238m sodium chloride 0.45%)  1,250 Units/hr Intravenous Continuous Rancour, Stephen, MD 12.5 mL/hr at 07/08/2017 0645 1,250 Units/hr at 07/25/2017 0645  . linezolid (ZYVOX) IVPB 600 mg  600 mg Intravenous Q12H Rancour, Stephen, MD      . norepinephrine (LEVOPHED) 4 mg in dextrose 5 % 250 mL (0.016 mg/mL) infusion  0-40 mcg/min Intravenous Titrated Rancour, SAnnie Main MD 56.3 mL/hr at 07/12/2017 0626 15 mcg/min at 07/24/2017 0626  . sodium bicarbonate 150 mEq in dextrose 5 % 1,000 mL infusion   Intravenous Continuous Rancour, SAnnie Main MD 100 mL/hr at 07/03/2017 02778    Current Outpatient Medications   Medication Sig Dispense Refill Last Dose  . ALPRAZolam (XANAX) 0.5 MG tablet TAKE ONE TABLET BY MOUTH TWICE DAILY AS NEEDED. MUST LAST 30 DAYS. 30 tablet 5   . Calcium Carbonate (CALCIUM 600 PO) Take by mouth. 2 a day   Taking  . Cholecalciferol (VITAMIN D PO) Take by mouth. Vit d3 2,000 units daily   Taking  . esomeprazole (NEXIUM) 40 MG capsule Take 40 mg by mouth 2 (two) times daily as needed (for acid reflux).    Taking  . gabapentin (NEURONTIN) 300 MG capsule Take 1 capsule (300 mg total) by mouth 3 (three) times daily. 270 capsule 1 Taking  . lisinopril (PRINIVIL,ZESTRIL) 5 MG tablet Take 1 tablet (5 mg total) by mouth daily. 90 tablet 1 Taking  . methocarbamol (ROBAXIN) 500 MG tablet    Taking  . Multiple Vitamin (MULTIVITAMIN) tablet Take 1 tablet by mouth daily. 2 a day   Taking  . nystatin (MYCOSTATIN) 100000 UNIT/ML suspension Take 5 mLs (500,000 Units total) by mouth 4 (four) times daily. 120 mL 0 Taking  . oxyCODONE-acetaminophen (PERCOCET) 10-325 MG tablet Take 1 tablet by mouth every 4 (four) hours as needed for pain.   Taking  . phentermine (ADIPEX-P) 37.5 MG tablet Take 1 tablet (37.5 mg total) by mouth daily before breakfast. 30 tablet 2    Allergies  Allergen Reactions  . Vancomycin Hives and Other (See Comments)    Can spread all over the body. Can spread all over the body.    ROS:  A comprehensive review of systems was negative except for: Respiratory: positive for cough Gastrointestinal: positive for abdominal pain and improving history of chronic diarrhea and chronic pain  Blood pressure (!) 80/48, pulse (!) 102, temperature (!) 93 F (33.9 C), resp. rate 20, height '5\' 5"'$  (1.651 m), weight 179 lb (81.2 kg), SpO2 100 %. Physical Exam  Constitutional: She appears well-developed and well-nourished. No distress.  HENT:  Head: Normocephalic and atraumatic.  Eyes: Pupils are equal, round, and reactive to light. EOM are normal.  Neck: Normal range of motion.   Cardiovascular: Normal rate and regular rhythm.  Pulmonary/Chest: Effort normal. She has wheezes.  Right upper with wheezing on expiration  Abdominal: Soft. She exhibits no distension. There is tenderness. There is no rebound and no guarding.  LLQ tenderness and some fullness, non tender in the RUQ, RLQ, epigastric region  Genitourinary: Rectal exam shows external hemorrhoid and anal tone abnormal. Rectal exam shows no mass and guaiac negative stool.  Genitourinary Comments: Stool in vault  Musculoskeletal: Normal range of motion. She exhibits no edema.  Neurological: She is alert.  Oriented to self and partial oriented to [facility]  Skin: Skin is dry.  Mottled and cool  Vitals reviewed.   Results: Results for orders placed or performed during the hospital encounter of 07/30/2017 (from  the past 48 hour(s))  CBC with Differential/Platelet     Status: Abnormal   Collection Time: 07/25/2017  3:42 AM  Result Value Ref Range   WBC 10.0 4.0 - 10.5 K/uL   RBC 4.12 3.87 - 5.11 MIL/uL   Hemoglobin 12.0 12.0 - 15.0 g/dL   HCT 40.5 36.0 - 46.0 %   MCV 98.3 78.0 - 100.0 fL   MCH 29.1 26.0 - 34.0 pg   MCHC 29.6 (L) 30.0 - 36.0 g/dL   RDW 15.7 (H) 11.5 - 15.5 %   Platelets 255 150 - 400 K/uL   Neutrophils Relative % 45 %   Neutro Abs 4.4 1.7 - 7.7 K/uL   Lymphocytes Relative 51 %   Lymphs Abs 5.2 (H) 0.7 - 4.0 K/uL   Monocytes Relative 2 %   Monocytes Absolute 0.2 0.1 - 1.0 K/uL   Eosinophils Relative 2 %   Eosinophils Absolute 0.2 0.0 - 0.7 K/uL   Basophils Relative 0 %   Basophils Absolute 0.0 0.0 - 0.1 K/uL    Comment: Performed at [facility], [address], [place], [place] [address]  Comprehensive metabolic panel     Status: Abnormal   Collection Time: 07/25/2017  3:42 AM  Result Value Ref Range   Sodium 134 (L) 135 - 145 mmol/L   Potassium 6.0 (H) 3.5 - 5.1 mmol/L   Chloride 105 101 - 111 mmol/L   CO2 15 (L) 22 - 32 mmol/L   Glucose, Bld 337 (H) 65 - 99 mg/dL   BUN 30 (H)  6 - 20 mg/dL   Creatinine, Ser 2.16 (H) 0.44 - 1.00 mg/dL   Calcium 8.1 (L) 8.9 - 10.3 mg/dL   Total Protein 6.3 (L) 6.5 - 8.1 g/dL   Albumin 3.0 (L) 3.5 - 5.0 g/dL   AST 373 (H) 15 - 41 U/L   ALT 186 (H) 14 - 54 U/L   Alkaline Phosphatase 129 (H) 38 - 126 U/L   Total Bilirubin 0.7 0.3 - 1.2 mg/dL   GFR calc non Af Amer 23 (L) >60 mL/min   GFR calc Af Amer 26 (L) >60 mL/min    Comment: (NOTE) The eGFR has been calculated using the CKD EPI equation. This calculation has not been validated in all clinical situations. eGFR's persistently <60 mL/min signify possible Chronic Kidney Disease.    Anion gap 14 5 - 15    Comment: Performed at [facility], [address], [place], [place] [address]  Troponin I     Status: None   Collection Time: 08/01/2017  3:42 AM  Result Value Ref Range   Troponin I <0.03 <0.03 ng/mL    Comment: Performed at [facility], [address] [place], [place] [address]  Urinalysis, Routine w reflex microscopic     Status: Abnormal   Collection Time: 07/15/2017  3:42 AM  Result Value Ref Range   Color, Urine YELLOW YELLOW   APPearance HAZY (A) CLEAR   Specific Gravity, Urine 1.028 1.005 - 1.030   pH 5.0 5.0 - 8.0   Glucose, UA NEGATIVE NEGATIVE mg/dL   Hgb urine dipstick NEGATIVE NEGATIVE   Bilirubin Urine SMALL (A) NEGATIVE   Ketones, ur NEGATIVE NEGATIVE mg/dL   Protein, ur 100 (A) NEGATIVE mg/dL   Nitrite NEGATIVE NEGATIVE   Leukocytes, UA TRACE (A) NEGATIVE   RBC / HPF 0-5 0 - 5 RBC/hpf   WBC, UA 0-5 0 - 5 WBC/hpf   Bacteria, UA RARE (A) NONE SEEN   Squamous  Epithelial / LPF 0-5 0 - 5   Mucus PRESENT    Hyaline Casts, UA PRESENT     Comment: Performed at [facility], [address], [place], [place] [address]  CK     Status: None   Collection Time: 07/12/2017  3:42 AM  Result Value Ref Range   Total CK 163 38 - 234 U/L    Comment: Performed at [facility], [address] [place], [place] [address]  Acetaminophen level     Status: Abnormal    Collection Time: 07/11/2017  3:42 AM  Result Value Ref Range   Acetaminophen (Tylenol), Serum <10 (L) 10 - 30 ug/mL    Comment: (NOTE) Therapeutic concentrations vary significantly. A range of 10-30 ug/mL  may be an effective concentration for many patients. However, some  are best treated at concentrations outside of this range. Acetaminophen concentrations >150 ug/mL at 4 hours after ingestion  and >50 ug/mL at 12 hours after ingestion are often associated with  toxic reactions. Performed at [facility], [address] [place], [place] [address]   Salicylate level     Status: None   Collection Time: 07/23/2017  3:42 AM  Result Value Ref Range   Salicylate Lvl <4.1 2.8 - 30.0 mg/dL    Comment: Performed at [facility], [address], [place], [place] [address]  Ethanol     Status: None   Collection Time: 07/12/2017  3:42 AM  Result Value Ref Range   Alcohol, Ethyl (B) <10 <10 mg/dL    Comment: (NOTE) Lowest detectable limit for serum alcohol is 10 mg/dL. For medical purposes only. Performed at [facility], [address], [place], [place] [address]   Blood gas, arterial ([facility] & [facility] ONLY)     Status: Abnormal   Collection Time: 07/12/2017  3:42 AM  Result Value Ref Range   O2 Content 100.0 L/min   Delivery systems NON-REBREATHER OXYGEN MASK    pH, Arterial 7.133 (LL) 7.350 - 7.450    Comment: CRITICAL RESULT CALLED TO, READ BACK BY AND VERIFIED WITH:  DR. Vevelyn Francois 07/14/2017 0353 KRW    pCO2 arterial 31.4 (L) 32.0 - 48.0 mmHg   pO2, Arterial 82.2 (L) 83.0 - 108.0 mmHg   Bicarbonate 11.2 (L) 20.0 - 28.0 mmol/L   Acid-base deficit 17.2 (H) 0.0 - 2.0 mmol/L   O2 Saturation 91.4 %   Patient temperature 37.0    Collection site RIGHT RADIAL    Drawn by [phone]    Sample type ARTERIAL DRAW    Allens test (pass/fail) PASS PASS    Comment: Performed at [facility], [address] [place], [place] [address]  Ammonia     Status: Abnormal   Collection Time: 07/15/2017  3:42 AM  Result Value  Ref Range   Ammonia 97 (H) 9 - 35 umol/L    Comment: Performed at [facility], [address] [place], [place] [address]  Lipase, blood     Status: Abnormal   Collection Time: 07/16/2017  3:42 AM  Result Value Ref Range   Lipase 122 (H) 11 - 51 U/L    Comment: Performed at [facility], [address], [place], [place] [address]  CBG monitoring, ED     Status: Abnormal   Collection Time: 07/04/2017  3:46 AM  Result Value Ref Range   Glucose-Capillary 320 (H) 65 - 99 mg/dL  I-stat troponin, ED     Status: None   Collection Time: 07/11/2017  3:54 AM  Result Value  Ref Range   Troponin i, poc 0.00 0.00 - 0.08 ng/mL   Comment 3            Comment: Due to the release kinetics of cTnI, a negative result within the first hours of the onset of symptoms does not rule out myocardial infarction with certainty. If myocardial infarction is still suspected, repeat the test at appropriate intervals.   D-dimer, quantitative (not at [facility])     Status: Abnormal   Collection Time: 07/16/2017  3:55 AM  Result Value Ref Range   D-Dimer, Quant >20.00 (H) 0.00 - 0.50 ug/mL-FEU    Comment: (NOTE) At the manufacturer cut-off of 0.50 ug/mL FEU, this assay has been documented to exclude PE with a sensitivity and negative predictive value of 97 to 99%.  At this time, this assay has not been approved by the FDA to exclude DVT/VTE. Results should be correlated with clinical presentation. Performed at [facility], [address] [place], [place] [address]   I-Stat CG4 Lactic Acid, ED     Status: Abnormal   Collection Time: 07/24/2017  3:56 AM  Result Value Ref Range   Lactic Acid, Venous 7.24 (HH) 0.5 - 1.9 mmol/L   Comment NOTIFIED PHYSICIAN   I-stat chem 8, ed     Status: Abnormal   Collection Time:   4:00 AM  Result Value Ref Range   Sodium 135 135 - 145 mmol/L   Potassium 6.1 (H) 3.5 - 5.1 mmol/L   Chloride 107 101 - 111 mmol/L   BUN 35 (H) 6 - 20 mg/dL   Creatinine, Ser 2.10 (H) 0.44 - 1.00 mg/dL    Glucose, Bld 315 (H) 65 - 99 mg/dL   Calcium, Ion 1.07 (L) 1.15 - 1.40 mmol/L   TCO2 17 (L) 22 - 32 mmol/L   Hemoglobin 13.3 12.0 - 15.0 g/dL   HCT 39.0 36.0 - 46.0 %  Blood culture (routine x 2)     Status: None (Preliminary result)   Collection Time: 07/10/2017  4:08 AM  Result Value Ref Range   Specimen Description      BLOOD LEFT [place] BOTTLES DRAWN AEROBIC AND ANAEROBIC   Special Requests      Blood Culture results may not be optimal due to an inadequate volume of blood received in culture bottles   Culture      NO GROWTH < 12 HOURS Performed at [facility], [address], [place], [place] [address]    Report Status PENDING   Type and screen [facility]     Status: None   Collection Time: 07/26/2017  4:29 AM  Result Value Ref Range   ABO/RH(D) A POS    Antibody Screen NEG    Sample Expiration       Performed at [facility], [address], [place], [place] [address]   I-Stat CG4 Lactic Acid, ED     Status: Abnormal   Collection Time: 07/10/2017  5:52 AM  Result Value Ref Range   Lactic Acid, Venous 3.83 (HH) 0.5 - 1.9 mmol/L   Comment NOTIFIED PHYSICIAN    Personally reviewed CT scan and no free fluid, no free air, no signs of internal hernia, no signs of volvulus, colon somewhat dilated but < 10 at the cecum and smaller everywhere else, the sigmoid has some diverticula but no diverticulitis, stool in sigmoid and the cecum, RLL with consolidation   Ct Abdomen Pelvis Wo Contrast  Result Date: 07/16/2017 CLINICAL DATA:  Acute onset of altered  level of consciousness. Patient is semi-responsive. Sepsis. Abdominal distention. EXAM: CT ABDOMEN AND PELVIS WITHOUT CONTRAST TECHNIQUE: Multidetector CT imaging of the abdomen and pelvis was performed following the standard protocol without IV contrast. COMPARISON:  CT of the abdomen and pelvis performed 11/21/2009 FINDINGS: Lower chest: There is partial consolidation of the right lower lobe, compatible with pneumonia. The  visualized portions of the mediastinum are unremarkable. Hepatobiliary: The liver is unremarkable in appearance. The gallbladder is unremarkable in appearance. The common bile duct remains normal in caliber. Pancreas: The pancreas is within normal limits. Spleen: The spleen is unremarkable in appearance. Adrenals/Urinary Tract: The adrenal glands are unremarkable in appearance. The kidneys are within normal limits. There is no evidence of hydronephrosis. No renal or ureteral stones are identified. No perinephric stranding is seen. Stomach/Bowel: The patient is status post sleeve gastrectomy. The stomach is otherwise unremarkable. The small bowel is within normal limits. The patient is status post appendectomy. The colon is largely distended with fluid and air, and is grossly unremarkable in appearance. Vascular/Lymphatic: Scattered calcification is seen along the abdominal aorta and its branches. The abdominal aorta is otherwise grossly unremarkable. The inferior vena cava is grossly unremarkable. No retroperitoneal lymphadenopathy is seen. No pelvic sidewall lymphadenopathy is identified. Reproductive: The bladder is mildly distended and grossly unremarkable. The patient is status post hysterectomy. No suspicious adnexal masses are seen. Other: No additional soft tissue abnormalities are seen. Musculoskeletal: No acute osseous abnormalities are identified. Decompression is noted at the lower lumbar spine. There is chronic osseous fusion at L5-S1. The visualized musculature is unremarkable in appearance. IMPRESSION: 1. Colon largely distended with fluid and air; this may correspond to the patient's abdominal distention. No evidence for obstruction. 2. Right lower lobe pneumonia. Aortic Atherosclerosis (ICD10-I70.0). Electronically Signed   By: Garald Balding M.D.   On: 07/31/2017 05:49   Ct Head Wo Contrast  Result Date: 07/20/2017 CLINICAL DATA:  67 y/o  F; encephalopathy. EXAM: CT HEAD WITHOUT CONTRAST  TECHNIQUE: Contiguous axial images were obtained from the base of the skull through the vertex without intravenous contrast. COMPARISON:  None. FINDINGS: Brain: No evidence of acute infarction, hemorrhage, hydrocephalus, extra-axial collection or mass lesion/mass effect. Vascular: No hyperdense vessel or unexpected calcification. Skull: Normal. Negative for fracture or focal lesion. Sinuses/Orbits: No acute finding. Other: None. IMPRESSION: No acute intracranial abnormality. Unremarkable CT of the head for age. Electronically Signed   By: Kristine Garbe M.D.   On: 07/09/2017 05:49   Dg Chest Portable 1 View  Result Date: 07/26/2017 CLINICAL DATA:  67 y/o F; unresponsive patient with low oxygen saturation and abdominal distention. EXAM: PORTABLE CHEST 1 VIEW COMPARISON:  10/21/2013 chest radiograph FINDINGS: Normal cardiac silhouette. Aortic atherosclerosis with calcification. Low lung volumes accentuate pulmonary markings. No focal consolidation. No pleural effusion or pneumothorax. Anterior cervical discectomy and fusion hardware. Post right shoulder rotator cuff repair. IMPRESSION: Low lung volumes.  No acute pulmonary process identified. Electronically Signed   By: Kristine Garbe M.D.   On: 07/09/2017 04:13   Dg Abd Portable 2 Views  Result Date: 07/31/2017 CLINICAL DATA:  67 y/o F; unresponsive patient with abdominal distention. EXAM: PORTABLE ABDOMEN - 2 VIEW COMPARISON:  None. FINDINGS: Mild diffuse air-filled dilatation of large bowel. Degenerative changes of the lumbar spine and mild levocurvature. Surgical sutures project over the right lower quadrant. Mild osteoarthrosis of the hip joints. IMPRESSION: Mild diffuse air-filled distention of large bowel may represent constipation, distal obstruction, or pseudo-obstruction. Electronically Signed   By: Mia Creek  [place] M.D.   On: 07/26/2017 04:16     Assessment & Plan:  Natalie Dodson is a 67 y.o. female with septic  shock of unknown etiology, possible pneumonia at this time. Came into help Dr. Wyvonnia Dusky and to rule out abdominal source for shock.  Patient on levophed and BP on lower limb at 26-33H systolic.  No abdominal or surgical source identified at this time.   -Emergent arterial line placed (see separate note)  -No acute surgical intervention needed at this time based on the information available and current CT/ exam  -Treat PNA as possible source -Distended colon but no signs of ischemia or perforation, cecum is 10 cm, has history of diarrhea on imodium, no WBC, could have a colitis/ C dif that is not evident yet but developing? Difficult to know -Noncontrasted CT and could have PE -Dr. Wyvonnia Dusky transferring to [facility]    All questions were answered to the satisfaction of the patient and family.  Virl Cagey 07/30/2017, 6:43 AM

## 2017-07-18 NOTE — Procedures (Signed)
Procedure Note  07/11/2017    Preoperative Diagnosis: Septic shock, lactic acidosis, pneumonia    Postoperative Diagnosis: Same   Procedure(s) Performed: Arterial line placement    Surgeon: Lanell Matar. Constance [place], MD   Assistants: None   Anesthesia: None    Complications: None    Indications: Natalie Dodson is a 67 y.o. with hypotension and shock potentially related to pneumonia.  She is on levophed at 10 and requiring increasing pressors with a blood pressure cuff on the left lower limb. I discussed the risk and benefits of placement of the central line with the daughter, including but not limited to bleeding, infection, injury to the arterial. She has given verbal consent for the procedure and this is also emergent in nature due to the patient's hypotension.   Procedure: Wearing sterile gloves. The left arm was prepped and draped and a 20 gauge arrow arterial line was used to access the artery.  There was flash of blood, but the catheter would not pass into the artery. An ultrasound was used to assess the artery and the line, and the artery was about 1.62mm in size and slightly calcified. I attempted to re-stick to get the catheter to pass, but this was unsuccessful.  The left arm was dressed with a pressure dressing. The right arm was then accessed with an [place]. This artery was larger and the pulse was evident. The right arm was prepped and a 20 gauge arrow arterial line.  The catheter threaded easily, and a good wave form was noted.  The arterial line was sutured in with a 3-0 prolene suture. The bandage was applied. The patient tolerated the procedure well. Her blood pressure was in the 63F systolic.   Natalie Labrum, MD [facility] [address] [place], [place] [address] [phone] (office)

## 2017-07-18 NOTE — Progress Notes (Signed)
Pharmacy Antibiotic Note  Natalie Dodson is a 67 y.o. female admitted on 07/13/2017 with pneumonia.  Pharmacy has been consulted for Zosyn dosing.  Plan: Zosyn 3.375g IV q8h (4 hour infusion).  Height: 5\' 5"  (165.1 cm) Weight: 179 lb (81.2 kg) IBW/kg (Calculated) : 57  Temp (24hrs), Avg:94.8 F (34.9 C), Min:92.7 F (33.7 C), Max:97.6 F (36.4 C)  Recent Labs  Lab 07/23/2017 0342 07/20/2017 0356 07/08/2017 0400 08/01/2017 0552 07/03/2017 0828  WBC 10.0  --   --   --  9.6  CREATININE 2.16*  --  2.10*  --  2.04*  LATICACIDVEN  --  7.24*  --  3.83* 5.0*    Estimated Creatinine Clearance: 28.9 mL/min (A) (by C-G formula based on SCr of 2.04 mg/dL (H)).    Allergies  Allergen Reactions  . Vancomycin Hives and Other (See Comments)    Can spread all over the body. Can spread all over the body.    Antimicrobials this admission: Zyvox 5/17 x 1 Zosyn 5/17 >>   Microbiology results:  5/17 UCx: being drawn 5/17 Sputum: being drawn   Thank you for allowing pharmacy to be a part of this patient's care.  Corinda Gubler, PharmD, [facility] 07/17/2017 1:05 PM

## 2017-07-18 NOTE — Procedures (Signed)
Arterial Catheter Insertion Procedure Note Natalie Dodson [id number] [birth year]  Procedure: Insertion of Arterial Catheter  Indications: Blood pressure monitoring and Frequent blood sampling  Procedure Details Consent: Unable to obtain consent because of emergent medical necessity. Time Out: Verified patient identification, verified procedure, site/side was marked, verified correct patient position, special equipment/implants available, medications/allergies/relevent history reviewed, required imaging and test results available.  Performed  Maximum sterile technique was used including antiseptics, cap, gloves, gown, [place] hygiene, mask and sheet. Skin prep: Chlorhexidine; local anesthetic administered 20 gauge catheter was inserted into right radial artery using the Seldinger technique.  Evaluation Blood flow good; BP tracing good. Complications: No apparent complications.   Natalie Dodson 07/17/2017

## 2017-07-18 NOTE — ED Notes (Signed)
CRITICAL VALUE ALERT  Critical Value: lactic acid  5.0  Date & Time Notied: 07/25/2017 @0914   Provider Notified: Mcmanus  Orders Received/Actions taken: none

## 2017-07-18 NOTE — Progress Notes (Signed)
Communicated with family.  Husband updated.  Informed of need for HD and he was agreeable to short term HD.  After discussion, decision was made to proceed with dialysis but make patient a LCB with no CPR/cardioversion.  Rush Farmer, M.D. [facility] Pulmonary/Critical Care Medicine. Pager: [phone]. After hours pager: [phone].

## 2017-07-18 NOTE — ED Notes (Signed)
Pt left [facility] with [facility] en route to [facility]. Belongings given to family.

## 2017-07-18 NOTE — Progress Notes (Signed)
Initial Nutrition Assessment  DOCUMENTATION CODES:   Not applicable  INTERVENTION:    Vital AF 1.2 at 55 ml/h (1320 ml per day)   Provides 1584 kcal, 99 gm protein, 1071 ml free water daily  NUTRITION DIAGNOSIS:   Inadequate oral intake related to inability to eat as evidenced by NPO status.  GOAL:   Patient will meet greater than or equal to 90% of their needs  MONITOR:   Vent status, Labs, TF tolerance, I & O's  REASON FOR ASSESSMENT:   Ventilator, Consult Enteral/tube feeding initiation and management  ASSESSMENT:   67 yo female with PMH of arthritis, GERD, fibromyalgia, chronic diarrhea, and lap band gastric bypass surgery (2010) who was admitted on 5/17 with HCAP and VDRF.   Discussed patient with RN and CCM team today. Received MD Consult for TF initiation and management.  No recent weight loss noted. Family at bedside confirm no nutrition problems PTA.  Patient is currently intubated on ventilator support MV: 8.6 L/min Temp (24hrs), Avg:94.8 F (34.9 C), Min:92.7 F (33.7 C), Max:97.6 F (36.4 C)  MAP 83 Labs reviewed. Lactic acid 5 (H), alk phos 158 (H) CBG's: 320-162 Medications reviewed and include Versed, Precedex, Levophed, and vasopressin.   NUTRITION - FOCUSED PHYSICAL EXAM:    Most Recent Value  Orbital Region  No depletion  Upper Arm Region  No depletion  Thoracic and Lumbar Region  Unable to assess  Buccal Region  No depletion  Temple Region  No depletion  Clavicle Bone Region  No depletion  Clavicle and Acromion Bone Region  No depletion  Scapular Bone Region  Unable to assess  Dorsal Weatherly  Unable to assess  Patellar Region  Unable to assess  Anterior Thigh Region  Unable to assess  Posterior Calf Region  No depletion  Edema (RD Assessment)  Mild  Hair  Reviewed  Eyes  Unable to assess  Mouth  Unable to assess  Skin  Reviewed  Nails  Unable to assess       Diet Order:   Diet Order           Diet NPO time specified  Diet  effective now          EDUCATION NEEDS:   No education needs have been identified at this time  Skin:  Skin Assessment: Reviewed RN Assessment  Last BM:  unknown  Height:   Ht Readings from Last 1 Encounters:  07/10/2017 '5\' 5"'$  (1.651 m)    Weight:   Wt Readings from Last 1 Encounters:  07/26/2017 179 lb (81.2 kg)    Ideal Body Weight:  56.8 kg  BMI:  Body mass index is 29.79 kg/m.  Estimated Nutritional Needs:   Kcal:  1540  Protein:  95-110 gm  Fluid:  1.5 L    Molli Barrows, RD, LDN, [place] Pager [phone] After Hours Pager [phone]

## 2017-07-18 NOTE — ED Notes (Signed)
Report given to [facility]. 

## 2017-07-18 NOTE — ED Notes (Addendum)
Date and time results received: 07/10/2017 0918  Test: Troponin Critical Value: 0.05  Name of Provider Notified: Dr. Thurnell Garbe  Orders Received? Or Actions Taken?: None given at this time

## 2017-07-18 NOTE — ED Notes (Signed)
[facility] arrived to transport patient.  

## 2017-07-18 NOTE — ED Provider Notes (Signed)
[facility] EMERGENCY DEPARTMENT Provider Note   CSN: [id number] Arrival date & time: 07/11/2017  0334     History   Chief Complaint Chief Complaint  Patient presents with  . Altered Mental Status    HPI Natalie Dodson is a 67 y.o. female.  Level 5 caveat for altered mental status.  Patient found sitting on toilet slumped over with snoring respirations.  EMS gave Narcan with some response in mental status.  Patient arrives very confused repeating these "mind is a little piece".  Patient does have narcotics on her home medication list but family denies any overdose.  Patient appears to be very pale, tachycardic, tachypneic and hypotensive. She c/o pain in her "monkey" and is holding her abdomen. She is afebrile.  No recent history of rectal bleeding or vomiting.  She has had multiple abdominal surgeries in the past.  No alcohol or drug use per family.  Denies any use of possibility of overdose.  Patient does have a history of previous abdominal surgeries, vomiting, hypertension.  The history is provided by the patient and the EMS personnel. The history is limited by the condition of the patient.  Altered Mental Status      Past Medical History:  Diagnosis Date  . Arthritis   . Chronic diarrhea    d/t lap band  . Fibromyalgia   . GERD (gastroesophageal reflux disease)   . MRSA (methicillin resistant staph aureus) culture positive 11-06-11   cultured from back surgery hardware-hardware removed  . PONV (postoperative nausea and vomiting) 11-06-11   nausea with anesthesia    Patient Active Problem List   Diagnosis Date Noted  . Essential hypertension 05/12/2017  . Vitamin D deficiency 10/27/2015  . Spinal stenosis in cervical region 12/05/2014    Class: Chronic  . Stenosis of cervical spine 12/05/2014  . Spinal stenosis, lumbar region, with neurogenic claudication 08/26/2014  . Radiculopathy of lumbosacral region 08/05/2014  . Adjustment disorder with anxious mood 02/23/2014  .  Fibromyalgia 02/21/2014  . Osteopenia 08/21/2012  . Diarrhea 11/11/2010  . Morbid obesity ([place]) 10/31/2010    Past Surgical History:  Procedure Laterality Date  . ABDOMINAL HYSTERECTOMY     then ovarian removal  . ANTERIOR CERVICAL DECOMP/DISCECTOMY FUSION N/A 12/05/2014   Procedure: C4 Corpectomy with decompression C3-4 and C4-5 neuroforamen, allograft, ilac crest bone graft, cervical plate and screws;  Surgeon: Jessy Oto, MD;  Location: [place];  Service: Orthopedics;  Laterality: N/A;  . APPENDECTOMY    . BACK SURGERY     X6  . BILATERAL OOPHORECTOMY    . CARPAL TUNNEL RELEASE     right [place]  . CERVICAL FUSION    . COLONOSCOPY N/A 08/18/2012   RMR: Rectal polyp-removed. Colonic diverticulosis s/p segmental biopsies and stool sampling  . ELBOW SURGERY  11-06-11   bilateral "tendon release"  . HERNIA REPAIR     umbilical  . LAPAROSCOPIC GASTRIC BANDING  12/12/08  . LUMBAR LAMINECTOMY/DECOMPRESSION MICRODISCECTOMY N/A 08/26/2014   Procedure: RIGHT L2-3 LATERAL RECESS DECOMPRESSION, BILATERAL L3-4 LATERAL RECESS DECOMPRESSION, BILATERAL L4 FORAMINOTOMY;  Surgeon: Jessy Oto, MD;  Location: [place];  Service: Orthopedics;  Laterality: N/A;  . removal of laparoscopic gastric band  01/02/10   port removed prior to this surgery  . ROTATOR CUFF REPAIR     bilateral  . TONSILLECTOMY    . 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 vertical sleeve gastrectomy N/A      OB History   None  Home Medications    Prior to Admission medications   Medication Sig Start Date End Date Taking? Authorizing Provider  ALPRAZolam (XANAX) 0.5 MG tablet TAKE ONE TABLET BY MOUTH TWICE DAILY AS NEEDED. MUST LAST 30 DAYS. 07/01/17   Mikey Kirschner, MD  Calcium Carbonate (CALCIUM 600 PO) Take by mouth. 2 a day    [provider]  Cholecalciferol (VITAMIN D PO) Take by mouth. Vit d3 2,000 units daily    [provider]  esomeprazole (NEXIUM) 40 MG capsule Take 40 mg by mouth 2 (two)  times daily as needed (for acid reflux).  06/29/14   [provider]  gabapentin (NEURONTIN) 300 MG capsule Take 1 capsule (300 mg total) by mouth 3 (three) times daily. 12/20/15   Nilda Simmer, NP  lisinopril (PRINIVIL,ZESTRIL) 5 MG tablet Take 1 tablet (5 mg total) by mouth daily. 05/12/17   Nilda Simmer, NP  methocarbamol (ROBAXIN) 500 MG tablet  09/17/16   [provider]  Multiple Vitamin (MULTIVITAMIN) tablet Take 1 tablet by mouth daily. 2 a day    [provider]  nystatin (MYCOSTATIN) 100000 UNIT/ML suspension Take 5 mLs (500,000 Units total) by mouth 4 (four) times daily. 05/12/17   Nilda Simmer, NP  oxyCODONE-acetaminophen (PERCOCET) 10-325 MG tablet Take 1 tablet by mouth every 4 (four) hours as needed for pain.    [provider]  phentermine (ADIPEX-P) 37.5 MG tablet Take 1 tablet (37.5 mg total) by mouth daily before breakfast. 05/30/17   Nilda Simmer, NP    Family History Family History  Problem Relation Age of Onset  . Colon cancer Neg Hx     Social History Social History   Tobacco Use  . Smoking status: Never Smoker  . Smokeless tobacco: Never Used  Substance Use Topics  . Alcohol use: No  . Drug use: No     Allergies   Vancomycin   Review of Systems Review of Systems  Unable to perform ROS: Acuity of condition     Physical Exam Updated Vital Signs BP 94/61   Pulse 92   Temp (!) 94.8 F (34.9 C) (Rectal)   Resp (!) 30   Wt 81.2 kg (179 lb)   SpO2 98%   BMI 31.71 kg/m   Physical Exam  Constitutional: She appears distressed.  Moaning, repeating "mind is a little piece" She does follow commands and moves all extremities,  HENT:  Head: Normocephalic and atraumatic.  Eyes: Pupils are equal, round, and reactive to light. Conjunctivae and EOM are normal.  Dry mucous membranes, pale conjunctiva  Neck: Normal range of motion. Neck supple.  Cardiovascular: Normal rate and intact distal pulses.    No murmur heard. Tachycardic in the 100s  Pulmonary/Chest: Effort normal. No respiratory distress. She exhibits no tenderness.  Tachypneic, scattered rhonchi  Abdominal: Soft. She exhibits distension. There is tenderness. There is guarding. There is no rebound.  Multiple surgical scars, diffusely tender, distended  Musculoskeletal: Normal range of motion. She exhibits no edema, tenderness or deformity.  Neurological: She is alert.  Confused, moves all extremities, no focal deficits Follows some commands  Skin: Skin is dry. Capillary refill takes more than 3 seconds. No rash noted. There is pallor.     ED Treatments / Results  Labs (all labs ordered are listed, but only abnormal results are displayed) Labs Reviewed  CBC WITH DIFFERENTIAL/PLATELET - Abnormal; Notable for the following components:      Result Value   MCHC 29.6 (*)  RDW 15.7 (*)    Lymphs Abs 5.2 (*)    All other components within normal limits  COMPREHENSIVE METABOLIC PANEL - Abnormal; Notable for the following components:   Sodium 134 (*)    Potassium 6.0 (*)    CO2 15 (*)    Glucose, Bld 337 (*)    BUN 30 (*)    Creatinine, Ser 2.16 (*)    Calcium 8.1 (*)    Total Protein 6.3 (*)    Albumin 3.0 (*)    AST 373 (*)    ALT 186 (*)    Alkaline Phosphatase 129 (*)    GFR calc non Af Amer 23 (*)    GFR calc Af Amer 26 (*)    All other components within normal limits  ACETAMINOPHEN LEVEL - Abnormal; Notable for the following components:   Acetaminophen (Tylenol), Serum <10 (*)    All other components within normal limits  BLOOD GAS, ARTERIAL - Abnormal; Notable for the following components:   pH, Arterial 7.133 (*)    pCO2 arterial 31.4 (*)    pO2, Arterial 82.2 (*)    Bicarbonate 11.2 (*)    Acid-base deficit 17.2 (*)    All other components within normal limits  AMMONIA - Abnormal; Notable for the following components:   Ammonia 97 (*)    All other components within normal limits  LIPASE, BLOOD -  Abnormal; Notable for the following components:   Lipase 122 (*)    All other components within normal limits  D-DIMER, QUANTITATIVE (NOT AT [facility]) - Abnormal; Notable for the following components:   D-Dimer, Quant >20.00 (*)    All other components within normal limits  I-STAT CHEM 8, ED - Abnormal; Notable for the following components:   Potassium 6.1 (*)    BUN 35 (*)    Creatinine, Ser 2.10 (*)    Glucose, Bld 315 (*)    Calcium, Ion 1.07 (*)    TCO2 17 (*)    All other components within normal limits  CBG MONITORING, ED - Abnormal; Notable for the following components:   Glucose-Capillary 320 (*)    All other components within normal limits  I-STAT CG4 LACTIC ACID, ED - Abnormal; Notable for the following components:   Lactic Acid, Venous 7.24 (*)    All other components within normal limits  I-STAT CG4 LACTIC ACID, ED - Abnormal; Notable for the following components:   Lactic Acid, Venous 3.83 (*)    All other components within normal limits  URINE CULTURE  CULTURE, BLOOD (ROUTINE X 2)  CULTURE, BLOOD (ROUTINE X 2)  TROPONIN I  CK  SALICYLATE LEVEL  ETHANOL  URINALYSIS, ROUTINE W REFLEX MICROSCOPIC  I-STAT TROPONIN, ED  POC OCCULT BLOOD, ED  TYPE AND SCREEN    EKG EKG Interpretation  Date/Time:  Friday Jul 18 2017 03:41:35 EDT Ventricular Rate:  102 PR Interval:    QRS Duration: 89 QT Interval:  341 QTC Calculation: 445 R Axis:   54 Text Interpretation:  Sinus tachycardia Ventricular premature complex Aberrant conduction of SV complex(es) Low voltage, precordial leads Abnormal R-wave progression, early transition Rate faster peaked T waves  Confirmed by Ezequiel Essex [phone]) on 07/20/2017 6:09:47 AM   Radiology Dg Chest Portable 1 View  Result Date: 07/10/2017 CLINICAL DATA:  67 y/o F; unresponsive patient with low oxygen saturation and abdominal distention. EXAM: PORTABLE CHEST 1 VIEW COMPARISON:  10/21/2013 chest radiograph FINDINGS: Normal cardiac  silhouette. Aortic atherosclerosis with calcification. Low lung volumes accentuate  pulmonary markings. No focal consolidation. No pleural effusion or pneumothorax. Anterior cervical discectomy and fusion hardware. Post right shoulder rotator cuff repair. IMPRESSION: Low lung volumes.  No acute pulmonary process identified. Electronically Signed   By: Kristine Garbe M.D.   On: 07/08/2017 04:13   Dg Abd Portable 2 Views  Result Date: 07/22/2017 CLINICAL DATA:  67 y/o F; unresponsive patient with abdominal distention. EXAM: PORTABLE ABDOMEN - 2 VIEW COMPARISON:  None. FINDINGS: Mild diffuse air-filled dilatation of large bowel. Degenerative changes of the lumbar spine and mild levocurvature. Surgical sutures project over the right lower quadrant. Mild osteoarthrosis of the hip joints. IMPRESSION: Mild diffuse air-filled distention of large bowel may represent constipation, distal obstruction, or pseudo-obstruction. Electronically Signed   By: Kristine Garbe M.D.   On: 07/25/2017 04:16    Procedures .Central Line Date/Time: 07/24/2017 5:02 AM Performed by: Ezequiel Essex, MD Authorized by: Ezequiel Essex, MD   Consent:    Consent obtained:  Verbal and emergent situation   Consent given by:  Patient and healthcare agent   Risks discussed:  Arterial puncture, incorrect placement, infection and bleeding Pre-procedure details:    Larkin hygiene: Dewald hygiene performed prior to insertion     Sterile barrier technique: All elements of maximal sterile technique followed     Skin preparation:  ChloraPrep   Skin preparation agent: Skin preparation agent completely dried prior to procedure   Sedation:    Sedation type:  Anxiolysis Anesthesia (see MAR for exact dosages):    Anesthesia method:  Local infiltration   Local anesthetic:  Lidocaine 1% w/o epi Procedure details:    Location:  R femoral   Patient position:  Flat   Procedural supplies:  Triple lumen   Catheter size:  7.5  Fr   Landmarks identified: yes     Ultrasound guidance: yes     Sterile ultrasound techniques: Sterile gel and sterile probe covers were used     Number of attempts:  1   Successful placement: yes   Post-procedure details:    Post-procedure:  Dressing applied   Assessment:  Blood return through all ports   Patient tolerance of procedure:  Tolerated well, no immediate complications   (including critical care time)  Medications Ordered in ED Medications  sodium chloride 0.9 % bolus 1,000 mL (1,000 mLs Intravenous New Bag/Given 07/15/2017 0350)  sodium chloride 0.9 % bolus 1,000 mL (1,000 mLs Intravenous New Bag/Given 07/26/2017 0407)  piperacillin-tazobactam (ZOSYN) IVPB 3.375 g (3.375 g Intravenous New Bag/Given 07/13/2017 0413)  calcium gluconate 1 g in sodium chloride 0.9 % 100 mL IVPB (1 g Intravenous New Bag/Given 07/03/2017 0409)  LORazepam (ATIVAN) injection 1 mg (1 mg Intravenous Given 07/06/2017 0350)  sodium chloride 0.9 % bolus 500 mL (0 mLs Intravenous Stopped 07/03/2017 0406)  sodium bicarbonate injection 100 mEq (100 mEq Intravenous Given 07/09/2017 0408)  fentaNYL (SUBLIMAZE) injection 50 mcg (50 mcg Intravenous Given 07/11/2017 0410)  fentaNYL (SUBLIMAZE) injection 50 mcg (50 mcg Intravenous Given 07/04/2017 0423)     Initial Impression / Assessment and Plan / ED Course  I have reviewed the triage vital signs and the nursing notes.  Pertinent labs & imaging results that were available during my care of the patient were reviewed by me and considered in my medical decision making (see chart for details).    Patient with altered mental status, hypotensive, tachycardic, afebrile.  Appears to have diffuse abdominal pain on exam. Metabolic acidosis present.  IVF given  Labs show peaked T waves  and EKG.  Hemoccult is negative.  Patient given IV fluids and broad spectrum antibiotics given after cultures obtained.  Code sepsis protocol followed and patient given 30 mL/kg of IV fluids. Acute abdominal  series shows very large distended colon.  No free air or obstruction.  Patient treated for possible intra-abdominal catastrophe given IV fluids, IV antibiotics.  Labs shows hyperkalemia with peaked T waves.  Patient was given bicarb and calcium.  She has a lactate of 7 and a pH of 7.1.  Femoral line placed for access emergently.  Broad-spectrum antibiotics and IV fluids given.  Discussed with Dr. Constance Haw of surgery on arrival.  She will come to evaluate patient.  Will attempt to stabilize patient for CT scan.  CT scan shows dilated colon but no free air and no obstruction.  Discussed with Dr. Constance Haw at bedside.  She does not believe there is any acute need to go to the OR.  She is placing arterial line.  Agrees with treating for septic shock with pneumonia and antibiotics.  D-dimer greater than 20.  Patient with apparent pneumonia on CT scan but cannot give contrast to assess for pulmonary embolism. Consider pulmonary infarct. Will start heparin now that patient is not going to the OR. She does not have any right heart strain on bedside echo but windows are poor.  Case discussed with critical care Dr. Lake Bells at [facility].  He accepts patient to [facility] ICU.  Agrees with not giving tPA at this time.  Patient maintaining airway mental status throughout ED course.  She states she is feeling "fine".  Denies any chest pain or shortness of breath.  O2 saturations 97% on 2 L.  No increased work of breathing. Continue IV heparin, IV antibiotics and IV fluids.  Holding TPA at this time as it is unclear the patient has massive PE even though this is certainly on the differential.  Patient awaiting bed at [facility], ICU.  Blood pressure has stabilized on both Levophed and vasopressin.  Dr. Thurnell Garbe aware of patient at shift change.  CRITICAL CARE Performed by: Ezequiel Essex Total critical care time: 100 minutes Critical care time was exclusive of separately billable procedures and treating  other patients. Critical care was necessary to treat or prevent imminent or life-threatening deterioration. Critical care was time spent personally by me on the following activities: development of treatment plan with patient and/or surrogate as well as nursing, discussions with consultants, evaluation of patient's response to treatment, examination of patient, obtaining history from patient or surrogate, ordering and performing treatments and interventions, ordering and review of laboratory studies, ordering and review of radiographic studies, pulse oximetry and re-evaluation of patient's condition.  Final Clinical Impressions(s) / ED Diagnoses   Final diagnoses:  Septic shock ([place])  Altered mental status, unspecified altered mental status type  Hyperkalemia  Community acquired pneumonia of right lower lobe of lung [facility])    ED Discharge Orders    None       Cordarious Zeek, Annie Main, MD 07/22/2017 [phone]

## 2017-07-18 NOTE — Procedures (Signed)
Intubation Procedure Note Natalie Dodson [id number] [birth year]  Procedure: Intubation Indications: Respiratory insufficiency  Procedure Details Consent: Unable to obtain consent because of emergent medical necessity. Time Out: Verified patient identification, verified procedure, site/side was marked, verified correct patient position, special equipment/implants available, medications/allergies/relevent history reviewed, required imaging and test results available.  Performed  Maximum sterile technique was used including gloves, [place] hygiene and mask.  MAC    Evaluation Hemodynamic Status: BP stable throughout; O2 sats: stable throughout Patient's Current Condition: stable Complications: No apparent complications Patient did tolerate procedure well. Chest X-ray ordered to verify placement.  CXR: pending.   Natalie Dodson 07/09/2017

## 2017-07-18 NOTE — ED Provider Notes (Signed)
 0815:  No ICU bed available at [facility]; will transfer to [facility] ED for Intensivist evaluation and higher level of care. Pt remains awake/alert, talking with family at bedside, resps without distress, VS improving after IVF/pressors. [facility] EDP Pollina given report.   0824:  Now informed ICU bed was available; will place orders.       Francine Graven, DO  [phone]

## 2017-07-18 NOTE — Progress Notes (Signed)
ANTICOAGULATION CONSULT NOTE - Preliminary  Pharmacy Consult for Heparin Indication: Pulmonary embolism  Allergies  Allergen Reactions  . Vancomycin Hives and Other (See Comments)    Can spread all over the body. Can spread all over the body.    Patient Measurements: Height: 5\' 5"  (165.1 cm) Weight: 179 lb (81.2 kg) IBW/kg (Calculated) : 57 HEPARIN DW (KG): 74.2   Vital Signs: Temp: 93 F (33.9 C) (05/17 0620) Temp Source: Rectal (05/17 0351) BP: 80/48 (05/17 0620) Pulse Rate: 102 (05/17 0620)  Labs: Recent Labs    07/28/2017 0342 07/07/2017 0400  HGB 12.0 13.3  HCT 40.5 39.0  PLT 255  --   CREATININE 2.16* 2.10*  CKTOTAL 163  --   TROPONINI <0.03  --    Estimated Creatinine Clearance: 28.1 mL/min (A) (by C-G formula based on SCr of 2.1 mg/dL (H)).  Medical History: Past Medical History:  Diagnosis Date  . Arthritis   . Chronic diarrhea    d/t lap band  . Fibromyalgia   . GERD (gastroesophageal reflux disease)   . MRSA (methicillin resistant staph aureus) culture positive 11-06-11   cultured from back surgery hardware-hardware removed  . PONV (postoperative nausea and vomiting) 11-06-11   nausea with anesthesia    Medications:   Assessment: 67 yo female brought by EMS to the ED with altered mental status, hypotension, tachycardia, and abdominal pain. Chest CT shows pneumonia, but cannot r/o PE. D-dimer is >20. Pharmacy has been consulted for IV heparin dosing.  Goal of Therapy:  Heparin level goal: 0.3-0.7 units/ml Monitor platelets by anticoagulation protocol: Yes   Plan:  Heparin bolus 3000 units IV Heparin infusion at 1250 units/hr Heparin level in 6-8 hrs   Preliminary review of pertinent patient information completed.  Forestine Na clinical pharmacist will complete review during morning rounds to assess the patient and finalize treatment regimen.  Norberto Sorenson, [facility] 07/30/2017,6:33 AM

## 2017-07-18 NOTE — Procedures (Signed)
OGT Placement By MD  Patient vomited during intubation  OGT placed under direct laryngoscopy and verified by auscultation  Rush Farmer, M.D. [facility] Pulmonary/Critical Care Medicine. Pager: [phone]. After hours pager: [phone].

## 2017-07-18 NOTE — Consult Note (Signed)
 [facility] Renal Consultation Note  Requesting MD: Yoacoub Indication for Consultation: AKI  HPI:  Natalie Dodson is a 67 y.o. female with a past medical history significant for fibromyalgia, with chronic pain syndrome, and also chronic diarrhea with history of multiple abdominal surgeries so was transferred to.  Apparently she was doing well and cooked dinner last night.  She was brought in to the hospital at any pain in the middle of the night unresponsive and snoring.  She was given Narcan and she did arouse.  However, she remained altered and hypotensive [place].  She was on lisinopril prior to admission.  Creatinine upon arrival was 2.16 and was noted to be normal on 05/13/2017.  Just over the last 12 hours creatinine is worsened to 2.47.  No urine output has been recorded.  Patient is noted to have a refractory acidosis pH of 7.0 and also potassium of 6.0, bicarb of 16.  Lactate originally 7.2, trended down to 3.8 but most recently 5.0.  Blood pressure remains low-   On pressors-is intubated- CVP anywhere from 11-15.  Multiple family members at the bedside.  Patient has been made limited code with no CPR.   With these laboratory abnormalities and no urine output we are consulted to do CRRT-  dialysis catheter has been placed  Creat  Date/Time Value Ref Range Status  01/11/2014 07:27 AM 0.75 0.50 - 1.10 mg/dL Final  08/03/2012 12:00 AM 0.76 0.50 - 1.10 mg/dL Final   Creatinine, Ser  Date/Time Value Ref Range Status  07/28/2017 03:14 PM 2.47 (H) 0.44 - 1.00 mg/dL Final   08:28 AM 2.04 (H) 0.44 - 1.00 mg/dL Final  07/10/2017 04:00 AM 2.10 (H) 0.44 - 1.00 mg/dL Final  07/04/2017 03:42 AM 2.16 (H) 0.44 - 1.00 mg/dL Final  05/13/2017 08:48 AM 0.94 0.57 - 1.00 mg/dL Final  05/03/2016 10:06 AM 0.82 0.57 - 1.00 mg/dL Final  05/26/2015 02:16 PM 0.89 0.44 - 1.00 mg/dL Final  04/24/2015 10:54 AM 0.82 0.57 - 1.00 mg/dL Final  12/06/2014 04:12 AM 0.80 0.44 - 1.00 mg/dL  Final  12/02/2014 01:46 PM 0.82 0.44 - 1.00 mg/dL Final  08/24/2014 01:26 PM 0.85 0.44 - 1.00 mg/dL Final  10/21/2013 12:41 PM 0.81 0.50 - 1.10 mg/dL Final  11/13/2011 04:15 AM 0.75 0.50 - 1.10 mg/dL Final  11/06/2011 09:30 AM 0.81 0.50 - 1.10 mg/dL Final  01/02/2010 12:35 PM 0.83 0.4 - 1.2 mg/dL Final  11/27/2009 04:58 PM 0.71 0.4 - 1.2 mg/dL Final  11/22/2009 05:05 AM 0.77 0.4 - 1.2 mg/dL Final  11/21/2009 08:26 AM 0.95 0.4 - 1.2 mg/dL Final  12/06/2008 01:35 PM 0.81 0.4 - 1.2 mg/dL Final     PMHx:   Past Medical History:  Diagnosis Date  . Arthritis   . Chronic diarrhea    d/t lap band  . Fibromyalgia   . GERD (gastroesophageal reflux disease)   . MRSA (methicillin resistant staph aureus) culture positive 11-06-11   cultured from back surgery hardware-hardware removed  . PONV (postoperative nausea and vomiting) 11-06-11   nausea with anesthesia    Past Surgical History:  Procedure Laterality Date  . ABDOMINAL HYSTERECTOMY     then ovarian removal  . ANTERIOR CERVICAL DECOMP/DISCECTOMY FUSION N/A 12/05/2014   Procedure: C4 Corpectomy with decompression C3-4 and C4-5 neuroforamen, allograft, ilac crest bone graft, cervical plate and screws;  Surgeon: Jessy Oto, MD;  Location: [facility];  Service: Orthopedics;  Laterality: N/A;  . APPENDECTOMY    .  BACK SURGERY     X6  . BILATERAL OOPHORECTOMY    . CARPAL TUNNEL RELEASE     right [place]  . CERVICAL FUSION    . COLONOSCOPY N/A 08/18/2012   RMR: Rectal polyp-removed. Colonic diverticulosis s/p segmental biopsies and stool sampling  . ELBOW SURGERY  11-06-11   bilateral "tendon release"  . HERNIA REPAIR     umbilical  . LAPAROSCOPIC GASTRIC BANDING  12/12/08  . LUMBAR LAMINECTOMY/DECOMPRESSION MICRODISCECTOMY N/A 08/26/2014   Procedure: RIGHT L2-3 LATERAL RECESS DECOMPRESSION, BILATERAL L3-4 LATERAL RECESS DECOMPRESSION, BILATERAL L4 FORAMINOTOMY;  Surgeon: Jessy Oto, MD;  Location: [facility];  Service: Orthopedics;  Laterality:  N/A;  . removal of laparoscopic gastric band  01/02/10   port removed prior to this surgery  . ROTATOR CUFF REPAIR     bilateral  . TONSILLECTOMY    . 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 vertical sleeve gastrectomy N/A     Family Hx:  Family History  Problem Relation Age of Onset  . Colon cancer Neg Hx     Social History:  reports that she has never smoked. She has never used smokeless tobacco. She reports that she does not drink alcohol or use drugs.  Allergies:  Allergies  Allergen Reactions  . Vancomycin Hives and Other (See Comments)    Can spread all over the body. Can spread all over the body.    Medications: Prior to Admission medications   Medication Sig Start Date End Date Taking? Authorizing Provider  ALPRAZolam (XANAX) 0.5 MG tablet TAKE ONE TABLET BY MOUTH TWICE DAILY AS NEEDED. MUST LAST 30 DAYS. 07/01/17  Yes Mikey Kirschner, MD  lisinopril (PRINIVIL,ZESTRIL) 5 MG tablet Take 1 tablet (5 mg total) by mouth daily. 05/12/17  Yes Nilda Simmer, NP  methocarbamol (ROBAXIN) 500 MG tablet Take 500 mg by mouth 3 (three) times daily.  09/17/16  Yes [provider]  phentermine (ADIPEX-P) 37.5 MG tablet Take 1 tablet (37.5 mg total) by mouth daily before breakfast. 05/30/17  Yes Nilda Simmer, NP  Calcium Carbonate (CALCIUM 600 PO) Take by mouth. 2 a day    [provider]  Cholecalciferol (VITAMIN D PO) Take by mouth. Vit d3 2,000 units daily    [provider]  esomeprazole (NEXIUM) 40 MG capsule Take 40 mg by mouth 2 (two) times daily as needed (for acid reflux).  06/29/14   [provider]  gabapentin (NEURONTIN) 300 MG capsule Take 1 capsule (300 mg total) by mouth 3 (three) times daily. Patient not taking: Reported on 07/04/2017 12/20/15   Nilda Simmer, NP  Multiple Vitamin (MULTIVITAMIN) tablet Take 1 tablet by mouth daily. 2 a day    [provider]  nystatin (MYCOSTATIN) 100000 UNIT/ML suspension Take 5 mLs  (500,000 Units total) by mouth 4 (four) times daily. Patient not taking: Reported on 07/17/2017 05/12/17   Nilda Simmer, NP    I have reviewed the patient's current medications.  Labs:  Results for orders placed or performed during the hospital encounter of 07/15/2017 (from the past 48 hour(s))  CBC with Differential/Platelet     Status: Abnormal   Collection Time: 07/07/2017  3:42 AM  Result Value Ref Range   WBC 10.0 4.0 - 10.5 K/uL   RBC 4.12 3.87 - 5.11 MIL/uL   Hemoglobin 12.0 12.0 - 15.0 g/dL   HCT 40.5 36.0 - 46.0 %   MCV 98.3 78.0 - 100.0 fL   MCH 29.1  26.0 - 34.0 pg   MCHC 29.6 (L) 30.0 - 36.0 g/dL   RDW 15.7 (H) 11.5 - 15.5 %   Platelets 255 150 - 400 K/uL   Neutrophils Relative % 45 %   Neutro Abs 4.4 1.7 - 7.7 K/uL   Lymphocytes Relative 51 %   Lymphs Abs 5.2 (H) 0.7 - 4.0 K/uL   Monocytes Relative 2 %   Monocytes Absolute 0.2 0.1 - 1.0 K/uL   Eosinophils Relative 2 %   Eosinophils Absolute 0.2 0.0 - 0.7 K/uL   Basophils Relative 0 %   Basophils Absolute 0.0 0.0 - 0.1 K/uL    Comment: Performed at [facility], [address], [place], [place] [address]  Comprehensive metabolic panel     Status: Abnormal   Collection Time: 07/10/2017  3:42 AM  Result Value Ref Range   Sodium 134 (L) 135 - 145 mmol/L   Potassium 6.0 (H) 3.5 - 5.1 mmol/L   Chloride 105 101 - 111 mmol/L   CO2 15 (L) 22 - 32 mmol/L   Glucose, Bld 337 (H) 65 - 99 mg/dL   BUN 30 (H) 6 - 20 mg/dL   Creatinine, Ser 2.16 (H) 0.44 - 1.00 mg/dL   Calcium 8.1 (L) 8.9 - 10.3 mg/dL   Total Protein 6.3 (L) 6.5 - 8.1 g/dL   Albumin 3.0 (L) 3.5 - 5.0 g/dL   AST 373 (H) 15 - 41 U/L   ALT 186 (H) 14 - 54 U/L   Alkaline Phosphatase 129 (H) 38 - 126 U/L   Total Bilirubin 0.7 0.3 - 1.2 mg/dL   GFR calc non Af Amer 23 (L) >60 mL/min   GFR calc Af Amer 26 (L) >60 mL/min    Comment: (NOTE) The eGFR has been calculated using the CKD EPI equation. This calculation has not been validated in all clinical  situations. eGFR's persistently <60 mL/min signify possible Chronic Kidney Disease.    Anion gap 14 5 - 15    Comment: Performed at [facility], [address] [place], [place] [address]  Troponin I     Status: None   Collection Time: 07/23/2017  3:42 AM  Result Value Ref Range   Troponin I <0.03 <0.03 ng/mL    Comment: Performed at [facility], [address], [place], [place] [address]  Urinalysis, Routine w reflex microscopic     Status: Abnormal   Collection Time: 07/22/2017  3:42 AM  Result Value Ref Range   Color, Urine YELLOW YELLOW   APPearance HAZY (A) CLEAR   Specific Gravity, Urine 1.028 1.005 - 1.030   pH 5.0 5.0 - 8.0   Glucose, UA NEGATIVE NEGATIVE mg/dL   Hgb urine dipstick NEGATIVE NEGATIVE   Bilirubin Urine SMALL (A) NEGATIVE   Ketones, ur NEGATIVE NEGATIVE mg/dL   Protein, ur 100 (A) NEGATIVE mg/dL   Nitrite NEGATIVE NEGATIVE   Leukocytes, UA TRACE (A) NEGATIVE   RBC / HPF 0-5 0 - 5 RBC/hpf   WBC, UA 0-5 0 - 5 WBC/hpf   Bacteria, UA RARE (A) NONE SEEN   Squamous Epithelial / LPF 0-5 0 - 5   Mucus PRESENT    Hyaline Casts, UA PRESENT     Comment: Performed at [facility], [address], [place], [place] [address]  CK     Status: None   Collection Time:   3:42 AM  Result Value Ref Range   Total CK 163 38 - 234 U/L    Comment: Performed at [facility], [address] [place], [place] [address]  Acetaminophen level     Status: Abnormal   Collection Time: 07/28/2017  3:42 AM  Result Value Ref Range   Acetaminophen (Tylenol), Serum <10 (L) 10 - 30 ug/mL    Comment: (NOTE) Therapeutic concentrations vary significantly. A range of 10-30 ug/mL  may be an effective concentration for many patients. However, some  are best treated at concentrations outside of this range. Acetaminophen concentrations >150 ug/mL at 4 hours after ingestion  and >50 ug/mL at 12 hours after ingestion are often associated with  toxic reactions. Performed at [facility],  [address], [place], [place] [address]   Salicylate level     Status: None   Collection Time: 07/06/2017  3:42 AM  Result Value Ref Range   Salicylate Lvl <0.9 2.8 - 30.0 mg/dL    Comment: Performed at [facility], [address] [place], [place] [address]  Ethanol     Status: None   Collection Time: 07/24/2017  3:42 AM  Result Value Ref Range   Alcohol, Ethyl (B) <10 <10 mg/dL    Comment: (NOTE) Lowest detectable limit for serum alcohol is 10 mg/dL. For medical purposes only. Performed at [facility], [address], [place], [place] [address]   Blood gas, arterial ([facility] & [facility] ONLY)     Status: Abnormal   Collection Time: 07/10/2017  3:42 AM  Result Value Ref Range   O2 Content 100.0 L/min   Delivery systems NON-REBREATHER OXYGEN MASK    pH, Arterial 7.133 (LL) 7.350 - 7.450    Comment: CRITICAL RESULT CALLED TO, READ BACK BY AND VERIFIED WITH:  DR. Vevelyn Francois  [id number] KRW    pCO2 arterial 31.4 (L) 32.0 - 48.0 mmHg   pO2, Arterial 82.2 (L) 83.0 - 108.0 mmHg   Bicarbonate 11.2 (L) 20.0 - 28.0 mmol/L   Acid-base deficit 17.2 (H) 0.0 - 2.0 mmol/L   O2 Saturation 91.4 %   Patient temperature 37.0    Collection site RIGHT RADIAL    Drawn by [phone]    Sample type ARTERIAL DRAW    Allens test (pass/fail) PASS PASS    Comment: Performed at [facility], [address], [place], [place] [address]  Ammonia     Status: Abnormal   Collection Time: 07/25/2017  3:42 AM  Result Value Ref Range   Ammonia 97 (H) 9 - 35 umol/L    Comment: Performed at [facility], [address], [place], [place] [address]  Lipase, blood     Status: Abnormal   Collection Time: 07/10/2017  3:42 AM  Result Value Ref Range   Lipase 122 (H) 11 - 51 U/L    Comment: Performed at [facility], [address] [place], [place] [address]  APTT     Status: Abnormal   Collection Time: 07/22/2017  3:42 AM  Result Value Ref Range   aPTT 37 (H) 24 - 36 seconds    Comment:        IF BASELINE aPTT IS ELEVATED, SUGGEST PATIENT  RISK ASSESSMENT BE USED TO DETERMINE APPROPRIATE ANTICOAGULANT THERAPY. Performed at [facility], [address] [place], [place] [address]   Protime-INR     Status: None   Collection Time: 07/07/2017  3:42 AM  Result Value Ref Range   Prothrombin Time 14.9 11.4 - 15.2 seconds   INR 1.18     Comment: Performed at [facility], [address], [place],  [address]  CBG monitoring, ED  Status: Abnormal   Collection Time: 07/20/2017  3:46 AM  Result Value Ref Range   Glucose-Capillary 320 (H) 65 - 99 mg/dL  I-stat troponin, ED     Status: None   Collection Time: 07/15/2017  3:54 AM  Result Value Ref Range   Troponin i, poc 0.00 0.00 - 0.08 ng/mL   Comment 3            Comment: Due to the release kinetics of cTnI, a negative result within the first hours of the onset of symptoms does not rule out myocardial infarction with certainty. If myocardial infarction is still suspected, repeat the test at appropriate intervals.   D-dimer, quantitative (not at [facility])     Status: Abnormal   Collection Time: 07/05/2017  3:55 AM  Result Value Ref Range   D-Dimer, Quant >20.00 (H) 0.00 - 0.50 ug/mL-FEU    Comment: (NOTE) At the manufacturer cut-off of 0.50 ug/mL FEU, this assay has been documented to exclude PE with a sensitivity and negative predictive value of 97 to 99%.  At this time, this assay has not been approved by the FDA to exclude DVT/VTE. Results should be correlated with clinical presentation. Performed at [facility], [address], [place], [place] [address]   I-Stat CG4 Lactic Acid, ED     Status: Abnormal   Collection Time: 07/26/2017  3:56 AM  Result Value Ref Range   Lactic Acid, Venous 7.24 (HH) 0.5 - 1.9 mmol/L   Comment NOTIFIED PHYSICIAN   I-stat chem 8, ed     Status: Abnormal   Collection Time: 07/23/2017  4:00 AM  Result Value Ref Range   Sodium 135 135 - 145 mmol/L   Potassium 6.1 (H) 3.5 - 5.1 mmol/L   Chloride 107 101 - 111 mmol/L   BUN 35 (H) 6 - 20 mg/dL    Creatinine, Ser 2.10 (H) 0.44 - 1.00 mg/dL   Glucose, Bld 315 (H) 65 - 99 mg/dL   Calcium, Ion 1.07 (L) 1.15 - 1.40 mmol/L   TCO2 17 (L) 22 - 32 mmol/L   Hemoglobin 13.3 12.0 - 15.0 g/dL   HCT 39.0 36.0 - 46.0 %  Blood culture (routine x 2)     Status: None (Preliminary result)   Collection Time: 07/12/2017  4:08 AM  Result Value Ref Range   Specimen Description      BLOOD LEFT [place] BOTTLES DRAWN AEROBIC AND ANAEROBIC   Special Requests      Blood Culture results may not be optimal due to an inadequate volume of blood received in culture bottles   Culture      NO GROWTH < 12 HOURS Performed at [facility], [address] [place], [place] [address]    Report Status PENDING   Type and screen [facility]     Status: None   Collection Time: 07/24/2017  4:29 AM  Result Value Ref Range   ABO/RH(D) A POS    Antibody Screen NEG    Sample Expiration       Performed at [facility], [address], [place], [place] [address]   I-Stat CG4 Lactic Acid, ED     Status: Abnormal   Collection Time: 07/13/2017  5:52 AM  Result Value Ref Range   Lactic Acid, Venous 3.83 (HH) 0.5 - 1.9 mmol/L   Comment NOTIFIED PHYSICIAN   CBC with Differential/Platelet     Status: None   Collection Time: 07/08/2017  8:28 AM  Result Value Ref Range  WBC 9.6 4.0 - 10.5 K/uL   RBC 4.33 3.87 - 5.11 MIL/uL   Hemoglobin 12.8 12.0 - 15.0 g/dL   HCT 41.6 36.0 - 46.0 %   MCV 96.1 78.0 - 100.0 fL   MCH 29.6 26.0 - 34.0 pg   MCHC 30.8 30.0 - 36.0 g/dL   RDW 15.4 11.5 - 15.5 %   Platelets 223 150 - 400 K/uL   Neutrophils Relative % 80 %   Neutro Abs 7.7 1.7 - 7.7 K/uL   Lymphocytes Relative 19 %   Lymphs Abs 1.8 0.7 - 4.0 K/uL   Monocytes Relative 1 %   Monocytes Absolute 0.1 0.1 - 1.0 K/uL   Eosinophils Relative 0 %   Eosinophils Absolute 0.0 0.0 - 0.7 K/uL   Basophils Relative 0 %   Basophils Absolute 0.0 0.0 - 0.1 K/uL    Comment: Performed at [facility], [address], [place], [place]  [address]  Comprehensive metabolic panel     Status: Abnormal   Collection Time: 08/01/2017  8:28 AM  Result Value Ref Range   Sodium 137 135 - 145 mmol/L   Potassium 4.8 3.5 - 5.1 mmol/L    Comment: DELTA CHECK NOTED   Chloride 107 101 - 111 mmol/L   CO2 18 (L) 22 - 32 mmol/L   Glucose, Bld 154 (H) 65 - 99 mg/dL   BUN 33 (H) 6 - 20 mg/dL   Creatinine, Ser 2.04 (H) 0.44 - 1.00 mg/dL   Calcium 7.4 (L) 8.9 - 10.3 mg/dL   Total Protein 5.8 (L) 6.5 - 8.1 g/dL   Albumin 2.9 (L) 3.5 - 5.0 g/dL   AST 631 (H) 15 - 41 U/L   ALT 274 (H) 14 - 54 U/L   Alkaline Phosphatase 158 (H) 38 - 126 U/L   Total Bilirubin 1.0 0.3 - 1.2 mg/dL   GFR calc non Af Amer 24 (L) >60 mL/min   GFR calc Af Amer 28 (L) >60 mL/min    Comment: (NOTE) The eGFR has been calculated using the CKD EPI equation. This calculation has not been validated in all clinical situations. eGFR's persistently <60 mL/min signify possible Chronic Kidney Disease.    Anion gap 12 5 - 15    Comment: Performed at [facility], [address], [place], [place] [address]  Troponin I     Status: Abnormal   Collection Time: 07/06/2017  8:28 AM  Result Value Ref Range   Troponin I 0.05 (HH) <0.03 ng/mL    Comment: CRITICAL RESULT CALLED TO, READ BACK BY AND VERIFIED WITH: KEMP,C AT 9:20AM ON 07/16/2017 BY Derrill Memo Performed at [facility], [address], [place], [place] [address]   Lactic acid, plasma     Status: Abnormal   Collection Time: 07/05/2017  8:28 AM  Result Value Ref Range   Lactic Acid, Venous 5.0 (HH) 0.5 - 1.9 mmol/L    Comment: CRITICAL RESULT CALLED TO, READ BACK BY AND VERIFIED WITH: BETHEL,S AT 9:10AM ON 07/24/2017 BY [facility] Performed at [facility], [address], [place], [place] [address]   Glucose, capillary     Status: Abnormal   Collection Time: 07/06/2017 10:47 AM  Result Value Ref Range   Glucose-Capillary 162 (H) 65 - 99 mg/dL   Comment 1 Capillary Specimen    Comment 2 Notify RN   MRSA PCR Screening      Status: None   Collection Time: 07/08/2017 11:35 AM  Result Value Ref Range   MRSA by PCR  NEGATIVE NEGATIVE    Comment:        The GeneXpert MRSA Assay (FDA approved for NASAL specimens only), is one component of a comprehensive MRSA colonization surveillance program. It is not intended to diagnose MRSA infection nor to guide or monitor treatment for MRSA infections. Performed at [facility], [place] [address], [place], [place] [address]   I-STAT 3, arterial blood gas (G3+)     Status: Abnormal   Collection Time:   1:40 PM  Result Value Ref Range   pH, Arterial 7.056 (LL) 7.350 - 7.450   pCO2 arterial 50.6 (H) 32.0 - 48.0 mmHg   pO2, Arterial 173.0 (H) 83.0 - 108.0 mmHg   Bicarbonate 14.3 (L) 20.0 - 28.0 mmol/L   TCO2 16 (L) 22 - 32 mmol/L   O2 Saturation 99.0 %   Acid-base deficit 16.0 (H) 0.0 - 2.0 mmol/L   Patient temperature 97.5 F    Collection site ARTERIAL LINE    Drawn by Nurse    Sample type ARTERIAL    Comment NOTIFIED PHYSICIAN   Magnesium     Status: None   Collection Time: 07/03/2017  3:14 PM  Result Value Ref Range   Magnesium 2.1 1.7 - 2.4 mg/dL    Comment: Performed at [facility], [place] [address] [place], [place] [address]  Phosphorus     Status: Abnormal   Collection Time: 07/05/2017  3:14 PM  Result Value Ref Range   Phosphorus 6.2 (H) 2.5 - 4.6 mg/dL    Comment: Performed at [facility] [address], [place], [place] [address]  Heparin level (unfractionated)     Status: Abnormal   Collection Time: 08/01/2017  3:14 PM  Result Value Ref Range   Heparin Unfractionated 0.88 (H) 0.30 - 0.70 IU/mL    Comment: (NOTE) If heparin results are below expected values, and patient dosage has  been confirmed, suggest follow up testing of antithrombin III levels. Performed at [facility], [place] [address] [place], [place] [address]   Comprehensive metabolic panel     Status: Abnormal   Collection Time: 07/20/2017  3:14 PM  Result Value  Ref Range   Sodium 141 135 - 145 mmol/L   Potassium 6.0 (H) 3.5 - 5.1 mmol/L   Chloride 111 101 - 111 mmol/L   CO2 16 (L) 22 - 32 mmol/L   Glucose, Bld 206 (H) 65 - 99 mg/dL   BUN 32 (H) 6 - 20 mg/dL   Creatinine, Ser 2.47 (H) 0.44 - 1.00 mg/dL   Calcium 5.8 (LL) 8.9 - 10.3 mg/dL    Comment: CRITICAL RESULT CALLED TO, READ BACK BY AND VERIFIED WITH: J POOLE,RN 1704 07/12/2017 D BRADLEY    Total Protein 4.1 (L) 6.5 - 8.1 g/dL   Albumin 2.1 (L) 3.5 - 5.0 g/dL   AST 702 (H) 15 - 41 U/L   ALT 232 (H) 14 - 54 U/L   Alkaline Phosphatase 140 (H) 38 - 126 U/L   Total Bilirubin 0.8 0.3 - 1.2 mg/dL   GFR calc non Af Amer 19 (L) >60 mL/min   GFR calc Af Amer 22 (L) >60 mL/min    Comment: (NOTE) The eGFR has been calculated using the CKD EPI equation. This calculation has not been validated in all clinical situations. eGFR's persistently <60 mL/min signify possible Chronic Kidney Disease.    Anion gap 14 5 - 15    Comment: Performed at [facility], [address] [place], [place] [address]  Glucose, capillary     Status: Abnormal   Collection Time: 07/17/2017  4:52 PM  Result Value Ref Range   Glucose-Capillary 194 (H) 65 - 99 mg/dL   Comment 1 Arterial Specimen      ROS:  Review of systems not obtained due to patient factors.  Physical Exam: Vitals:   07/20/2017 1745 07/08/2017 1800  BP: (!) 133/107 (!) 120/109  Pulse: 86   Resp: (!) 30 (!) 31  Temp: 97.7 F (36.5 C) 97.7 F (36.5 C)  SpO2: (!) 79%      General: Sedated on the vent-is having some spontaneous eye movements-oozing from lines HEENT: Spontaneous eye movements.  Pupils are reactive, mucous membranes moist  Neck: No JVD Heart: Regular rate and rhythm Lungs: Coarse breath sounds bilaterally Abdomen: Distended, nontender Extremities: Only very minimal edema Skin: Warm and dry Neuro: Sedated on vent  Assessment/Plan: 67 year old white female with chronic pain history, hypertension.  On many comfort/sedating  medications as well as lisinopril.  She presents with sepsis type syndrome with hypotension, elevated lactate and AKI 1.Renal- AKI in the setting of hypotension-sepsis syndrome there is no urine output for analysis.  Creatinine normal at baseline.  Consistent with ATN from hemodynamic injury also with an ACE inhibitor on board.  Now with refractory acidosis and hyperkalemia requiring CRRT.  Appreciate VDS assistance with catheter, will initiate CRRT this evening.  Given her acidosis will do pre-and post fluid as bicarb-no heparin given oozing from lines 2. Hypertension/volume  -is not overloaded.  Receiving IV fluids per sepsis protocol.  Will be able to bolus outside of the CRRT -will aim to keep her 125+ per hour 3. Anemia  -likely hemoconcentrated-not to a critical level. 4.  Hyperkalemia-give verbal orders to give calcium, insulin, D50 and 1 amp of bicarbonate.  Dialysis should correct 5.  ID-started on Zosyn and Zyvox per CCM-not sure if source is known   Yula Crotwell A 07/26/2017, 6:23 PM

## 2017-07-18 NOTE — ED Triage Notes (Signed)
EMS called out for unresponsive and snoring respirations. Upon arrival family reports pt was in bathroom "earlier", family fell asleep, woke up later and pt was in bathroom on toilet with snoring respirations and unresponsive. Upon EMS arrival pt was unresponsive with snoring decreased respirations.  EMS administered 2mg  Narcan intranasal, aroused a little then went unresponsive again- 1 mg Narcan IV administered- pt aroused  With inappropriate speech and restless.

## 2017-07-18 NOTE — Progress Notes (Signed)
Patient with persisting unstable condition. All critical values reported to MDs and MD at bs frequently for interventions and assessments.

## 2017-07-19 LAB — HEPATIC FUNCTION PANEL
ALK PHOS: 112 U/L (ref 38–126)
ALT: 338 U/L — AB (ref 14–54)
AST: 858 U/L — AB (ref 15–41)
Albumin: 1.5 g/dL — ABNORMAL LOW (ref 3.5–5.0)
BILIRUBIN DIRECT: 0.2 mg/dL (ref 0.1–0.5)
BILIRUBIN INDIRECT: 0.6 mg/dL (ref 0.3–0.9)
Total Bilirubin: 0.8 mg/dL (ref 0.3–1.2)
Total Protein: 3.2 g/dL — ABNORMAL LOW (ref 6.5–8.1)

## 2017-07-19 LAB — POCT I-STAT 3, ART BLOOD GAS (G3+)
ACID-BASE DEFICIT: 14 mmol/L — AB (ref 0.0–2.0)
Bicarbonate: 13.2 mmol/L — ABNORMAL LOW (ref 20.0–28.0)
O2 SAT: 97 %
Patient temperature: 97.7
TCO2: 14 mmol/L — AB (ref 22–32)
pCO2 arterial: 35.5 mmHg (ref 32.0–48.0)
pH, Arterial: 7.176 — CL (ref 7.350–7.450)
pO2, Arterial: 109 mmHg — ABNORMAL HIGH (ref 83.0–108.0)

## 2017-07-19 LAB — PHOSPHORUS: PHOSPHORUS: 8.6 mg/dL — AB (ref 2.5–4.6)

## 2017-07-19 LAB — CBC
HCT: 32.8 % — ABNORMAL LOW (ref 36.0–46.0)
HEMOGLOBIN: 9.5 g/dL — AB (ref 12.0–15.0)
MCH: 28.9 pg (ref 26.0–34.0)
MCHC: 29 g/dL — ABNORMAL LOW (ref 30.0–36.0)
MCV: 99.7 fL (ref 78.0–100.0)
Platelets: 169 10*3/uL (ref 150–400)
RBC: 3.29 MIL/uL — AB (ref 3.87–5.11)
RDW: 15.6 % — ABNORMAL HIGH (ref 11.5–15.5)
WBC: 9.7 10*3/uL (ref 4.0–10.5)

## 2017-07-19 LAB — MAGNESIUM: MAGNESIUM: 1.9 mg/dL (ref 1.7–2.4)

## 2017-07-19 LAB — RENAL FUNCTION PANEL
ALBUMIN: 1.5 g/dL — AB (ref 3.5–5.0)
Anion gap: 27 — ABNORMAL HIGH (ref 5–15)
BUN: 31 mg/dL — AB (ref 6–20)
CHLORIDE: 105 mmol/L (ref 101–111)
CO2: 13 mmol/L — AB (ref 22–32)
Calcium: 5.3 mg/dL — CL (ref 8.9–10.3)
Creatinine, Ser: 3.09 mg/dL — ABNORMAL HIGH (ref 0.44–1.00)
GFR calc Af Amer: 17 mL/min — ABNORMAL LOW (ref >60)
GFR calc non Af Amer: 15 mL/min — ABNORMAL LOW (ref >60)
GLUCOSE: 199 mg/dL — AB (ref 65–99)
PHOSPHORUS: 8.6 mg/dL — AB (ref 2.5–4.6)
POTASSIUM: 5.5 mmol/L — AB (ref 3.5–5.1)
Sodium: 145 mmol/L (ref 135–145)

## 2017-07-19 LAB — URINE CULTURE: CULTURE: NO GROWTH

## 2017-07-19 LAB — CORTISOL: Cortisol, Plasma: 23.5 ug/dL

## 2017-07-19 LAB — HEPARIN LEVEL (UNFRACTIONATED): HEPARIN UNFRACTIONATED: 0.1 [IU]/mL — AB (ref 0.30–0.70)

## 2017-07-19 MED ORDER — MORPHINE 100MG IN NS 100ML (1MG/ML) PREMIX INFUSION
1.0000 mg/h | INTRAVENOUS | Status: DC
  Administered 2017-07-19: 2 mg/h via INTRAVENOUS
  Filled 2017-07-19: qty 100

## 2017-07-19 MED ORDER — ALBUMIN HUMAN 5 % IV SOLN
INTRAVENOUS | Status: AC
  Filled 2017-07-19: qty 250

## 2017-07-19 MED ORDER — ALBUMIN HUMAN 5 % IV SOLN: 12.5000 g | Freq: Once | INTRAVENOUS | Status: DC

## 2017-07-19 MED ORDER — SODIUM CHLORIDE 0.9 % IV SOLN
1.0000 g | Freq: Two times a day (BID) | INTRAVENOUS | Status: DC
  Filled 2017-07-19 (×2): qty 1

## 2017-07-20 LAB — CULTURE, RESPIRATORY W GRAM STAIN

## 2017-07-20 LAB — CULTURE, RESPIRATORY

## 2017-07-22 LAB — GLUCOSE, CAPILLARY: GLUCOSE-CAPILLARY: 149 mg/dL — AB (ref 65–99)

## 2017-07-23 LAB — CULTURE, BLOOD (ROUTINE X 2)
CULTURE: NO GROWTH
Culture: NO GROWTH
Culture: NO GROWTH
SPECIAL REQUESTS: ADEQUATE
Special Requests: ADEQUATE

## 2017-07-23 MED FILL — Medication: Qty: 1 | Status: AC

## 2017-07-29 ENCOUNTER — Telehealth: Payer: Self-pay

## 2017-07-29 NOTE — Telephone Encounter (Signed)
On 07/29/17 I received a d/c from [facility] (original). The d/c is for burial. The patient is a patient of Doctor Nelda Marseille.   The d/c will be taken to [facility] for signature.  On 07/30/17 I received the d/c back from Doctor Nelda Marseille.  I got the d/c ready and called the funeral home to let them know I mailed the d/c to vital records per the funeral home request.

## 2017-08-02 NOTE — Progress Notes (Addendum)
PCCM Interval Note   Patient with progressive hypotension on max dosage of giapreza, levophed, and vasopressin. Cortisol 23.5. Currently in acute renal failure with anion gap metabolic acidosis, has not tolerated CRRT due to profound hypotension and instability.   At [place] at beside. Daughter, son, and husband voice that patient would not want to be kept alive by machines. Family would like to continue current support (without escalating care), code status updated in EMR, with goal of comfort, will start Morphine gtt.  At 0310 decision made (by son/daugher/husband) to terminally extubate with family and Doristine Bosworth at bedside. 3833 patient pronounced.   Hayden Pedro, AGACNP-BC [facility]  Pgr: [phone]  PCCM Pgr: [phone]

## 2017-08-02 NOTE — Procedures (Signed)
Extubation Procedure Note  Patient Details:   Name: Natalie Dodson DOB: [birth year] MRN: [id number]   Airway Documentation:    Vent end date: 08-03-17 Vent end time: 0315   Evaluation  O2 sats: transiently fell during during procedure Complications: No apparent complications Patient Terminal extuabtation Bilateral Breath Sounds: Rhonchi, Diminished    Patient was terminally extubated per MD order.    No  Carrington Clamp A 08/03/2017, 3:22 AM

## 2017-08-02 NOTE — Progress Notes (Signed)
Contacted Dr. Moshe Cipro about pt. not being able to tolerate CRRT. SBP dropped tin the 40's. Orders received.

## 2017-08-02 NOTE — Progress Notes (Signed)
Pharmacy Antibiotic Note  Natalie Dodson is a 67 y.o. female admitted on 07/03/2017 with pneumonia.  Pharmacy has been consulted for meropenem dosing, h/o ESBL  Plan: Meropenem 1gm IV q12 hours F/u renal function, cultures and clinical course  Height: 5\' 5"  (165.1 cm) Weight: 198 lb 6.6 oz (90 kg) IBW/kg (Calculated) : 57  Temp (24hrs), Avg:96.4 F (35.8 C), Min:92.7 F (33.7 C), Max:98.4 F (36.9 C)  Recent Labs  Lab 07/29/2017 0342 07/07/2017 0356 07/06/2017 0400 07/25/2017 0552 07/26/2017 0828 07/16/2017 1514  WBC 10.0  --   --   --  9.6  --   CREATININE 2.16*  --  2.10*  --  2.04* 2.47*  LATICACIDVEN  --  7.24*  --  3.83* 5.0*  --     Estimated Creatinine Clearance: 25.2 mL/min (A) (by C-G formula based on SCr of 2.47 mg/dL (H)).    Allergies  Allergen Reactions  . Vancomycin Hives and Other (See Comments)    Can spread all over the body. Can spread all over the body.    Antimicrobials this admission: Zyvox 5/17 x 1 Zosyn 5/17 >> 5/18 Meropenem  5/18>>  Microbiology results:  5/17 UCx: being drawn 5/17 Sputum: being drawn   Thank you for allowing pharmacy to be a part of this patient's care.  Beverlee Nims, PharmD August 17, 2017 2:13 AM

## 2017-08-02 NOTE — Death Summary Note (Signed)
 DEATH SUMMARY   Patient Details  Name: Natalie Dodson MRN: [id number] DOB: [birth year]  Admission/Discharge Information   Admit Date:  Jul 29, 2017  Date of Death: Date of Death: 07-30-17  Time of Death: Time of Death: 0323  Length of Stay: 0  Referring Physician: Mikey Kirschner, MD   Reason(s) for Hospitalization    Diagnoses  Preliminary cause of death:   Acute hypercarbic respiratory failure Secondary Diagnoses (including complications and co-morbidities):  Active Problems:   Shock ([place])   Acute respiratory failure with hypoxemia (HCC)   Acute renal failure (HCC) Septic shock ARDS  Brief Hospital Course (including significant findings, care, treatment, and services provided and events leading to death)  67 year old female with PMH below presenting with AMS and respiratory failure from [facility].  The patient is obtunded upon arrival and I am unable to obtain history, no family bedside.  From EDP notes, patient was found slumped over on the toilet.  Patient was taken to the ER in [facility] where she was given narcan and woke up.  Evidently she has chronic pain and is on chronic pain medications.   Patient had severe acidosis and renal failure.  Family was not sure if patient would want this level of aggressive care and but short dialysis was acceptable.  Patient was made LCB at this point.  Patient was started on CRRT but BP was not tolerating dialysis.    Overnight team spoke with family, no further escalation of care but patient continued to deteriorate.  At 3:10 AM family decided to withdraw and extubate, morphine started and patient extubated to expire 12 minutes later comfortably with the family bedside.  Pertinent Labs and Studies  Significant Diagnostic Studies Ct Abdomen Pelvis Wo Contrast  Result Date: 29-Jul-2017 CLINICAL DATA:  Acute onset of altered level of consciousness. Patient is semi-responsive. Sepsis. Abdominal distention. EXAM: CT ABDOMEN AND PELVIS WITHOUT CONTRAST  TECHNIQUE: Multidetector CT imaging of the abdomen and pelvis was performed following the standard protocol without IV contrast. COMPARISON:  CT of the abdomen and pelvis performed 11/21/2009 FINDINGS: Lower chest: There is partial consolidation of the right lower lobe, compatible with pneumonia. The visualized portions of the mediastinum are unremarkable. Hepatobiliary: The liver is unremarkable in appearance. The gallbladder is unremarkable in appearance. The common bile duct remains normal in caliber. Pancreas: The pancreas is within normal limits. Spleen: The spleen is unremarkable in appearance. Adrenals/Urinary Tract: The adrenal glands are unremarkable in appearance. The kidneys are within normal limits. There is no evidence of hydronephrosis. No renal or ureteral stones are identified. No perinephric stranding is seen. Stomach/Bowel: The patient is status post sleeve gastrectomy. The stomach is otherwise unremarkable. The small bowel is within normal limits. The patient is status post appendectomy. The colon is largely distended with fluid and air, and is grossly unremarkable in appearance. Vascular/Lymphatic: Scattered calcification is seen along the abdominal aorta and its branches. The abdominal aorta is otherwise grossly unremarkable. The inferior vena cava is grossly unremarkable. No retroperitoneal lymphadenopathy is seen. No pelvic sidewall lymphadenopathy is identified. Reproductive: The bladder is mildly distended and grossly unremarkable. The patient is status post hysterectomy. No suspicious adnexal masses are seen. Other: No additional soft tissue abnormalities are seen. Musculoskeletal: No acute osseous abnormalities are identified. Decompression is noted at the lower lumbar spine. There is chronic osseous fusion at L5-S1. The visualized musculature is unremarkable in appearance. IMPRESSION: 1. Colon largely distended with fluid and air; this may correspond to the patient's abdominal distention.  No evidence for obstruction. 2. Right lower lobe pneumonia. Aortic Atherosclerosis (ICD10-I70.0). Electronically Signed   By: Garald Balding M.D.   On: 07/15/2017 05:49   Dg Abd 1 View  Result Date: 07/23/2017 CLINICAL DATA:  Status post OG tube placement. EXAM: ABDOMEN - 1 VIEW COMPARISON:  None. FINDINGS: OG tube is coiled in the stomach. Both the tip and side-port in the stomach. IMPRESSION: As above. Electronically Signed   By: Inge Rise M.D.   On:  12:45   Ct Head Wo Contrast  Result Date:  CLINICAL DATA:  67 y/o  F; encephalopathy. EXAM: CT HEAD WITHOUT CONTRAST TECHNIQUE: Contiguous axial images were obtained from the base of the skull through the vertex without intravenous contrast. COMPARISON:  None. FINDINGS: Brain: No evidence of acute infarction, hemorrhage, hydrocephalus, extra-axial collection or mass lesion/mass effect. Vascular: No hyperdense vessel or unexpected calcification. Skull: Normal. Negative for fracture or focal lesion. Sinuses/Orbits: No acute finding. Other: None. IMPRESSION: No acute intracranial abnormality. Unremarkable CT of the head for age. Electronically Signed   By: Kristine Garbe M.D.   On: 07/05/2017 05:49   Dg Chest Port 1 View  Result Date: 07/14/2017 CLINICAL DATA:  Reason for exam: encounter for central line placement No known heart or lung conditions, no known heart or lung surgeries. EXAM: PORTABLE CHEST 1 VIEW COMPARISON:  07/22/2017 FINDINGS: Endotracheal tube 2.5 cm from carina. NG tube extends the stomach. Central venous line tip in distal SVC. Low lung volumes. Low lung volumes IMPRESSION: 1. Stable support apparatus. 2. Low lung volumes. 3. No pneumothorax. Electronically Signed   By: Suzy Bouchard M.D.   On: 07/02/2017 18:14   Dg Chest Port 1 View  Result Date: 07/20/2017 CLINICAL DATA:  Status post central line and ET tube placement today. EXAM: PORTABLE CHEST 1 VIEW COMPARISON:  Single-view of the chest  earlier today. FINDINGS: Endotracheal tube is in place with the tip in good position at the level of the clavicular heads 3 cm above the carina. Left subclavian catheter tip projects in the lower superior vena cava. OG tube courses into the stomach and below the inferior margin of the film. Mild bibasilar atelectasis is seen. Lungs are otherwise clear. No pneumothorax or pleural effusion. Heart size is normal. Aortic atherosclerosis noted. IMPRESSION: Support tubes and lines project in good position.  No pneumothorax. Mild bibasilar atelectasis. Electronically Signed   By: Inge Rise M.D.   On: 07/13/2017 12:46   Dg Chest Portable 1 View  Result Date: 07/22/2017 CLINICAL DATA:  67 y/o F; unresponsive patient with low oxygen saturation and abdominal distention. EXAM: PORTABLE CHEST 1 VIEW COMPARISON:  10/21/2013 chest radiograph FINDINGS: Normal cardiac silhouette. Aortic atherosclerosis with calcification. Low lung volumes accentuate pulmonary markings. No focal consolidation. No pleural effusion or pneumothorax. Anterior cervical discectomy and fusion hardware. Post right shoulder rotator cuff repair. IMPRESSION: Low lung volumes.  No acute pulmonary process identified. Electronically Signed   By: Kristine Garbe M.D.   On: 07/07/2017 04:13   Dg Abd Portable 2 Views  Result Date: 07/23/2017 CLINICAL DATA:  67 y/o F; unresponsive patient with abdominal distention. EXAM: PORTABLE ABDOMEN - 2 VIEW COMPARISON:  None. FINDINGS: Mild diffuse air-filled dilatation of large bowel. Degenerative changes of the lumbar spine and mild levocurvature. Surgical sutures project over the right lower quadrant. Mild osteoarthrosis of the hip joints. IMPRESSION: Mild diffuse air-filled distention of large bowel may represent constipation, distal obstruction, or pseudo-obstruction. Electronically Signed   By: Edgardo Roys.D.  On: 07/02/2017 04:16    Microbiology No results found for this or any  previous visit (from the past 240 hour(s)).  Lab Basic Metabolic Panel: No results for input(s): NA, K, CL, CO2, GLUCOSE, BUN, CREATININE, CALCIUM, MG, PHOS in the last 168 hours. Liver Function Tests: No results for input(s): AST, ALT, ALKPHOS, BILITOT, PROT, ALBUMIN in the last 168 hours. No results for input(s): LIPASE, AMYLASE in the last 168 hours. No results for input(s): AMMONIA in the last 168 hours. CBC: No results for input(s): WBC, NEUTROABS, HGB, HCT, MCV, PLT in the last 168 hours. Cardiac Enzymes: No results for input(s): CKTOTAL, CKMB, CKMBINDEX, TROPONINI in the last 168 hours. Sepsis Labs: No results for input(s): PROCALCITON, WBC, LATICACIDVEN in the last 168 hours.  Procedures/Operations     YACOUB,WESAM 07/29/2017, 2:35 PM

## 2017-08-02 NOTE — Progress Notes (Signed)
Chaplain came to unit by way of Spiritual Care Pager. Nurse Secretary shared that the Pt. Is actively dying.  Upon reporting to unit Chaplain discovered that the family is well supported by their Doristine Bosworth.  The Pt.'s critical illness is most unexpected by the family. They are understandably emotional and in need of the emotional spiritual support - grateful for the presence of their Pastor.

## 2017-08-02 NOTE — Progress Notes (Signed)
Death Note: Pt. DNR, with family present at beside. Pt. Asystole on the HR monitor, No Lung or heart sounds auscultated. Cardiac time of death 73. Verified by NP.

## 2017-08-02 NOTE — Progress Notes (Signed)
Wasted 90cc of morphine in sink with RN [employer]

## 2017-08-02 DEATH — deceased

## 2017-11-12 ENCOUNTER — Ambulatory Visit: Payer: BC Managed Care – PPO | Admitting: Nurse Practitioner
